# Patient Record
Sex: Female | Born: 1950 | Race: White | Hispanic: No | Smoking: Former smoker
Health system: Southern US, Community
[De-identification: ages and names within clinical notes are randomized; demographics above are authoritative.]

## PROBLEM LIST (undated history)

## (undated) DIAGNOSIS — Z8719 Personal history of other diseases of the digestive system: Secondary | ICD-10-CM

## (undated) DIAGNOSIS — I739 Peripheral vascular disease, unspecified: Secondary | ICD-10-CM

## (undated) DIAGNOSIS — M1990 Unspecified osteoarthritis, unspecified site: Secondary | ICD-10-CM

## (undated) DIAGNOSIS — I10 Essential (primary) hypertension: Secondary | ICD-10-CM

## (undated) DIAGNOSIS — B019 Varicella without complication: Secondary | ICD-10-CM

## (undated) DIAGNOSIS — Z8711 Personal history of peptic ulcer disease: Secondary | ICD-10-CM

## (undated) DIAGNOSIS — D649 Anemia, unspecified: Secondary | ICD-10-CM

## (undated) DIAGNOSIS — E1140 Type 2 diabetes mellitus with diabetic neuropathy, unspecified: Secondary | ICD-10-CM

## (undated) DIAGNOSIS — I4891 Unspecified atrial fibrillation: Secondary | ICD-10-CM

## (undated) DIAGNOSIS — E119 Type 2 diabetes mellitus without complications: Secondary | ICD-10-CM

## (undated) HISTORY — DX: Unspecified atrial fibrillation: I48.91

## (undated) HISTORY — PX: ADENOIDECTOMY: SUR15

## (undated) HISTORY — DX: Type 2 diabetes mellitus without complications: E11.9

## (undated) HISTORY — PX: OOPHORECTOMY: SHX86

## (undated) HISTORY — DX: Personal history of peptic ulcer disease: Z87.11

## (undated) HISTORY — PX: ABDOMINAL HYSTERECTOMY: SHX81

## (undated) HISTORY — DX: Peripheral vascular disease, unspecified: I73.9

## (undated) HISTORY — DX: Type 2 diabetes mellitus with diabetic neuropathy, unspecified: E11.40

## (undated) HISTORY — PX: CATARACT EXTRACTION: SUR2

## (undated) HISTORY — PX: CARDIOVERSION: SHX1299

## (undated) HISTORY — PX: FOOT SURGERY: SHX648

## (undated) HISTORY — DX: Personal history of other diseases of the digestive system: Z87.19

## (undated) HISTORY — DX: Varicella without complication: B01.9

## (undated) HISTORY — PX: ENDOVENOUS ABLATION SAPHENOUS VEIN W/ LASER: SUR449

---

## 2004-10-22 ENCOUNTER — Ambulatory Visit: Payer: Self-pay | Admitting: Podiatry

## 2005-06-24 ENCOUNTER — Ambulatory Visit: Payer: Self-pay | Admitting: Internal Medicine

## 2005-06-29 ENCOUNTER — Ambulatory Visit: Payer: Self-pay | Admitting: Internal Medicine

## 2006-08-22 ENCOUNTER — Ambulatory Visit: Payer: Self-pay | Admitting: Family

## 2007-04-23 ENCOUNTER — Ambulatory Visit: Payer: Self-pay | Admitting: Specialist

## 2007-05-04 ENCOUNTER — Ambulatory Visit: Payer: Self-pay | Admitting: Specialist

## 2007-08-02 ENCOUNTER — Ambulatory Visit: Payer: Self-pay | Admitting: Internal Medicine

## 2007-11-21 ENCOUNTER — Ambulatory Visit: Payer: Self-pay | Admitting: Internal Medicine

## 2008-10-19 ENCOUNTER — Emergency Department: Payer: Self-pay | Admitting: Emergency Medicine

## 2009-01-21 ENCOUNTER — Ambulatory Visit: Payer: Self-pay | Admitting: Internal Medicine

## 2009-05-08 ENCOUNTER — Ambulatory Visit: Payer: Self-pay | Admitting: Unknown Physician Specialty

## 2009-07-27 ENCOUNTER — Ambulatory Visit: Payer: Self-pay | Admitting: Unknown Physician Specialty

## 2009-10-28 ENCOUNTER — Ambulatory Visit: Payer: Self-pay | Admitting: Internal Medicine

## 2009-10-30 ENCOUNTER — Inpatient Hospital Stay: Payer: Self-pay | Admitting: Internal Medicine

## 2011-08-15 ENCOUNTER — Other Ambulatory Visit: Payer: Self-pay | Admitting: *Deleted

## 2011-08-15 MED ORDER — METOPROLOL TARTRATE 100 MG PO TABS: 100.0000 mg | ORAL_TABLET | Freq: Two times a day (BID) | ORAL | Status: DC

## 2011-08-15 NOTE — Telephone Encounter (Signed)
Received faxed refill request from pharmacy. Is it okay to refill metoprolol?

## 2011-09-01 ENCOUNTER — Other Ambulatory Visit: Payer: Self-pay | Admitting: Internal Medicine

## 2011-09-05 MED ORDER — INSULIN ASPART PROT & ASPART (70-30 MIX) 100 UNIT/ML ~~LOC~~ SUSP: 45.0000 [IU] | Freq: Two times a day (BID) | SUBCUTANEOUS | Status: AC

## 2012-01-06 ENCOUNTER — Other Ambulatory Visit: Payer: Self-pay | Admitting: Internal Medicine

## 2012-05-14 ENCOUNTER — Other Ambulatory Visit: Payer: Self-pay | Admitting: *Deleted

## 2012-05-14 MED ORDER — METOPROLOL TARTRATE 100 MG PO TABS: 100.0000 mg | ORAL_TABLET | Freq: Two times a day (BID) | ORAL | Status: DC

## 2012-11-02 ENCOUNTER — Ambulatory Visit: Payer: Self-pay | Admitting: Internal Medicine

## 2012-11-02 ENCOUNTER — Telehealth: Payer: Self-pay | Admitting: Internal Medicine

## 2012-11-02 NOTE — Telephone Encounter (Signed)
This pt was directed to urgent care, as she has not been seen in this office.

## 2012-11-02 NOTE — Telephone Encounter (Signed)
Patient Information:  Caller Name: [name]  Phone: [phone]  Patient: [name], [name]  Gender: Female  DOB: 11-03-1951  Age: 61 Years  PCP: Duncan Dull (Adults only)   Symptoms  Reason For Call & Symptoms: has had sinus infection/sx for 1 week, asking for a Zpac, has soreness around right eye  Reviewed Health History In EMR: Yes  Reviewed Medications In EMR: Yes  Reviewed Allergies In EMR: Yes  Reviewed Surgeries / Procedures: Yes  Date of Onset of Symptoms: 10/26/2012  Treatments Tried: OTC  Treatments Tried Worked: No  Guideline(s) Used:  Sinus Pain and Congestion  Disposition Per Guideline:   See Today in Office  Reason For Disposition Reached:   [place]ar[place]  Advice Given:  N/A  Office Follow Up:  Does the office need to follow up with this patient?: No  Instructions For The Office: N/A  Appointment Scheduled:  11/02/2012 15:30:00

## 2012-11-02 NOTE — Telephone Encounter (Signed)
FYI, Pt has appt at 3:30

## 2012-11-09 ENCOUNTER — Other Ambulatory Visit: Payer: Self-pay

## 2012-11-15 ENCOUNTER — Other Ambulatory Visit: Payer: Self-pay

## 2012-11-16 ENCOUNTER — Other Ambulatory Visit: Payer: Self-pay | Admitting: Internal Medicine

## 2014-08-29 ENCOUNTER — Telehealth: Payer: Self-pay

## 2014-08-29 NOTE — Telephone Encounter (Signed)
The patient called and stated she has an appointment with her eye doctor, but needs a [employer] ref to go see this doctor.  I explained to the patient that she was not a current patient here (after reviewing her chart) but i told her I would try to submit a form to [employer] to allow her to see the eye doctor.   [employer] form faxed in.

## 2015-06-15 LAB — HM DIABETES EYE EXAM

## 2015-07-13 ENCOUNTER — Encounter: Payer: Self-pay | Admitting: *Deleted

## 2015-07-13 DIAGNOSIS — Z91040 Latex allergy status: Secondary | ICD-10-CM | POA: Diagnosis not present

## 2015-07-13 DIAGNOSIS — E780 Pure hypercholesterolemia: Secondary | ICD-10-CM | POA: Diagnosis not present

## 2015-07-13 DIAGNOSIS — Z791 Long term (current) use of non-steroidal anti-inflammatories (NSAID): Secondary | ICD-10-CM | POA: Diagnosis not present

## 2015-07-13 DIAGNOSIS — M1990 Unspecified osteoarthritis, unspecified site: Secondary | ICD-10-CM | POA: Diagnosis not present

## 2015-07-13 DIAGNOSIS — I4891 Unspecified atrial fibrillation: Secondary | ICD-10-CM | POA: Diagnosis not present

## 2015-07-13 DIAGNOSIS — H9190 Unspecified hearing loss, unspecified ear: Secondary | ICD-10-CM | POA: Diagnosis not present

## 2015-07-13 DIAGNOSIS — Z87891 Personal history of nicotine dependence: Secondary | ICD-10-CM | POA: Diagnosis not present

## 2015-07-13 DIAGNOSIS — H2512 Age-related nuclear cataract, left eye: Secondary | ICD-10-CM | POA: Diagnosis present

## 2015-07-13 DIAGNOSIS — Z79899 Other long term (current) drug therapy: Secondary | ICD-10-CM | POA: Diagnosis not present

## 2015-07-13 DIAGNOSIS — I10 Essential (primary) hypertension: Secondary | ICD-10-CM | POA: Diagnosis not present

## 2015-07-13 DIAGNOSIS — Z9889 Other specified postprocedural states: Secondary | ICD-10-CM | POA: Diagnosis not present

## 2015-07-13 DIAGNOSIS — E119 Type 2 diabetes mellitus without complications: Secondary | ICD-10-CM | POA: Diagnosis not present

## 2015-07-13 DIAGNOSIS — G629 Polyneuropathy, unspecified: Secondary | ICD-10-CM | POA: Diagnosis not present

## 2015-07-13 DIAGNOSIS — Z90710 Acquired absence of both cervix and uterus: Secondary | ICD-10-CM | POA: Diagnosis not present

## 2015-07-13 DIAGNOSIS — Z974 Presence of external hearing-aid: Secondary | ICD-10-CM | POA: Diagnosis not present

## 2015-07-13 DIAGNOSIS — D649 Anemia, unspecified: Secondary | ICD-10-CM | POA: Diagnosis not present

## 2015-07-13 DIAGNOSIS — Z885 Allergy status to narcotic agent status: Secondary | ICD-10-CM | POA: Diagnosis not present

## 2015-07-16 ENCOUNTER — Ambulatory Visit: Payer: Medicare Other | Admitting: Anesthesiology

## 2015-07-16 ENCOUNTER — Encounter
Admission: RE | Disposition: A | Discharge status: Home / Self Care | Payer: Self-pay | Source: Ambulatory Visit | Attending: Ophthalmology

## 2015-07-16 ENCOUNTER — Telehealth: Payer: Self-pay

## 2015-07-16 ENCOUNTER — Ambulatory Visit
Admission: RE | Admit: 2015-07-16 | Discharge: 2015-07-16 | Disposition: A | Discharge status: Home / Self Care | Payer: Medicare Other | Source: Ambulatory Visit | Attending: Ophthalmology | Admitting: Ophthalmology

## 2015-07-16 ENCOUNTER — Encounter: Payer: Self-pay | Admitting: Anesthesiology

## 2015-07-16 DIAGNOSIS — I10 Essential (primary) hypertension: Secondary | ICD-10-CM | POA: Insufficient documentation

## 2015-07-16 DIAGNOSIS — I4891 Unspecified atrial fibrillation: Secondary | ICD-10-CM | POA: Insufficient documentation

## 2015-07-16 DIAGNOSIS — Z885 Allergy status to narcotic agent status: Secondary | ICD-10-CM | POA: Insufficient documentation

## 2015-07-16 DIAGNOSIS — D649 Anemia, unspecified: Secondary | ICD-10-CM | POA: Insufficient documentation

## 2015-07-16 DIAGNOSIS — E119 Type 2 diabetes mellitus without complications: Secondary | ICD-10-CM | POA: Insufficient documentation

## 2015-07-16 DIAGNOSIS — G629 Polyneuropathy, unspecified: Secondary | ICD-10-CM | POA: Insufficient documentation

## 2015-07-16 DIAGNOSIS — Z87891 Personal history of nicotine dependence: Secondary | ICD-10-CM | POA: Insufficient documentation

## 2015-07-16 DIAGNOSIS — Z91040 Latex allergy status: Secondary | ICD-10-CM | POA: Insufficient documentation

## 2015-07-16 DIAGNOSIS — Z90710 Acquired absence of both cervix and uterus: Secondary | ICD-10-CM | POA: Insufficient documentation

## 2015-07-16 DIAGNOSIS — Z791 Long term (current) use of non-steroidal anti-inflammatories (NSAID): Secondary | ICD-10-CM | POA: Insufficient documentation

## 2015-07-16 DIAGNOSIS — Z79899 Other long term (current) drug therapy: Secondary | ICD-10-CM | POA: Insufficient documentation

## 2015-07-16 DIAGNOSIS — H2512 Age-related nuclear cataract, left eye: Secondary | ICD-10-CM | POA: Diagnosis not present

## 2015-07-16 DIAGNOSIS — H9190 Unspecified hearing loss, unspecified ear: Secondary | ICD-10-CM | POA: Insufficient documentation

## 2015-07-16 DIAGNOSIS — Z9889 Other specified postprocedural states: Secondary | ICD-10-CM | POA: Insufficient documentation

## 2015-07-16 DIAGNOSIS — M1990 Unspecified osteoarthritis, unspecified site: Secondary | ICD-10-CM | POA: Insufficient documentation

## 2015-07-16 DIAGNOSIS — E780 Pure hypercholesterolemia: Secondary | ICD-10-CM | POA: Insufficient documentation

## 2015-07-16 DIAGNOSIS — Z974 Presence of external hearing-aid: Secondary | ICD-10-CM | POA: Insufficient documentation

## 2015-07-16 HISTORY — DX: Essential (primary) hypertension: I10

## 2015-07-16 HISTORY — PX: CATARACT EXTRACTION W/PHACO: SHX586

## 2015-07-16 HISTORY — DX: Unspecified osteoarthritis, unspecified site: M19.90

## 2015-07-16 HISTORY — DX: Anemia, unspecified: D64.9

## 2015-07-16 LAB — GLUCOSE, CAPILLARY
GLUCOSE-CAPILLARY: 330 mg/dL — AB (ref 65–99)
Glucose-Capillary: 346 mg/dL — ABNORMAL HIGH (ref 65–99)
Glucose-Capillary: 372 mg/dL — ABNORMAL HIGH (ref 65–99)

## 2015-07-16 SURGERY — PHACOEMULSIFICATION, CATARACT, WITH IOL INSERTION
Anesthesia: Monitor Anesthesia Care | Site: Eye | Laterality: Left | Wound class: Clean

## 2015-07-16 MED ORDER — CEFUROXIME OPHTHALMIC INJECTION 1 MG/0.1 ML
INJECTION | OPHTHALMIC | Status: AC
  Filled 2015-07-16: qty 0.1

## 2015-07-16 MED ORDER — MIDAZOLAM HCL 2 MG/2ML IJ SOLN
INTRAMUSCULAR | Status: DC | PRN
  Administered 2015-07-16: 1 mg via INTRAVENOUS

## 2015-07-16 MED ORDER — SODIUM CHLORIDE 0.9 % IV SOLN
INTRAVENOUS | Status: DC
  Administered 2015-07-16: 06:00:00 via INTRAVENOUS

## 2015-07-16 MED ORDER — INSULIN ASPART 100 UNIT/ML ~~LOC~~ SOLN: 4.0000 [IU] | Freq: Once | SUBCUTANEOUS | Status: DC

## 2015-07-16 MED ORDER — FENTANYL CITRATE (PF) 100 MCG/2ML IJ SOLN
INTRAMUSCULAR | Status: DC | PRN
  Administered 2015-07-16: 50 ug via INTRAVENOUS

## 2015-07-16 MED ORDER — TETRACAINE HCL 0.5 % OP SOLN
1.0000 [drp] | OPHTHALMIC | Status: DC | PRN
  Administered 2015-07-16: 1 [drp] via OPHTHALMIC

## 2015-07-16 MED ORDER — TETRACAINE HCL 0.5 % OP SOLN
OPHTHALMIC | Status: AC
  Administered 2015-07-16: 1 [drp] via OPHTHALMIC
  Filled 2015-07-16: qty 2

## 2015-07-16 MED ORDER — INSULIN REGULAR HUMAN 100 UNIT/ML IJ SOLN: 4.0000 [IU] | Freq: Once | INTRAMUSCULAR | Status: DC

## 2015-07-16 MED ORDER — TRYPAN BLUE 0.06 % OP SOLN
OPHTHALMIC | Status: AC
  Filled 2015-07-16: qty 0.5

## 2015-07-16 MED ORDER — LIDOCAINE HCL (PF) 4 % IJ SOLN
INTRAMUSCULAR | Status: AC
  Filled 2015-07-16: qty 5

## 2015-07-16 MED ORDER — MOXIFLOXACIN HCL 0.5 % OP SOLN
OPHTHALMIC | Status: AC
  Administered 2015-07-16: 1 [drp] via OPHTHALMIC
  Filled 2015-07-16: qty 3

## 2015-07-16 MED ORDER — MOXIFLOXACIN HCL 0.5 % OP SOLN
1.0000 [drp] | OPHTHALMIC | Status: AC | PRN
  Administered 2015-07-16 (×3): 1 [drp] via OPHTHALMIC

## 2015-07-16 MED ORDER — CEFUROXIME OPHTHALMIC INJECTION 1 MG/0.1 ML
INJECTION | OPHTHALMIC | Status: DC | PRN
  Administered 2015-07-16: 0.1 mL via INTRACAMERAL

## 2015-07-16 MED ORDER — CYCLOPENTOLATE HCL 2 % OP SOLN
1.0000 [drp] | OPHTHALMIC | Status: AC | PRN
  Administered 2015-07-16 (×4): 1 [drp] via OPHTHALMIC

## 2015-07-16 MED ORDER — BALANCED SALT IO SOLN
INTRAOCULAR | Status: DC | PRN
  Administered 2015-07-16: 200 mL via INTRAOCULAR

## 2015-07-16 MED ORDER — EPINEPHRINE HCL 1 MG/ML IJ SOLN
INTRAMUSCULAR | Status: AC
  Filled 2015-07-16: qty 1

## 2015-07-16 MED ORDER — CYCLOPENTOLATE HCL 2 % OP SOLN
OPHTHALMIC | Status: AC
  Administered 2015-07-16: 1 [drp] via OPHTHALMIC
  Filled 2015-07-16: qty 2

## 2015-07-16 MED ORDER — EPINEPHRINE HCL 1 MG/ML IJ SOLN
INTRAMUSCULAR | Status: DC | PRN
  Administered 2015-07-16: 4 mL via OPHTHALMIC

## 2015-07-16 MED ORDER — PHENYLEPHRINE HCL 10 % OP SOLN
1.0000 [drp] | OPHTHALMIC | Status: AC | PRN
  Administered 2015-07-16 (×4): 1 [drp] via OPHTHALMIC

## 2015-07-16 MED ORDER — PHENYLEPHRINE HCL 10 % OP SOLN
OPHTHALMIC | Status: AC
  Administered 2015-07-16: 1 [drp] via OPHTHALMIC
  Filled 2015-07-16: qty 5

## 2015-07-16 MED ORDER — INSULIN ASPART 100 UNIT/ML ~~LOC~~ SOLN
SUBCUTANEOUS | Status: AC
  Administered 2015-07-16: 4 [IU]
  Filled 2015-07-16: qty 1

## 2015-07-16 MED ORDER — MOXIFLOXACIN HCL 0.5 % OP SOLN
OPHTHALMIC | Status: DC | PRN
  Administered 2015-07-16: 1 [drp] via OPHTHALMIC

## 2015-07-16 MED ORDER — NA HYALUR & NA CHOND-NA HYALUR 0.4-0.35 ML IO KIT
PACK | INTRAOCULAR | Status: DC | PRN
  Administered 2015-07-16: .75 mL via INTRAOCULAR

## 2015-07-16 MED ORDER — NA HYALUR & NA CHOND-NA HYALUR 0.55-0.5 ML IO KIT
PACK | INTRAOCULAR | Status: AC
  Filled 2015-07-16: qty 1.05

## 2015-07-16 MED ORDER — INSULIN ASPART 100 UNIT/ML ~~LOC~~ SOLN
SUBCUTANEOUS | Status: AC
  Administered 2015-07-16: 8 [IU] via SUBCUTANEOUS
  Filled 2015-07-16: qty 8

## 2015-07-16 MED ORDER — INSULIN ASPART 100 UNIT/ML ~~LOC~~ SOLN
8.0000 [IU] | Freq: Once | SUBCUTANEOUS | Status: AC
Start: 2015-07-16 — End: 2015-07-16
  Administered 2015-07-16: 8 [IU] via SUBCUTANEOUS

## 2015-07-16 SURGICAL SUPPLY — 22 items
CANNULA ANT/CHMB 27GA (MISCELLANEOUS) ×3 IMPLANT
CUP MEDICINE 2OZ PLAST GRAD ST (MISCELLANEOUS) ×6 IMPLANT
GLOVE BIO SURGEON STRL SZ7 (GLOVE) ×3 IMPLANT
GLOVE SURG LX 6.5 MICRO (GLOVE) ×4
GLOVE SURG LX STRL 6.5 MICRO (GLOVE) ×2 IMPLANT
GOWN STRL REUS W/ TWL LRG LVL3 (GOWN DISPOSABLE) ×2 IMPLANT
GOWN STRL REUS W/TWL LRG LVL3 (GOWN DISPOSABLE) ×4
LENS IOL ACRSF IQ PC 18.0 (Intraocular Lens) ×1 IMPLANT
LENS IOL ACRYSOF IQ POST 18.0 (Intraocular Lens) ×3 IMPLANT
NEEDLE FILTER BLUNT 18X 1/2SAF (NEEDLE) ×2
NEEDLE FILTER BLUNT 18X1 1/2 (NEEDLE) ×1 IMPLANT
PACK CATARACT (MISCELLANEOUS) ×3 IMPLANT
PACK CATARACT BRASINGTON LX (MISCELLANEOUS) ×3 IMPLANT
PACK EYE AFTER SURG (MISCELLANEOUS) ×3 IMPLANT
SOL BSS BAG (MISCELLANEOUS) ×3
SOL PREP PVP 2OZ (MISCELLANEOUS) ×3
SOLUTION BSS BAG (MISCELLANEOUS) ×1 IMPLANT
SOLUTION PREP PVP 2OZ (MISCELLANEOUS) ×1 IMPLANT
SYR 3ML LL SCALE MARK (SYRINGE) ×6 IMPLANT
SYR TB 1ML 27GX1/2 LL (SYRINGE) ×3 IMPLANT
WATER STERILE IRR 1000ML POUR (IV SOLUTION) ×3 IMPLANT
WIPE NON LINTING 3.25X3.25 (MISCELLANEOUS) ×3 IMPLANT

## 2015-07-16 NOTE — Anesthesia Postprocedure Evaluation (Cosign Needed Addendum)
  Anesthesia Post-op Note  Patient: [name]  Procedure(s) Performed: Procedure(s) with comments: CATARACT EXTRACTION PHACO AND INTRAOCULAR LENS PLACEMENT (IOC) (Left) - [place]: 01:16.4 AP%: 12.7 CDE: 9.68 Fluid lot# [id number]  Anesthesia type:MAC  Patient location: PACU  Post pain: Pain level controlled  Post assessment: Post-op Vital signs reviewed, Patient's Cardiovascular Status Stable, Respiratory Function Stable, Patent Airway and No signs of Nausea or vomiting  Post vital signs: Reviewed and stable  Last Vitals:  Filed Vitals:   07/16/15 0809  BP: 136/67  Pulse: 53  Temp: 36.9 C  Resp: 16    Level of consciousness: awake, alert  and patient cooperative  Complications: No apparent anesthesia complications

## 2015-07-16 NOTE — Telephone Encounter (Addendum)
Pt was at [facility] for Cataract surgery. The nurse informed me that patient had  stopped taking her metformin because she thought she wasn't diabetic anymore. 1st reading was 372 nurse gave her 8 units of Humalog and it brought it down to 348. The nurse took a third reading and it was 330 so patient was giving 4 units of humalog. Nurse wanted to know if we could call in her metformin. She is not popping up with you has PCP, she does have two charts.  Please advise how to proceed with this medication refill.?

## 2015-07-16 NOTE — Transfer of Care (Signed)
Immediate Anesthesia Transfer of Care Note  Patient: [name]  Procedure(s) Performed: Procedure(s) with comments: CATARACT EXTRACTION PHACO AND INTRAOCULAR LENS PLACEMENT (IOC) (Left) - [place]: 01:16.4 AP%: 12.7 CDE: 9.68 Fluid lot# [id number]  Patient Location: PACU  Anesthesia Type:MAC  Level of Consciousness: awake, alert  and oriented  Airway & Oxygen Therapy: Patient Spontanous Breathing  Post-op Assessment: Report given to RN and Post -op Vital signs reviewed and stable  Post vital signs: Reviewed and stable  Last Vitals:  Filed Vitals:   07/16/15 0809  BP: 136/67  Pulse: 53  Temp: 36.9 C  Resp: 16    Complications: No apparent anesthesia complications

## 2015-07-16 NOTE — OR Nursing (Signed)
Dr. Oren Section notified of FSBS of 330.  humalog 4 units ordered.  Given in left upper arm.  Dr. Lupita Dawn office notified of BS reading

## 2015-07-16 NOTE — Discharge Instructions (Signed)
POST OPERATIVE INSTRUCTIONS  DAY OF CATARACT SURGERY Your surgery went well.  Today, take it easy and protect the eye.  Dont bend over at the waist. Dont lift objects heavier than a jug of milk (10lbs). Dont let anything get in the eye other than the drops we give you. Keep the eye shield on at all times except to put in the drops. You may have a mild headache, soreness or scratchy sensation after surgery. EYE DROPS AFTER SURGERY          Durezol Steroid (SHAKE WELL) Ilevro Anti-inflammatory Vigamox Antibiotic       2 times a day Once daily 4 times a day      Wait 5 minutes between drops  If you have questions, call [facility] We will see you tomorrow in the eye clinic.  Eye Surgery Discharge Instructions  Expect mild scratchy sensation or mild soreness. DO NOT RUB YOUR EYE!  The day of surgery:  Minimal physical activity, but bed rest is not required  No reading, computer work, or close hand work  No bending, lifting, or straining.  May watch TV  For 24 hours:  No driving, legal decisions, or alcoholic beverages  Safety precautions  Eat anything you prefer: It is better to start with liquids, then soup then solid foods.  _____ Eye patch should be worn until postoperative exam tomorrow.  ____ Solar shield eyeglasses should be worn for comfort in the sunlight/patch while sleeping  Resume all regular medications including aspirin or Coumadin if these were discontinued prior to surgery. You may shower, bathe, shave, or wash your hair. Tylenol may be taken for mild discomfort.  Call your doctor if you experience significant pain, nausea, or vomiting, fever > 101 or other signs of infection. [phone] or [phone] Specific instructions:  Follow-up Information    Follow up with Lyla Glassing, MD.   Specialty:  Ophthalmology   Why:  10:15   Contact information:   [address] [place] [address] [phone]

## 2015-07-16 NOTE — Telephone Encounter (Signed)
Pt was called to let her know Dr.Tullo will not be able to refill her medication due to her not being her patient. Was going to see if she wanted to be seen by our new providers.

## 2015-07-16 NOTE — Telephone Encounter (Signed)
A bsolutely not,  She may have been my patinet 5 years ago, bur she has not established care with me.  You can set her up with one of the new docs ASAP

## 2015-07-16 NOTE — Anesthesia Preprocedure Evaluation (Signed)
Anesthesia Evaluation  Patient identified by MRN, date of birth, ID band Patient awake    Reviewed: Allergy & Precautions, NPO status , Patient's Chart, lab work & pertinent test results  Airway Mallampati: III  TM Distance: <3 FB Neck ROM: Full    Dental  (+) Chipped   Pulmonary sleep apnea , former smoker,  breath sounds clear to auscultation  Pulmonary exam normal       Cardiovascular hypertension, Normal cardiovascular exam+ dysrhythmias Atrial Fibrillation     Neuro/Psych neuropathy negative psych ROS   GI/Hepatic Neg liver ROS, hiatal hernia,   Endo/Other  diabetes, Well Controlled, Type 2  Renal/GU negative Renal ROS     Musculoskeletal  (+) Arthritis -, Osteoarthritis,    Abdominal Normal abdominal exam  (+)   Peds negative pediatric ROS (+)  Hematology  (+) anemia ,   Anesthesia Other Findings   Reproductive/Obstetrics                             Anesthesia Physical Anesthesia Plan  ASA: III  Anesthesia Plan: MAC   Post-op Pain Management:    Induction: Intravenous  Airway Management Planned: Nasal Cannula  Additional Equipment:   Intra-op Plan:   Post-operative Plan:   Informed Consent: I have reviewed the patients History and Physical, chart, labs and discussed the procedure including the risks, benefits and alternatives for the proposed anesthesia with the patient or authorized representative who has indicated his/her understanding and acceptance.   Dental advisory given  Plan Discussed with: CRNA and Surgeon  Anesthesia Plan Comments:         Anesthesia Quick Evaluation

## 2015-07-16 NOTE — OR Nursing (Signed)
Dr Kayleen Memos notified of pt elevated CBG of 372at admission, rechecked and was 348.  8u Novolog given SQ per order dr Kayleen Memos at [phone],  Surgeon and  CRNA aware.

## 2015-07-16 NOTE — Anesthesia Procedure Notes (Signed)
Procedure Name: MAC Performed by: Caleah Tortorelli Pre-anesthesia Checklist: Patient identified, Emergency Drugs available, Suction available, Patient being monitored and Timeout performed Oxygen Delivery Method: Nasal cannula       

## 2015-07-16 NOTE — Op Note (Signed)
07/16/2015  PRE-OP DIAGNOSIS: Cataract (ICD-10 H25.12) Nuclear sclerotic cataract, LEFT EYE  Post operative diagnosis: Cataract (ICD-10 H25.12) Nuclear sclerotic cataract, LEFT EYE  Procedure: Phacoemulsification with introcular lens [id number](73710)   SURGEON: Surgeon(s) and Role:    * Lyla Glassing, MD - Primary  ANESTHESIA: Topical   ESTIMATED BLOOD LOSS: MINIMAL  COMPLICATIONS: None  OPERATIVE DESCRIPTION:  Therapeutic options were discussed with the patient preoperatively, including a discussion of risks and benefits of surgery.  Informed consent was obtained. A dilated fundus exam was performed within 6 months.   The patient was premedicated and brought to the operating room and placed on the operating table in the supine position.  Topical tetracaine was instilled.  After adequate anesthesia, the patient was prepped and draped in the usual fashion.  A wire lid speculum was inserted and the microscope was positioned.  A sideport was used to create a paracentesis site and a mixture of preservative-free lidocaine, BSS, and epinephrine was was instilled into the anterior chamber, followed by viscoelastic.  A clear corneal incision was created using a keratome blade.  Capsulorrhexis was then performed.  In situ phacoemulsification was performed.  Cortical material was removed with the irrigation-aspiration unit.  Viscoelastic was instilled to open the capsular bag.  A posterior chamber intraocular lens, model 18.0 diopters, was inserted and positioned.  Irrigation-aspiration was used to remove all viscoelastic. Intracameral cefuroxime was injected into the eye. Wounds were checked for leakage and confirmed to be secure.  Lid speculum was removed and a shield was placed over the eye.  Patient was returned to the recovery room in stable condition.  IMPLANTS:   Implant Name Type Inv. Item Serial No. Manufacturer Lot No. LRB No. Used  IMPLANT LENS - [id number] Intraocular Lens IMPLANT LENS  [id number] ALCON   Left 1     Postoperative care and discharge medication counseling was discussed with the patient or the parents prior to discharge

## 2015-07-16 NOTE — Addendum Note (Signed)
Addendum  created 07/16/15 4037 by Demetrius Charity, CRNA   Modules edited: Anesthesia Flowsheet

## 2015-07-16 NOTE — OR Nursing (Signed)
Son instructed to call Dr. Lupita Dawn office back by 3:00 if they do not hear from the office.  It is imperative that her diabetes will be addressed.  Dr[facility] Charlann Boxer [facility].

## 2015-07-16 NOTE — H&P (Signed)
The history and physical was faxed to the hospital. The history and physical was reviewed by me and no changes have occurred.   

## 2015-07-17 ENCOUNTER — Telehealth: Payer: Self-pay | Admitting: Internal Medicine

## 2015-07-17 LAB — HM DIABETES EYE EXAM

## 2015-07-17 NOTE — Telephone Encounter (Signed)
Pt informed. Appt made to reestablish, as long as you agree. If not will call pt back and inform of same. Pls advise.

## 2015-07-17 NOTE — Telephone Encounter (Signed)
It would be my pleasure to call the pt back and schedule with one of the other new providers. Thank you so much for your time and attention to this message.

## 2015-07-17 NOTE — Telephone Encounter (Signed)
I am closed to new patients.  Have been for over a year, and I do not recall being asked to accept her as a new patient.  I have been at this practice for four years, sowhether or not she was my patient 4 years ago is not relevant.   We have two new doctors in [place] practice that need patients.  I would prefer that she establish with them

## 2015-07-17 NOTE — Telephone Encounter (Signed)
Pt has been rescheduled (tentatively) with Dr. Lacinda Axon for 08/07/15 at 11am. Left message for pt to call back for additional scheduling details.

## 2015-07-17 NOTE — Telephone Encounter (Signed)
Pt is having some trouble with her Diabetes count and wanted to know if a prescription for Metformin called be called in. Pharmacy is [facility] on [address] of [address] [phone]. Thank You

## 2015-07-17 NOTE — Telephone Encounter (Signed)
Thank you Stefannie!

## 2015-07-17 NOTE — Telephone Encounter (Signed)
I have never seen this patient and cannot prescribe medications until I see her.

## 2015-07-22 ENCOUNTER — Encounter (INDEPENDENT_AMBULATORY_CARE_PROVIDER_SITE_OTHER): Payer: Self-pay

## 2015-07-22 ENCOUNTER — Ambulatory Visit (INDEPENDENT_AMBULATORY_CARE_PROVIDER_SITE_OTHER): Payer: Medicare Other | Admitting: Family Medicine

## 2015-07-22 ENCOUNTER — Encounter: Payer: Self-pay | Admitting: Family Medicine

## 2015-07-22 ENCOUNTER — Telehealth: Payer: Self-pay | Admitting: *Deleted

## 2015-07-22 VITALS — BP 110/70 | HR 71 | Temp 99.7°F | Ht 62.75 in | Wt 145.4 lb

## 2015-07-22 DIAGNOSIS — E1140 Type 2 diabetes mellitus with diabetic neuropathy, unspecified: Secondary | ICD-10-CM

## 2015-07-22 DIAGNOSIS — I4891 Unspecified atrial fibrillation: Secondary | ICD-10-CM | POA: Diagnosis not present

## 2015-07-22 DIAGNOSIS — Z1239 Encounter for other screening for malignant neoplasm of breast: Secondary | ICD-10-CM | POA: Diagnosis not present

## 2015-07-22 DIAGNOSIS — E1165 Type 2 diabetes mellitus with hyperglycemia: Secondary | ICD-10-CM

## 2015-07-22 DIAGNOSIS — Z79899 Other long term (current) drug therapy: Secondary | ICD-10-CM | POA: Diagnosis not present

## 2015-07-22 DIAGNOSIS — IMO0002 Reserved for concepts with insufficient information to code with codable children: Secondary | ICD-10-CM

## 2015-07-22 DIAGNOSIS — E1149 Type 2 diabetes mellitus with other diabetic neurological complication: Secondary | ICD-10-CM | POA: Insufficient documentation

## 2015-07-22 DIAGNOSIS — Z0000 Encounter for general adult medical examination without abnormal findings: Secondary | ICD-10-CM

## 2015-07-22 DIAGNOSIS — Z87891 Personal history of nicotine dependence: Secondary | ICD-10-CM

## 2015-07-22 LAB — COMPREHENSIVE METABOLIC PANEL
ALT: 18 U/L (ref 0–35)
AST: 21 U/L (ref 0–37)
Albumin: 4.1 g/dL (ref 3.5–5.2)
Alkaline Phosphatase: 73 U/L (ref 39–117)
BUN: 18 mg/dL (ref 6–23)
CALCIUM: 9.9 mg/dL (ref 8.4–10.5)
CHLORIDE: 94 meq/L — AB (ref 96–112)
CO2: 26 meq/L (ref 19–32)
Creatinine, Ser: 0.71 mg/dL (ref 0.40–1.20)
GFR: 87.55 mL/min (ref >60.00)
Glucose, Bld: 378 mg/dL — ABNORMAL HIGH (ref 70–99)
Potassium: 5.2 mEq/L — ABNORMAL HIGH (ref 3.5–5.1)
Sodium: 129 mEq/L — ABNORMAL LOW (ref 135–145)
Total Bilirubin: 0.5 mg/dL (ref 0.2–1.2)
Total Protein: 7.1 g/dL (ref 6.0–8.3)

## 2015-07-22 LAB — LIPID PANEL
CHOL/HDL RATIO: 8
Cholesterol: 273 mg/dL — ABNORMAL HIGH (ref 0–200)
HDL: 34.5 mg/dL — AB (ref >39.00)
NONHDL: 238.47
TRIGLYCERIDES: 390 mg/dL — AB (ref 0.0–149.0)
VLDL: 78 mg/dL — ABNORMAL HIGH (ref 0.0–40.0)

## 2015-07-22 LAB — CBC
HCT: 41.2 % (ref 36.0–46.0)
Hemoglobin: 13.7 g/dL (ref 12.0–15.0)
MCHC: 33.3 g/dL (ref 30.0–36.0)
MCV: 81.2 fl (ref 78.0–100.0)
PLATELETS: 295 10*3/uL (ref 150.0–400.0)
RBC: 5.07 Mil/uL (ref 3.87–5.11)
RDW: 12.7 % (ref 11.5–15.5)
WBC: 23.1 10*3/uL (ref 4.0–10.5)

## 2015-07-22 LAB — HEMOGLOBIN A1C: Hgb A1c MFr Bld: 12.1 % — ABNORMAL HIGH (ref 4.6–6.5)

## 2015-07-22 LAB — LDL CHOLESTEROL, DIRECT: Direct LDL: 167 mg/dL

## 2015-07-22 MED ORDER — TETANUS-DIPHTH-ACELL PERTUSSIS 5-2.5-18.5 LF-MCG/0.5 IM SUSP: 0.5000 mL | Freq: Once | INTRAMUSCULAR | Status: DC

## 2015-07-22 MED ORDER — ZOSTER VACCINE LIVE 19400 UNT/0.65ML ~~LOC~~ SOLR: 0.6500 mL | Freq: Once | SUBCUTANEOUS | Status: DC

## 2015-07-22 MED ORDER — GABAPENTIN 100 MG PO CAPS: 100.0000 mg | ORAL_CAPSULE | Freq: Three times a day (TID) | ORAL | Status: DC

## 2015-07-22 NOTE — Progress Notes (Signed)
Pre visit review using our clinic review tool, if applicable. No additional management support is needed unless otherwise documented below in the visit note. 

## 2015-07-22 NOTE — Assessment & Plan Note (Addendum)
Uncontrolled.  Obtaining A1C today. Will start medication once labs return (to determine severity and to check renal function. Gabapentin refilled (neuropathy)

## 2015-07-22 NOTE — Telephone Encounter (Signed)
CRITICAL   White cell count: 23.1

## 2015-07-22 NOTE — Progress Notes (Signed)
Subjective:  Patient ID: [name], female    DOB: 1951/04/11  Age: 64 y.o. MRN: [id number]  CC: Establish care/Elevated Blood sugars.  HPI [name] is a 64 y.o. female presents to the clinic today to establish care.  Patient reports recent elevated blood sugars (see below).  1) DM-2  Uncontrolled/Worsening.  Patient reports a 10 year history of diabetes.  She states that she was previously on metformin and insulin.  She is not currently on any medications and has not been on any for years.  She is concerned about her recent elevated blood sugars. She went for cataract surgery and was given insulin due to her elevated sugars.  CBG's - patient reports that her fasting blood sugars have been ranging from 214 to the low 300s (fasting).  Medications - None currently.   Preventative Healthcare up to date?  In need of - Diabetic eye exam and foot exam.  I anticipate the patient has had a eye exam as of recent given cataract surgery. Will obtain records.  2) Atrial fibrillation/? Hx of SVT  Patient reports that she had a "fast heart rate" years ago. She was seen in the ED and it sounds like she was given adenosine with resolution of her issue.  Patient has a cardiologist [facility]) and follows annually with him.  She reports that she is doing well at this time and is stable on sotalol.   Will need to obtain records from Dr. Clayborn Bigness  PMH, Surgical Hx, Family Hx, Social History reviewed and updated as below. Past Medical History  Diagnosis Date  . DM type 2 (diabetes mellitus, type 2)   . Diabetic neuropathy   . Arthritis   . Atrial fibrillation     Past Surgical History  Procedure Laterality Date  . Vaginal hysterectomy    . Foot surgery Right   . Cataract extraction      Family History  Problem Relation Age of Onset  . Arthritis Maternal Grandmother   . Arthritis Mother   . Arthritis Father   . Hyperlipidemia Mother   . Hyperlipidemia Father   .  Hypertension Mother   . Hypertension Father   . Diabetes Mother   . Diabetes Father     Social History  Substance Use Topics  . Smoking status: Former Smoker    Quit date: 07/21/2009  . Smokeless tobacco: Never Used  . Alcohol Use: 0.0 - 0.6 oz/week    0-1 Standard drinks or equivalent per week    Review of Systems  Constitutional: Negative for fever and chills.  HENT: Negative for congestion, rhinorrhea and sore throat.   Eyes: Negative for pain and visual disturbance.  Respiratory: Negative for cough and shortness of breath.   Cardiovascular: Negative for chest pain.  Gastrointestinal: Negative for nausea, vomiting, abdominal pain, diarrhea and constipation.  Endocrine: Negative for polydipsia and polyuria.  Genitourinary: Negative for dysuria, urgency and frequency.  Musculoskeletal: Negative for arthralgias.  Skin: Negative for rash.  Neurological: Negative for dizziness and light-headedness.    Objective:   Today's Vitals: BP 110/70 mmHg  Pulse 71  Temp(Src) 99.7 F (37.6 C) (Oral)  Ht 5' 2.75" (1.594 m)  Wt 145 lb 6 oz (65.942 kg)  BMI 25.95 kg/m2  SpO2 96%  Physical Exam  Constitutional: She is oriented to person, place, and time. She appears well-developed and well-nourished. No distress.  HENT:  Head: Normocephalic and atraumatic.  Mouth/Throat: No oropharyngeal exudate.  Eyes: Pupils are equal, round, and  reactive to light. No scleral icterus.  Neck: Neck supple.  Cardiovascular: Normal rate and regular rhythm.   No murmur heard. Pulmonary/Chest: Effort normal and breath sounds normal. No respiratory distress. She has no wheezes. She has no rales.  Abdominal: Soft. She exhibits no distension. There is no tenderness. There is no rebound and no guarding.  Lymphadenopathy:    She has no cervical adenopathy.  Neurological: She is alert and oriented to person, place, and time. No cranial nerve deficit.  Skin: Skin is warm and dry.  Psychiatric: She has a  normal mood and affect.  Vitals reviewed.   Assessment & Plan:   Problem List Items Addressed This Visit    Atrial fibrillation    Stable. Continue Sotalol. Will obtain records from Cardiology.      Relevant Medications   sotalol (BETAPACE) 80 MG tablet   aspirin 81 MG tablet   Former smoker   Preventative health care    Obtaining labs today: CBC, CMP, A1c, lipid. Patient declined HIV screening, hepatitis C screening, DEXA scan. Prescriptions given for Zostavax and Tdap. Patient will need pneumococcal vaccine on at follow-up. Will schedule mammogram and will place referral for colonoscopy. Diabetic foot exam performed today. Patient will need urine microalbumin at follow-up. Will obtain records regarding eye exam.        Type 2 diabetes, uncontrolled, with neuropathy - Primary    Uncontrolled.  Obtaining A1C today. Will start medication once labs return (to determine severity and to check renal function.      Relevant Medications   aspirin 81 MG tablet   Other Relevant Orders   Hemoglobin A1c   Comprehensive metabolic panel   Lipid Profile    Other Visit Diagnoses    Encounter for long-term current use of medication        Relevant Orders    CBC    Screening for breast cancer        Relevant Orders    MM DIGITAL SCREENING BILATERAL       Outpatient Encounter Prescriptions as of 07/22/2015  Medication Sig  . acetaminophen (TYLENOL) 500 MG tablet Take 500 mg by mouth every 6 (six) hours as needed.  Marland Kitchen aspirin 81 MG tablet Take 81 mg by mouth 2 (two) times daily.  . Biotin 2500 MCG CAPS Take 2,500 capsules by mouth daily.  . diphenhydrAMINE (SOMINEX) 25 MG tablet Take 25 mg by mouth 2 (two) times daily.  Marland Kitchen gabapentin (NEURONTIN) 100 MG capsule Take 1 capsule (100 mg total) by mouth 3 (three) times daily.  Marland Kitchen ibuprofen (ADVIL,MOTRIN) 200 MG tablet Take 200 mg by mouth every 6 (six) hours as needed.  . milk thistle 175 MG tablet Take 175 mg by mouth 3 (three) times  daily.  . Omega 3-6-9 Fatty Acids (TRIPLE OMEGA-3-6-9) CAPS Take 3,600 capsules by mouth 3 (three) times daily.  . pravastatin (PRAVACHOL) 40 MG tablet TAKE 1 TABLET DAILY  . pyridOXINE (VITAMIN B-6) 100 MG tablet Take 100 mg by mouth daily.  . sotalol (BETAPACE) 80 MG tablet Take 80 mg by mouth 2 (two) times daily.  . vitamin B-12 (CYANOCOBALAMIN) 1000 MCG tablet Take 1,000 mcg by mouth 2 (two) times daily.  . vitamin E 400 UNIT capsule Take 400 Units by mouth 2 (two) times daily.  . [DISCONTINUED] gabapentin (NEURONTIN) 100 MG capsule Take 100 mg by mouth 4 (four) times daily.  . [DISCONTINUED] metoprolol (LOPRESSOR) 100 MG tablet take 1 tablet by mouth twice a day  . Tdap (  BOOSTRIX) 5-2.5-18.5 LF-MCG/0.5 injection Inject 0.5 mLs into the muscle once.  . zoster vaccine live, PF, (ZOSTAVAX) 76808 UNT/0.65ML injection Inject 19,400 Units into the skin once.   No facility-administered encounter medications on file as of 07/22/2015.    Follow-up: Will call patient regarding follow up once labs return.    Coral Spikes DO

## 2015-07-22 NOTE — Patient Instructions (Signed)
It was nice to see you today.  We will call you with your lab results.  We are setting up your mammogram and referral for colonoscopy.  We will direct you regarding follow up after your labs are obtained.  Take care  Dr. Lacinda Axon

## 2015-07-22 NOTE — Assessment & Plan Note (Signed)
Stable. Continue Sotalol. Will obtain records from Cardiology.

## 2015-07-22 NOTE — Assessment & Plan Note (Addendum)
Obtaining labs today: CBC, CMP, A1c, lipid. Patient declined HIV screening, hepatitis C screening, DEXA scan. Prescriptions given for Zostavax and Tdap. Patient will need pneumococcal vaccine on at follow-up. Will schedule mammogram and will place referral for colonoscopy. Diabetic foot exam performed today. Patient will need urine microalbumin at follow-up. Will obtain records regarding eye exam.

## 2015-07-23 ENCOUNTER — Encounter: Payer: Self-pay | Admitting: Family Medicine

## 2015-07-23 ENCOUNTER — Telehealth: Payer: Self-pay

## 2015-07-23 NOTE — Telephone Encounter (Signed)
Pt was called to schedule a ASAP appt but there was no answer and a voicemail was left for her to cal back.

## 2015-07-24 LAB — HM DIABETES EYE EXAM

## 2015-07-27 ENCOUNTER — Encounter: Payer: Self-pay | Admitting: Family Medicine

## 2015-07-27 ENCOUNTER — Ambulatory Visit (INDEPENDENT_AMBULATORY_CARE_PROVIDER_SITE_OTHER): Payer: Medicare Other | Admitting: Family Medicine

## 2015-07-27 VITALS — BP 102/62 | Temp 98.7°F | Ht 62.75 in | Wt 142.4 lb

## 2015-07-27 DIAGNOSIS — E1140 Type 2 diabetes mellitus with diabetic neuropathy, unspecified: Secondary | ICD-10-CM

## 2015-07-27 DIAGNOSIS — E785 Hyperlipidemia, unspecified: Secondary | ICD-10-CM | POA: Diagnosis not present

## 2015-07-27 DIAGNOSIS — IMO0002 Reserved for concepts with insufficient information to code with codable children: Secondary | ICD-10-CM

## 2015-07-27 DIAGNOSIS — E1165 Type 2 diabetes mellitus with hyperglycemia: Secondary | ICD-10-CM

## 2015-07-27 LAB — MICROALBUMIN / CREATININE URINE RATIO
Creatinine,U: 125.4 mg/dL
Microalb Creat Ratio: 12 mg/g (ref 0.0–30.0)
Microalb, Ur: 15 mg/dL — ABNORMAL HIGH (ref 0.0–1.9)

## 2015-07-27 MED ORDER — ATORVASTATIN CALCIUM 40 MG PO TABS: 40.0000 mg | ORAL_TABLET | Freq: Every day | ORAL | Status: DC

## 2015-07-27 MED ORDER — METFORMIN HCL 1000 MG PO TABS: 1000.0000 mg | ORAL_TABLET | Freq: Two times a day (BID) | ORAL | Status: DC

## 2015-07-27 MED ORDER — GLIMEPIRIDE 2 MG PO TABS: 2.0000 mg | ORAL_TABLET | Freq: Every day | ORAL | Status: DC

## 2015-07-27 NOTE — Assessment & Plan Note (Signed)
Uncontrolled. ASCVD Risk 13.2%. Starting on Lipitor 40 mg today (high potency statin)

## 2015-07-27 NOTE — Patient Instructions (Signed)
It was nice to see you today.  Take the metformin and Amaryl for your blood sugar.  Record your fasting blood sugars in a log for me and bring it to your appointments.  Bring all of your medications as well.  Take the lipitor for cholesterol.  Follow up in 3 months.  Take care   Dr. Lacinda Axon

## 2015-07-27 NOTE — Progress Notes (Signed)
Pre visit review using our clinic review tool, if applicable. No additional management support is needed unless otherwise documented below in the visit note. 

## 2015-07-27 NOTE — Assessment & Plan Note (Signed)
Uncontrolled. We had a long discussion about therapeutic options today. Starting Metformin and Amaryl today. Follow up in 3 months. Urine microalbumin to be obtained today.

## 2015-07-27 NOTE — Progress Notes (Signed)
Subjective:  Patient ID: [name], female    DOB: [birth year]  Age: 64 y.o. MRN: [id number]  CC: Follow up Diabetes and HLD  HPI 64 year old female presents to the clinic today for follow up.  1) DM-2  Uncontrolled.  Recent A1C was 12.1.  She is on no medications at this time.  She would like to discuss her lab results (from her most recent visit) and treatment options today.  2) HLD  Uncontrolled. See labs below.   Patient would like to review her lab work today and discuss treatment options/recommendations.  Social Hx  - Former smoker.  Review of Systems  Constitutional: Negative for fever and chills.  Respiratory: Negative.   Cardiovascular: Negative.    Objective:  BP 102/62 mmHg  Temp(Src) 98.7 F (37.1 C) (Oral)  Ht 5' 2.75" (1.594 m)  Wt 142 lb 6 oz (64.581 kg)  BMI 25.42 kg/m2  BP/Weight 07/27/2015 7/65/4650  Systolic BP 354 656  Diastolic BP 62 70  Wt. (Lbs) 142.38 145.38  BMI 25.42 25.95   Physical Exam  Constitutional: She is oriented to person, place, and time. She appears well-developed and well-nourished. No distress.  Cardiovascular: Normal rate and regular rhythm.   No murmur heard. Pulmonary/Chest: Effort normal and breath sounds normal. No respiratory distress. She has no wheezes. She has no rales.  Abdominal: Soft. She exhibits no distension. There is no tenderness.  Neurological: She is alert and oriented to person, place, and time.  Psychiatric: She has a normal mood and affect.   Lab Results  Component Value Date   WBC 23.1* 07/22/2015   HGB 13.7 07/22/2015   HCT 41.2 07/22/2015   PLT 295.0 07/22/2015   GLUCOSE 378* 07/22/2015   CHOL 273* 07/22/2015   TRIG 390.0* 07/22/2015   HDL 34.50* 07/22/2015   LDLDIRECT 167.0 07/22/2015   ALT 18 07/22/2015   AST 21 07/22/2015   NA 129* 07/22/2015   K 5.2* 07/22/2015   CL 94* 07/22/2015   CREATININE 0.71 07/22/2015   BUN 18 07/22/2015   CO2 26 07/22/2015   HGBA1C 12.1*  07/22/2015    Assessment & Plan:   Problem List Items Addressed This Visit    Hyperlipidemia    Uncontrolled. ASCVD Risk 13.2%. Starting on Lipitor 40 mg today (high potency statin)      Relevant Medications   atorvastatin (LIPITOR) 40 MG tablet   Type 2 diabetes, uncontrolled, with neuropathy - Primary    Uncontrolled. We had a long discussion about therapeutic options today. Starting Metformin and Amaryl today. Follow up in 3 months. Urine microalbumin to be obtained today.       Relevant Medications   atorvastatin (LIPITOR) 40 MG tablet   metFORMIN (GLUCOPHAGE) 1000 MG tablet   glimepiride (AMARYL) 2 MG tablet   Other Relevant Orders   Microalbumin / creatinine urine ratio      Meds ordered this encounter  Medications  . atorvastatin (LIPITOR) 40 MG tablet    Sig: Take 1 tablet (40 mg total) by mouth daily.    Dispense:  90 tablet    Refill:  3  . metFORMIN (GLUCOPHAGE) 1000 MG tablet    Sig: Take 1 tablet (1,000 mg total) by mouth 2 (two) times daily with a meal.    Dispense:  180 tablet    Refill:  3  . glimepiride (AMARYL) 2 MG tablet    Sig: Take 1 tablet (2 mg total) by mouth daily before breakfast.  Dispense:  90 tablet    Refill:  1    Follow-up: 3 months.   [name], DO

## 2015-07-29 ENCOUNTER — Ambulatory Visit
Admission: RE | Admit: 2015-07-29 | Discharge: 2015-07-29 | Disposition: A | Discharge status: Home / Self Care | Payer: Medicare Other | Source: Ambulatory Visit | Attending: Family Medicine | Admitting: Family Medicine

## 2015-07-29 ENCOUNTER — Other Ambulatory Visit: Payer: Self-pay | Admitting: Family Medicine

## 2015-07-29 ENCOUNTER — Telehealth: Payer: Self-pay | Admitting: *Deleted

## 2015-07-29 DIAGNOSIS — Z1231 Encounter for screening mammogram for malignant neoplasm of breast: Secondary | ICD-10-CM | POA: Diagnosis not present

## 2015-07-29 DIAGNOSIS — Z1239 Encounter for other screening for malignant neoplasm of breast: Secondary | ICD-10-CM

## 2015-07-29 NOTE — Telephone Encounter (Signed)
Pt was called and told to decrease medications. When she is able to tolerate to increase slowly till she can tolerate it .

## 2015-07-29 NOTE — Telephone Encounter (Signed)
Diarrhea is likely from Metformin. Recommendation: Decrease to 500 mg (1/2 tablet) daily. Slowly increase to 500 mg twice daily and then if tolerated to 1000 mg twice daily.

## 2015-07-29 NOTE — Telephone Encounter (Signed)
Pt called states since starting Metformin on 8.22.16, she has began experiencing calf and ankle pain and also diarrhea and gas in the evenings.  Please advise

## 2015-07-31 ENCOUNTER — Other Ambulatory Visit: Payer: Self-pay | Admitting: Family Medicine

## 2015-07-31 DIAGNOSIS — N63 Unspecified lump in unspecified breast: Secondary | ICD-10-CM

## 2015-07-31 DIAGNOSIS — R928 Other abnormal and inconclusive findings on diagnostic imaging of breast: Secondary | ICD-10-CM

## 2015-08-03 ENCOUNTER — Ambulatory Visit (INDEPENDENT_AMBULATORY_CARE_PROVIDER_SITE_OTHER): Payer: Medicare Other | Admitting: Family Medicine

## 2015-08-03 ENCOUNTER — Ambulatory Visit: Payer: Commercial Managed Care - HMO | Admitting: Internal Medicine

## 2015-08-03 ENCOUNTER — Telehealth: Payer: Self-pay

## 2015-08-03 ENCOUNTER — Emergency Department: Payer: Medicare Other

## 2015-08-03 ENCOUNTER — Inpatient Hospital Stay
Admission: EM | Admit: 2015-08-03 | Discharge: 2015-08-13 | DRG: 854 | Disposition: A | Discharge status: Home / Self Care | Payer: Medicare Other | Attending: Internal Medicine | Admitting: Internal Medicine

## 2015-08-03 VITALS — Temp 100.5°F | Ht 62.75 in | Wt 138.1 lb

## 2015-08-03 DIAGNOSIS — E11621 Type 2 diabetes mellitus with foot ulcer: Secondary | ICD-10-CM | POA: Diagnosis present

## 2015-08-03 DIAGNOSIS — L97519 Non-pressure chronic ulcer of other part of right foot with unspecified severity: Secondary | ICD-10-CM | POA: Diagnosis present

## 2015-08-03 DIAGNOSIS — Z888 Allergy status to other drugs, medicaments and biological substances status: Secondary | ICD-10-CM

## 2015-08-03 DIAGNOSIS — Z961 Presence of intraocular lens: Secondary | ICD-10-CM | POA: Diagnosis present

## 2015-08-03 DIAGNOSIS — Z7982 Long term (current) use of aspirin: Secondary | ICD-10-CM | POA: Diagnosis not present

## 2015-08-03 DIAGNOSIS — Z886 Allergy status to analgesic agent status: Secondary | ICD-10-CM

## 2015-08-03 DIAGNOSIS — H9190 Unspecified hearing loss, unspecified ear: Secondary | ICD-10-CM | POA: Diagnosis present

## 2015-08-03 DIAGNOSIS — E1142 Type 2 diabetes mellitus with diabetic polyneuropathy: Secondary | ICD-10-CM | POA: Diagnosis present

## 2015-08-03 DIAGNOSIS — Z9861 Coronary angioplasty status: Secondary | ICD-10-CM

## 2015-08-03 DIAGNOSIS — Z90710 Acquired absence of both cervix and uterus: Secondary | ICD-10-CM

## 2015-08-03 DIAGNOSIS — Z8249 Family history of ischemic heart disease and other diseases of the circulatory system: Secondary | ICD-10-CM

## 2015-08-03 DIAGNOSIS — M1990 Unspecified osteoarthritis, unspecified site: Secondary | ICD-10-CM | POA: Diagnosis present

## 2015-08-03 DIAGNOSIS — Z6825 Body mass index (BMI) 25.0-25.9, adult: Secondary | ICD-10-CM

## 2015-08-03 DIAGNOSIS — Z825 Family history of asthma and other chronic lower respiratory diseases: Secondary | ICD-10-CM

## 2015-08-03 DIAGNOSIS — E440 Moderate protein-calorie malnutrition: Secondary | ICD-10-CM | POA: Diagnosis present

## 2015-08-03 DIAGNOSIS — Z79899 Other long term (current) drug therapy: Secondary | ICD-10-CM

## 2015-08-03 DIAGNOSIS — Z9842 Cataract extraction status, left eye: Secondary | ICD-10-CM | POA: Diagnosis not present

## 2015-08-03 DIAGNOSIS — E871 Hypo-osmolality and hyponatremia: Secondary | ICD-10-CM | POA: Diagnosis present

## 2015-08-03 DIAGNOSIS — Z95828 Presence of other vascular implants and grafts: Secondary | ICD-10-CM

## 2015-08-03 DIAGNOSIS — A409 Streptococcal sepsis, unspecified: Secondary | ICD-10-CM | POA: Diagnosis present

## 2015-08-03 DIAGNOSIS — E785 Hyperlipidemia, unspecified: Secondary | ICD-10-CM | POA: Diagnosis present

## 2015-08-03 DIAGNOSIS — I499 Cardiac arrhythmia, unspecified: Secondary | ICD-10-CM | POA: Diagnosis present

## 2015-08-03 DIAGNOSIS — E1152 Type 2 diabetes mellitus with diabetic peripheral angiopathy with gangrene: Secondary | ICD-10-CM | POA: Diagnosis present

## 2015-08-03 DIAGNOSIS — E860 Dehydration: Secondary | ICD-10-CM | POA: Diagnosis present

## 2015-08-03 DIAGNOSIS — E1165 Type 2 diabetes mellitus with hyperglycemia: Secondary | ICD-10-CM | POA: Diagnosis present

## 2015-08-03 DIAGNOSIS — I4891 Unspecified atrial fibrillation: Secondary | ICD-10-CM | POA: Diagnosis not present

## 2015-08-03 DIAGNOSIS — L03116 Cellulitis of left lower limb: Secondary | ICD-10-CM

## 2015-08-03 DIAGNOSIS — D638 Anemia in other chronic diseases classified elsewhere: Secondary | ICD-10-CM | POA: Diagnosis present

## 2015-08-03 DIAGNOSIS — R52 Pain, unspecified: Secondary | ICD-10-CM

## 2015-08-03 DIAGNOSIS — I10 Essential (primary) hypertension: Secondary | ICD-10-CM | POA: Diagnosis present

## 2015-08-03 DIAGNOSIS — Z9889 Other specified postprocedural states: Secondary | ICD-10-CM

## 2015-08-03 DIAGNOSIS — R509 Fever, unspecified: Secondary | ICD-10-CM

## 2015-08-03 DIAGNOSIS — I482 Chronic atrial fibrillation: Secondary | ICD-10-CM | POA: Diagnosis present

## 2015-08-03 DIAGNOSIS — G4730 Sleep apnea, unspecified: Secondary | ICD-10-CM | POA: Diagnosis present

## 2015-08-03 DIAGNOSIS — Z833 Family history of diabetes mellitus: Secondary | ICD-10-CM

## 2015-08-03 DIAGNOSIS — I739 Peripheral vascular disease, unspecified: Secondary | ICD-10-CM | POA: Diagnosis present

## 2015-08-03 DIAGNOSIS — Z87891 Personal history of nicotine dependence: Secondary | ICD-10-CM

## 2015-08-03 DIAGNOSIS — E876 Hypokalemia: Secondary | ICD-10-CM | POA: Diagnosis present

## 2015-08-03 LAB — LACTIC ACID, PLASMA
LACTIC ACID, VENOUS: 1.3 mmol/L (ref 0.5–2.0)
LACTIC ACID, VENOUS: 1 mmol/L (ref 0.5–2.0)

## 2015-08-03 LAB — URINALYSIS COMPLETE WITH MICROSCOPIC (ARMC ONLY)
Bilirubin Urine: NEGATIVE
Glucose, UA: >500 mg/dL — AB
HGB URINE DIPSTICK: NEGATIVE
Nitrite: NEGATIVE
PH: 5 (ref 5.0–8.0)
PROTEIN: 100 mg/dL — AB
SPECIFIC GRAVITY, URINE: 1.016 (ref 1.005–1.030)

## 2015-08-03 LAB — CBC WITH DIFFERENTIAL/PLATELET
Basophils Absolute: 0 10*3/uL (ref 0–0.1)
Basophils Relative: 0 %
EOS PCT: 0 %
Eosinophils Absolute: 0 10*3/uL (ref 0–0.7)
HCT: 34.3 % — ABNORMAL LOW (ref 35.0–47.0)
Hemoglobin: 11 g/dL — ABNORMAL LOW (ref 12.0–16.0)
LYMPHS ABS: 5.2 10*3/uL — AB (ref 1.0–3.6)
Lymphocytes Relative: 16 %
MCH: 26.1 pg (ref 26.0–34.0)
MCHC: 32.2 g/dL (ref 32.0–36.0)
MCV: 81 fL (ref 80.0–100.0)
MONO ABS: 1.7 10*3/uL — AB (ref 0.2–0.9)
Monocytes Relative: 5 %
Neutro Abs: 26 10*3/uL — ABNORMAL HIGH (ref 1.4–6.5)
Neutrophils Relative %: 79 %
PLATELETS: 409 10*3/uL (ref 150–440)
RBC: 4.23 MIL/uL (ref 3.80–5.20)
RDW: 13.5 % (ref 11.5–14.5)
WBC: 32.8 10*3/uL — ABNORMAL HIGH (ref 3.6–11.0)

## 2015-08-03 LAB — COMPREHENSIVE METABOLIC PANEL
ALT: 25 U/L (ref 14–54)
ANION GAP: 18 — AB (ref 5–15)
AST: 29 U/L (ref 15–41)
Albumin: 2.6 g/dL — ABNORMAL LOW (ref 3.5–5.0)
Alkaline Phosphatase: 138 U/L — ABNORMAL HIGH (ref 38–126)
BUN: 12 mg/dL (ref 6–20)
CHLORIDE: 87 mmol/L — AB (ref 101–111)
CO2: 20 mmol/L — ABNORMAL LOW (ref 22–32)
Calcium: 8.3 mg/dL — ABNORMAL LOW (ref 8.9–10.3)
Creatinine, Ser: 0.89 mg/dL (ref 0.44–1.00)
GFR calc Af Amer: >60 mL/min (ref >60)
GFR calc non Af Amer: >60 mL/min (ref >60)
Glucose, Bld: 327 mg/dL — ABNORMAL HIGH (ref 65–99)
Potassium: 3.1 mmol/L — ABNORMAL LOW (ref 3.5–5.1)
SODIUM: 125 mmol/L — AB (ref 135–145)
Total Bilirubin: 1.5 mg/dL — ABNORMAL HIGH (ref 0.3–1.2)
Total Protein: 7.8 g/dL (ref 6.5–8.1)

## 2015-08-03 LAB — GLUCOSE, CAPILLARY: GLUCOSE-CAPILLARY: 270 mg/dL — AB (ref 65–99)

## 2015-08-03 MED ORDER — VITAMIN E 180 MG (400 UNIT) PO CAPS
400.0000 [IU] | ORAL_CAPSULE | Freq: Every day | ORAL | Status: DC
  Administered 2015-08-03 – 2015-08-12 (×10): 400 [IU] via ORAL
  Filled 2015-08-03 (×10): qty 1

## 2015-08-03 MED ORDER — GLIMEPIRIDE 2 MG PO TABS
2.0000 mg | ORAL_TABLET | Freq: Every day | ORAL | Status: DC
  Administered 2015-08-04 – 2015-08-13 (×9): 2 mg via ORAL
  Filled 2015-08-03 (×11): qty 1

## 2015-08-03 MED ORDER — INSULIN GLARGINE 100 UNIT/ML ~~LOC~~ SOLN
8.0000 [IU] | Freq: Once | SUBCUTANEOUS | Status: AC
  Administered 2015-08-03: 8 [IU] via SUBCUTANEOUS
  Filled 2015-08-03: qty 0.08

## 2015-08-03 MED ORDER — ATORVASTATIN CALCIUM 20 MG PO TABS
40.0000 mg | ORAL_TABLET | Freq: Every day | ORAL | Status: DC
Start: 2015-08-03 — End: 2015-08-13
  Administered 2015-08-03 – 2015-08-12 (×10): 40 mg via ORAL
  Filled 2015-08-03 (×10): qty 2

## 2015-08-03 MED ORDER — OMEGA-3-ACID ETHYL ESTERS 1 G PO CAPS
1.0000 g | ORAL_CAPSULE | Freq: Every day | ORAL | Status: DC
  Administered 2015-08-04 – 2015-08-13 (×10): 1 g via ORAL
  Filled 2015-08-03 (×10): qty 1

## 2015-08-03 MED ORDER — INSULIN ASPART 100 UNIT/ML ~~LOC~~ SOLN
0.0000 [IU] | Freq: Every day | SUBCUTANEOUS | Status: DC
  Administered 2015-08-03: 3 [IU] via SUBCUTANEOUS
  Administered 2015-08-12: 2 [IU] via SUBCUTANEOUS
  Filled 2015-08-03: qty 3
  Filled 2015-08-03: qty 5
  Filled 2015-08-03: qty 2
  Filled 2015-08-03: qty 5

## 2015-08-03 MED ORDER — SODIUM CHLORIDE 0.9 % IV SOLN
INTRAVENOUS | Status: DC
  Administered 2015-08-03 – 2015-08-04 (×2): via INTRAVENOUS

## 2015-08-03 MED ORDER — GABAPENTIN 100 MG PO CAPS
100.0000 mg | ORAL_CAPSULE | Freq: Three times a day (TID) | ORAL | Status: DC
Start: 2015-08-03 — End: 2015-08-13
  Administered 2015-08-03 – 2015-08-13 (×28): 100 mg via ORAL
  Filled 2015-08-03 (×28): qty 1

## 2015-08-03 MED ORDER — SOTALOL HCL 80 MG PO TABS
80.0000 mg | ORAL_TABLET | Freq: Two times a day (BID) | ORAL | Status: DC
  Administered 2015-08-03 – 2015-08-13 (×20): 80 mg via ORAL
  Filled 2015-08-03 (×21): qty 1

## 2015-08-03 MED ORDER — ACETAMINOPHEN 650 MG RE SUPP: 650.0000 mg | Freq: Four times a day (QID) | RECTAL | Status: DC | PRN

## 2015-08-03 MED ORDER — HEPARIN SODIUM (PORCINE) 5000 UNIT/ML IJ SOLN
5000.0000 [IU] | Freq: Three times a day (TID) | INTRAMUSCULAR | Status: DC
  Administered 2015-08-03 – 2015-08-05 (×5): 5000 [IU] via SUBCUTANEOUS
  Filled 2015-08-03 (×5): qty 1

## 2015-08-03 MED ORDER — ONDANSETRON HCL 4 MG PO TABS: 4.0000 mg | ORAL_TABLET | Freq: Four times a day (QID) | ORAL | Status: DC | PRN

## 2015-08-03 MED ORDER — ACETAMINOPHEN 500 MG PO TABS
1000.0000 mg | ORAL_TABLET | Freq: Four times a day (QID) | ORAL | Status: DC | PRN
  Administered 2015-08-06 (×2): 1000 mg via ORAL
  Filled 2015-08-03 (×2): qty 2

## 2015-08-03 MED ORDER — VANCOMYCIN HCL IN DEXTROSE 1-5 GM/200ML-% IV SOLN
1000.0000 mg | INTRAVENOUS | Status: DC
  Administered 2015-08-04 – 2015-08-07 (×5): 1000 mg via INTRAVENOUS
  Filled 2015-08-03 (×6): qty 200

## 2015-08-03 MED ORDER — IBUPROFEN 400 MG PO TABS
400.0000 mg | ORAL_TABLET | Freq: Four times a day (QID) | ORAL | Status: DC | PRN
  Administered 2015-08-03 – 2015-08-08 (×5): 400 mg via ORAL
  Filled 2015-08-03 (×6): qty 1

## 2015-08-03 MED ORDER — ZOLPIDEM TARTRATE 5 MG PO TABS: 5.0000 mg | ORAL_TABLET | Freq: Every evening | ORAL | Status: DC | PRN

## 2015-08-03 MED ORDER — ACETAMINOPHEN 325 MG PO TABS
650.0000 mg | ORAL_TABLET | Freq: Four times a day (QID) | ORAL | Status: DC | PRN
  Administered 2015-08-04 – 2015-08-12 (×6): 650 mg via ORAL
  Filled 2015-08-03 (×6): qty 2

## 2015-08-03 MED ORDER — DIPHENHYDRAMINE HCL 25 MG PO TABS: 25.0000 mg | ORAL_TABLET | Freq: Every evening | ORAL | Status: DC | PRN

## 2015-08-03 MED ORDER — INSULIN ASPART 100 UNIT/ML ~~LOC~~ SOLN
0.0000 [IU] | Freq: Three times a day (TID) | SUBCUTANEOUS | Status: DC
  Administered 2015-08-04 (×2): 9 [IU] via SUBCUTANEOUS
  Filled 2015-08-03: qty 9

## 2015-08-03 MED ORDER — ONDANSETRON HCL 4 MG/2ML IJ SOLN: 4.0000 mg | Freq: Four times a day (QID) | INTRAMUSCULAR | Status: DC | PRN

## 2015-08-03 MED ORDER — VANCOMYCIN HCL IN DEXTROSE 1-5 GM/200ML-% IV SOLN
1000.0000 mg | Freq: Once | INTRAVENOUS | Status: AC
  Administered 2015-08-04: 1000 mg via INTRAVENOUS
  Filled 2015-08-03: qty 200

## 2015-08-03 MED ORDER — PIPERACILLIN-TAZOBACTAM 3.375 G IVPB
3.3750 g | Freq: Once | INTRAVENOUS | Status: AC
  Administered 2015-08-03: 3.375 g via INTRAVENOUS
  Filled 2015-08-03: qty 50

## 2015-08-03 MED ORDER — SODIUM CHLORIDE 0.9 % IV BOLUS (SEPSIS)
1000.0000 mL | Freq: Once | INTRAVENOUS | Status: AC
  Administered 2015-08-03: 1000 mL via INTRAVENOUS
  Filled 2015-08-03: qty 1000

## 2015-08-03 MED ORDER — VITAMIN B-6 50 MG PO TABS
100.0000 mg | ORAL_TABLET | Freq: Every day | ORAL | Status: DC
  Administered 2015-08-04 – 2015-08-12 (×9): 100 mg via ORAL
  Filled 2015-08-03 (×11): qty 1

## 2015-08-03 MED ORDER — ASPIRIN EC 81 MG PO TBEC
81.0000 mg | DELAYED_RELEASE_TABLET | Freq: Two times a day (BID) | ORAL | Status: DC
  Administered 2015-08-03 – 2015-08-13 (×20): 81 mg via ORAL
  Filled 2015-08-03 (×20): qty 1

## 2015-08-03 NOTE — H&P (Signed)
[facility] [facility] NAME: [name]    MR#:  [id number]  DATE OF BIRTH:  [birth year]  DATE OF ADMISSION:  08/03/2015  PRIMARY CARE PHYSICIAN: [name], DO   REQUESTING/REFERRING PHYSICIAN: Dr. Meade Maw  CHIEF COMPLAINT:   Chief Complaint  Patient presents with  . Blood Infection   left lower extremity swelling redness and blister formation  HISTORY OF PRESENT ILLNESS:  [name]  is a 64 y.o. female with a known history of diabetes, diabetic neuropathy, hypertension, history of atrial fibrillation, osteoarthritis who presented to the hospital due to left lower extremity strength redness and swelling and blister formation. Patient noticed a fluid blister getting increasing in size this past Saturday. She has since then noticed a redness streaking up her left leg with some increasing pain and warmth to the left lower extremity and therefore came to the ER for further evaluation. Patient was noted to have an acute left lower extremity cellulitis with blister formation and therefore hospitalist services were contacted further treatment and evaluation. Patient admits to chills but no documented fever, no nausea no vomiting or abdominal pain or chest pain or shortness of breath or any other associated symptoms presently.  PAST MEDICAL HISTORY:   Past Medical History  Diagnosis Date  . Sleep apnea     cpap  . Dysrhythmia     a fib  . Hypertension   . Neuropathy   . History of hiatal hernia   . Anemia   . Arthritis   . Diabetes mellitus without complication   . HOH (hard of hearing)     aids    PAST SURGICAL HISTORY:   Past Surgical History  Procedure Laterality Date  . Abdominal hysterectomy    . Oophorectomy    . Cardioversion    . Endovenous ablation saphenous vein w/ laser    . Cataract extraction w/phaco Left 07/16/2015    Procedure: CATARACT EXTRACTION PHACO AND INTRAOCULAR LENS PLACEMENT (IOC);  Surgeon:  Lyla Glassing, MD;  Location: [facility] ORS;  Service: Ophthalmology;  Laterality: Left;  [place]: 01:16.4 AP%: 12.7 CDE: 9.68 Fluid lot# [id number]    SOCIAL HISTORY:   Social History  Substance Use Topics  . Smoking status: Former Smoker -- 1.00 packs/day for 40 years    Types: Cigarettes  . Smokeless tobacco: Not on file  . Alcohol Use: 0.6 oz/week    1 Glasses of wine per week     Comment: occas    FAMILY HISTORY:   Family History  Problem Relation Age of Onset  . Diabetes Mother   . COPD Mother   . Heart disease Mother   . Diabetes Father   . Heart disease Father     DRUG ALLERGIES:   Allergies  Allergen Reactions  . Codeine Nausea And Vomiting  . Tape Rash    REVIEW OF SYSTEMS:   Review of Systems  Constitutional: Positive for chills. Negative for fever and weight loss.  HENT: Negative for congestion, nosebleeds and tinnitus.   Eyes: Negative for blurred vision, double vision and redness.  Respiratory: Negative for cough, hemoptysis and shortness of breath.   Cardiovascular: Negative for chest pain, orthopnea, leg swelling and PND.  Gastrointestinal: Negative for nausea, vomiting, abdominal pain, diarrhea and melena.  Genitourinary: Negative for dysuria, urgency and hematuria.  Musculoskeletal: Negative for joint pain and falls.  Skin: Positive for rash (left left lower extremity cellulitis).  Neurological: Negative for dizziness, tingling, sensory change, focal  weakness, seizures, weakness and headaches.  Endo/Heme/Allergies: Negative for polydipsia. Does not bruise/bleed easily.  Psychiatric/Behavioral: Negative for depression and memory loss. The patient is not nervous/anxious.     MEDICATIONS AT HOME:   Prior to Admission medications   Medication Sig Start Date End Date Taking? Authorizing Provider  acetaminophen (TYLENOL) 500 MG tablet Take 1,000 mg by mouth every 6 (six) hours as needed for mild pain or headache.   Yes Historical Provider, MD  aspirin EC  81 MG tablet Take 81 mg by mouth 2 (two) times daily.   Yes Historical Provider, MD  atorvastatin (LIPITOR) 40 MG tablet Take 40 mg by mouth at bedtime.   Yes Historical Provider, MD  diphenhydrAMINE (BENADRYL) 25 MG tablet Take 25 mg by mouth at bedtime as needed for sleep.   Yes Historical Provider, MD  gabapentin (NEURONTIN) 100 MG capsule Take 100 mg by mouth 3 (three) times daily.    Yes Historical Provider, MD  glimepiride (AMARYL) 2 MG tablet Take 2 mg by mouth daily with breakfast.   Yes Historical Provider, MD  ibuprofen (ADVIL,MOTRIN) 200 MG tablet Take 400 mg by mouth every 6 (six) hours as needed for headache or mild pain.   Yes Historical Provider, MD  metFORMIN (GLUCOPHAGE) 500 MG tablet Take 500 mg by mouth 2 (two) times daily with a meal.   Yes Historical Provider, MD  Omega-3 Fatty Acids (FISH OIL) 1200 MG CAPS Take 1,200 mg by mouth 2 (two) times daily.   Yes Historical Provider, MD  pyridOXINE (VITAMIN B-6) 100 MG tablet Take 100 mg by mouth at bedtime.    Yes Historical Provider, MD  sotalol (BETAPACE) 80 MG tablet Take 80 mg by mouth 2 (two) times daily.    Yes Historical Provider, MD  vitamin E 400 UNIT capsule Take 400 Units by mouth at bedtime.    Yes Historical Provider, MD      VITAL SIGNS:  Blood pressure 145/64, pulse 92, temperature 99.9 F (37.7 C), temperature source Oral, resp. rate 18, height 5\' 2"  (1.575 m), weight 63.05 kg (139 lb), SpO2 99 %.  PHYSICAL EXAMINATION:  Physical Exam  GENERAL:  64 y.o.-year-old patient lying in the bed with no acute distress.  EYES: Pupils equal, round, reactive to light and accommodation. No scleral icterus. Extraocular muscles intact.  HEENT: Head atraumatic, normocephalic. Oropharynx and nasopharynx clear. No oropharyngeal erythema, moist oral mucosa  NECK:  Supple, no jugular venous distention. No thyroid enlargement, no tenderness.  LUNGS: Normal breath sounds bilaterally, no wheezing, rales, rhonchi. No use of accessory  muscles of respiration.  CARDIOVASCULAR: S1, S2 RRR. No murmurs, rubs, gallops, clicks.  ABDOMEN: Soft, nontender, nondistended. Bowel sounds present. No organomegaly or mass.  EXTREMITIES: No pedal edema, cyanosis, or clubbing. + 2 radial pulses b/l.   NEUROLOGIC: Cranial nerves II through XII are intact. No focal Motor or sensory deficits appreciated b/l. Globally weak PSYCHIATRIC: The patient is alert and oriented x 3. Good affect.  SKIN: Left lower extremity redness and swelling consistent with cellulitis. A left foot fluid-filled blister. No ulcers, Lesions.   LABORATORY PANEL:   CBC  Recent Labs Lab 08/03/15 1716  WBC 32.8*  HGB 11.0*  HCT 34.3*  PLT 409   ------------------------------------------------------------------------------------------------------------------  Chemistries   Recent Labs Lab 08/03/15 1716  NA 125*  K 3.1*  CL 87*  CO2 20*  GLUCOSE 327*  BUN 12  CREATININE 0.89  CALCIUM 8.3*  AST 29  ALT 25  ALKPHOS 138*  BILITOT  1.5*   ------------------------------------------------------------------------------------------------------------------  Cardiac Enzymes No results for input(s): TROPONINI in the last 168 hours. ------------------------------------------------------------------------------------------------------------------  RADIOLOGY:  Dg Chest 2 View  08/03/2015   CLINICAL DATA:  Sepsis, lower extremity infection, diabetes  EXAM: CHEST  2 VIEW  COMPARISON:  10/31/2009  FINDINGS: Mild cardiomegaly again noted without evidence for edema. No focal pulmonary opacity. No pleural effusion. Mild pectus excavatum deformity. Bones are subjectively osteopenic. No acute osseous abnormality.  IMPRESSION: Mild cardiomegaly without focal acute finding.   Electronically Signed   By: Conchita Paris M.D.   On: 08/03/2015 18:10     IMPRESSION AND PLAN:   64 year old female with past medical history of diabetes, diabetic neuropathy, lipidemia, history of  chronic afibrillation who presented to the hospital due to left lower extremity redness and swelling and blister formation. Patient was noted to have an acute left lower extremity cellulitis.  #1 acute left lower extremity cellulitis-likely the cause of patient's redness swelling and blister formation. -I will start the patient on IV vancomycin, follow blood cultures -I will also get a wound team consult. I will also get the podiatry consult given also on her right third toe which is likely not infected. -Follow clinically.  #2 hyponatremia-likely secondary to hyperglycemia complicated with some dehydration. -We'll hydrate the patient with IV fluids, correct hypergycemia and follow sodium.  #3 type 2 diabetes-patient is already on oral meds and I will hold metformin for now. Continue glimepiride, add sliding scale insulin. Check hemoglobin A1c. Consider diabetic lifestyle assessment if A1c is increased. Next  #4 hyperlipidemia-continue atorvastatin.  #5 diabetic neuropathy-continue Neurontin.  #6 history of chronic atrial fibrillation-continue sotalol. Patient chads score is 3. Continue aspirin, for discussion of long-term metabolic relation to be done by cardiologist as an outpatient. -Patient is followed by Dr. Clayborn Bigness    All the records are reviewed and case discussed with ED provider. Management plans discussed with the patient, family and they are in agreement.  CODE STATUS: Full  TOTAL TIME TAKING CARE OF THIS PATIENT: 50 minutes.    Henreitta Leber M.D on 08/03/2015 at 8:46 PM  Between 7am to 6pm - Pager - [phone]  After 6pm go to [email or web address] - password [email or web address]  Office  [phone]  CC: Primary care physician; [name], DO

## 2015-08-03 NOTE — Progress Notes (Signed)
ANTIBIOTIC CONSULT NOTE - INITIAL  Pharmacy Consult for vancomycin Indication: cellulitis  Allergies  Allergen Reactions  . Codeine Nausea And Vomiting  . Tape Rash    Patient Measurements: Height: 5\' 2"  (157.5 cm) Weight: 138 lb 11.2 oz (62.914 kg) IBW/kg (Calculated) : 50.1 Adjusted Body Weight: 55 kg  Vital Signs: Temp: 99.9 F (37.7 C) (08/29 1701) Temp Source: Oral (08/29 1701) BP: 148/67 mmHg (08/29 2256) Pulse Rate: 106 (08/29 2256) Intake/Output from previous day:   Intake/Output from this shift:    Labs:  Recent Labs  08/03/15 1716  WBC 32.8*  HGB 11.0*  PLT 409  CREATININE 0.89   Estimated Creatinine Clearance: 54.9 mL/min (by C-G formula based on Cr of 0.89). No results for input(s): VANCOTROUGH, VANCOPEAK, VANCORANDOM, GENTTROUGH, GENTPEAK, GENTRANDOM, TOBRATROUGH, TOBRAPEAK, TOBRARND, AMIKACINPEAK, AMIKACINTROU, AMIKACIN in the last 72 hours.   Microbiology: No results found for this or any previous visit (from the past 720 hour(s)).  Medical History: Past Medical History  Diagnosis Date  . Sleep apnea     cpap  . Dysrhythmia     a fib  . Hypertension   . Neuropathy   . History of hiatal hernia   . Anemia   . Arthritis   . Diabetes mellitus without complication   . HOH (hard of hearing)     aids  . A-fib     Medications:  Infusions:  . sodium chloride     Assessment: 63 yof cc left lower extremity swelling/redness/blister formation, PMH diabetes, neuropathy with LLE pain/warmth/blister since Saturday increasing in size. Clinical cellulitis, blood cultures pending.   Vd 44 L, Ke 0.05 hr-1, T1/2 13.8 hours, predicted trough 16 mcg/mL  Goal of Therapy:  Vancomycin trough level 10-15 mcg/ml  Plan:  Expected duration 7 days with resolution of temperature and/or normalization of WBC. Vancomycin 1 gm IV Q18H ordered with stacked dosing (second dose approximately 9 hours after first), will check trough before fourth dose and adjust as  needed to maintain trough 10 to 20 mcg/mL.  Laural Benes, Pharm.D. Clinical Pharmacist 08/03/2015,11:26 PM

## 2015-08-03 NOTE — Assessment & Plan Note (Addendum)
Patient meets criteria for sepsis. Patient is acutely ill at this time and clearly warrants hospitalization. Will need empiric IV antibiotics and wound care consult.

## 2015-08-03 NOTE — Assessment & Plan Note (Signed)
Patient significantly tachycardic today. EKG was obtained and reviewed personally by myself. It revealed atrial fibrillation at a rate of 121 bpm. Patient will be going to the ED given other associated problems this visit. Recommendation: IV fluids, telemetry monitoring, and beta blocker.

## 2015-08-03 NOTE — Assessment & Plan Note (Signed)
Patient also has evidence of diabetic foot wounds of her right foot. Will need IV antibiotics &  potentially further evaluation with imaging

## 2015-08-03 NOTE — ED Notes (Signed)
Patient began with redness to left foot on Friday. Went to [facility] today and was sent here via EMS due to redness and blistering of left foot and lower left leg.

## 2015-08-03 NOTE — Progress Notes (Signed)
Pre visit review using our clinic review tool, if applicable. No additional management support is needed unless otherwise documented below in the visit note. 

## 2015-08-03 NOTE — Progress Notes (Signed)
   Subjective:  Patient ID: [name], female    DOB: [birth year]  Age: 64 y.o. MRN: [id number]  CC: L foot blister and leg redness  HPI  64 year old female with history of fibrillation, type 2 diabetes with neuropathy, and hyperlipidemia presents to clinic today for evaluation of a large blister on her left foot and associated foot and leg redness.  Patient reports that over the weekend she developed redness of her left foot and then subsequently developed a large blister. She reports that the redness has been worsening. She reports associated subjective fever and chills. She states that she does not feel well and has had decreased appetite. Additionally, patient reports that she opened up the callus on her right foot and now has open areas with foul-smelling drainage.  No relieving factors. No interventions tried. Patient does report that she called the podiatrist but was unable to see him.  Social Hx  Social History  Substance Use Topics  . Smoking status: Former Smoker    Quit date: 07/21/2009  . Smokeless tobacco: Never Used  . Alcohol Use: 0.0 - 0.6 oz/week    0-1 Standard drinks or equivalent per week   Review of Systems  Constitutional:       Subjective fever/chills at home.  Respiratory: Negative for shortness of breath.   Cardiovascular: Negative for chest pain.  Skin: Positive for wound.    Objective:  Temp(Src) 100.5 F (38.1 C) (Oral)  Ht 5' 2.75" (1.594 m)  Wt 138 lb 2 oz (62.653 kg)  BMI 24.66 kg/m2  SpO2 96%  BP/Weight 08/03/2015 07/27/2015 2/68/3419  Systolic BP - 622 297  Diastolic BP - 62 70  Wt. (Lbs) 138.13 142.38 145.38  BMI 24.66 25.42 25.95   Physical Exam  Constitutional: She is oriented to person, place, and time. She appears well-developed.  Appears fatigued.   Cardiovascular: An irregularly irregular rhythm present. Tachycardia present.   Pulmonary/Chest: Effort normal and breath sounds normal.  Neurological: She is alert and oriented to  person, place, and time.  Skin:  Large blood filled bullae noted on the dorsum of the left foot.  Surrounding erythema extends proximally; approximately two thirds of the way up to the knee.   Right foot - Third toe with an open lesion. There is also an interdigital wound. Purulence & foul smell noted.   Assessment & Plan:   Problem List Items Addressed This Visit    Atrial fibrillation - Primary    Patient significantly tachycardic today. EKG was obtained and reviewed personally by myself. It revealed atrial fibrillation at a rate of 121 bpm. Patient will be going to the ED given other associated problems this visit. Recommendation: IV fluids, telemetry monitoring, and beta blocker.      Relevant Orders   EKG 12-Lead (Completed)   Cellulitis of leg, left    Patient meets criteria for sepsis. Patient is acutely ill at this time and clearly warrants hospitalization. Will need empiric IV antibiotics and wound care consult.       Diabetic foot ulcer    Patient also has evidence of diabetic foot wounds of her right foot. Will need IV antibiotics &  potentially further evaluation with imaging        Follow-up: Following Hospitalizaiton   [name], DO

## 2015-08-03 NOTE — Progress Notes (Signed)
   08/03/15 1800  Clinical Encounter Type  Visited With Patient and family together  Visit Type Spiritual support  Spiritual Encounters  Spiritual Needs Emotional  Stress Factors  Patient Stress Factors Health changes   Status: 50yrs female, complaining about pain in foot Family: daughter in law and son Brad bedside Faith: [employer] Visit Assessment: The chaplain visited the family to offer prayer for healing and comfort. The patient shared that she had not eaten as of yet and her daughter n law will order a dinner tray. Prayers for healing and comfort will be lifted up on her behalf.  Pastoral Care: [phone] pager or by online request.

## 2015-08-03 NOTE — Telephone Encounter (Signed)
Called Amanda at [facility] to ask about the pt's Mammogram she told me to let pt know to call her and she could give her an appt. i called pt letting her know amanda's information.

## 2015-08-03 NOTE — ED Provider Notes (Signed)
Time Seen: Approximately 1705  I have reviewed the triage notes  Chief Complaint: Blood Infection   History of Present Illness: [name] is a 64 y.o. female who presents with some redness and leg swelling that occurred over the last couple of days. Patient states she began with some redness on Friday and has noticed since that time that the redness has ascended up the left leg and she's noticed some increased dark a bruised area on the anterior surface of her left foot. Family states they've had some difficulties getting her to take her metformin at home and is a known diabetic. Denies any dysuria, hematuria or urinary frequency. She states the pain and swelling has progressed since that time and the patient was transported here by EMS after she went to her primary physician's clinic. She denies any chest pain or shortness of breath. She is not aware of any fever at home. She's had difficulty with ambulation on the left foot over the last couple of days.   Past Medical History  Diagnosis Date  . Sleep apnea     cpap  . Dysrhythmia     a fib  . Hypertension   . Neuropathy   . History of hiatal hernia   . Anemia   . Arthritis   . Diabetes mellitus without complication   . HOH (hard of hearing)     aids    Patient Active Problem List   Diagnosis Date Noted  . Cellulitis 08/03/2015    Past Surgical History  Procedure Laterality Date  . Abdominal hysterectomy    . Oophorectomy    . Cardioversion    . Endovenous ablation saphenous vein w/ laser    . Cataract extraction w/phaco Left 07/16/2015    Procedure: CATARACT EXTRACTION PHACO AND INTRAOCULAR LENS PLACEMENT (IOC);  Surgeon: Lyla Glassing, MD;  Location: [facility] ORS;  Service: Ophthalmology;  Laterality: Left;  [place]: 01:16.4 AP%: 12.7 CDE: 9.68 Fluid lot# [id number]    Past Surgical History  Procedure Laterality Date  . Abdominal hysterectomy    . Oophorectomy    . Cardioversion    . Endovenous ablation saphenous  vein w/ laser    . Cataract extraction w/phaco Left 07/16/2015    Procedure: CATARACT EXTRACTION PHACO AND INTRAOCULAR LENS PLACEMENT (IOC);  Surgeon: Lyla Glassing, MD;  Location: [facility] ORS;  Service: Ophthalmology;  Laterality: Left;  [place]: 01:16.4 AP%: 12.7 CDE: 9.68 Fluid lot# [id number]    Current Outpatient Rx  Name  Route  Sig  Dispense  Refill  . acetaminophen (TYLENOL) 500 MG tablet   Oral   Take 1,000 mg by mouth every 6 (six) hours as needed for mild pain or headache.         Marland Kitchen aspirin EC 81 MG tablet   Oral   Take 81 mg by mouth 2 (two) times daily.         Marland Kitchen atorvastatin (LIPITOR) 40 MG tablet   Oral   Take 40 mg by mouth at bedtime.         . diphenhydrAMINE (BENADRYL) 25 MG tablet   Oral   Take 25 mg by mouth at bedtime as needed for sleep.         Marland Kitchen gabapentin (NEURONTIN) 100 MG capsule   Oral   Take 100 mg by mouth 3 (three) times daily.          Marland Kitchen glimepiride (AMARYL) 2 MG tablet   Oral   Take 2 mg by mouth daily  with breakfast.         . ibuprofen (ADVIL,MOTRIN) 200 MG tablet   Oral   Take 400 mg by mouth every 6 (six) hours as needed for headache or mild pain.         . metFORMIN (GLUCOPHAGE) 500 MG tablet   Oral   Take 500 mg by mouth 2 (two) times daily with a meal.         . Omega-3 Fatty Acids (FISH OIL) 1200 MG CAPS   Oral   Take 1,200 mg by mouth 2 (two) times daily.         Marland Kitchen pyridOXINE (VITAMIN B-6) 100 MG tablet   Oral   Take 100 mg by mouth at bedtime.          . sotalol (BETAPACE) 80 MG tablet   Oral   Take 80 mg by mouth 2 (two) times daily.          . vitamin E 400 UNIT capsule   Oral   Take 400 Units by mouth at bedtime.            Allergies:  Codeine and Tape  Family History: Family History  Problem Relation Age of Onset  . Diabetes Mother   . COPD Mother   . Heart disease Mother   . Diabetes Father   . Heart disease Father     Social History: Social History  Substance Use Topics  .  Smoking status: Former Smoker -- 1.00 packs/day for 40 years    Types: Cigarettes  . Smokeless tobacco: None  . Alcohol Use: 0.6 oz/week    1 Glasses of wine per week     Comment: occas     Review of Systems:   10 point review of systems was performed and was otherwise negative:  Constitutional: Fever was noted at the clinic Eyes: No visual disturbances ENT: No sore throat, ear pain Cardiac: No chest pain Respiratory: No shortness of breath, wheezing, or stridor Abdomen: No abdominal pain, no vomiting, No diarrhea Endocrine: No weight loss, No night sweats Extremities: Extremities swelling noted in H&P Skin: No rashes, easy bruising Neurologic: No focal weakness, trouble with speech or swollowing Urologic: No dysuria, Hematuria, or urinary frequency   Physical Exam:  ED Triage Vitals  Enc Vitals Group     BP 08/03/15 1701 119/76 mmHg     Pulse Rate 08/03/15 1701 81     Resp 08/03/15 1701 18     Temp 08/03/15 1701 99.9 F (37.7 C)     Temp Source 08/03/15 1701 Oral     SpO2 08/03/15 1701 100 %     Weight 08/03/15 1701 139 lb (63.05 kg)     Height 08/03/15 1701 5\' 2"  (1.575 m)     Head Cir --      Peak Flow --      Pain Score 08/03/15 1703 8     Pain Loc --      Pain Edu? --      Excl. in [place]? --     General: Awake , Alert , and Oriented times 3; GCS 15 Head: Normal cephalic , atraumatic Eyes: Pupils equal , round, reactive to light Nose/Throat: No nasal drainage, patent upper airway without erythema or exudate. No ketotic breath Neck: Supple, Full range of motion, No anterior adenopathy or palpable thyroid masses Lungs: Clear to ascultation without wheezes , rhonchi, or rales Heart: Regular rate, regular rhythm without murmurs , gallops , or rubs Abdomen: Soft,  non tender without rebound, guarding , or rigidity; bowel sounds positive and symmetric in all 4 quadrants. No organomegaly .        Extremities: Exam of left lower extremity shows a bullous cellulitis  located especially over the anterior surface of her left foot ascending with redness on the lateral surface of her left foot Neurologic: normal ambulation, Motor symmetric without deficits, sensory intact Skin: No other rashes or lesions are noted other than her left foot   Labs:   All laboratory work was reviewed including any pertinent negatives or positives listed below:  Labs Reviewed  COMPREHENSIVE METABOLIC PANEL - Abnormal; Notable for the following:    Sodium 125 (*)    Potassium 3.1 (*)    Chloride 87 (*)    CO2 20 (*)    Glucose, Bld 327 (*)    Calcium 8.3 (*)    Albumin 2.6 (*)    Alkaline Phosphatase 138 (*)    Total Bilirubin 1.5 (*)    Anion gap 18 (*)    All other components within normal limits  CBC WITH DIFFERENTIAL/PLATELET - Abnormal; Notable for the following:    WBC 32.8 (*)    Hemoglobin 11.0 (*)    HCT 34.3 (*)    Neutro Abs 26.0 (*)    Lymphs Abs 5.2 (*)    Monocytes Absolute 1.7 (*)    All other components within normal limits  URINALYSIS COMPLETEWITH MICROSCOPIC ([facility] ONLY) - Abnormal; Notable for the following:    Color, Urine AMBER (*)    APPearance CLOUDY (*)    Glucose, UA >500 (*)    Ketones, ur 1+ (*)    Protein, ur 100 (*)    Leukocytes, UA TRACE (*)    Bacteria, UA RARE (*)    Squamous Epithelial / LPF 6-30 (*)    All other components within normal limits  CULTURE, BLOOD (ROUTINE X 2)  CULTURE, BLOOD (ROUTINE X 2)  URINE CULTURE  LACTIC ACID, PLASMA  LACTIC ACID, PLASMA  KETONES, URINE  HEMOGLOBIN A1C    EKG:  ED ECG REPORT I, Daymon Larsen, the attending physician, personally viewed and interpreted this ECG.  Date: 08/03/2015 EKG Time: 1755 Rate: 98 Rhythm: Atrial fibrillation with what appears to be old septal changes QRS Axis: normal Intervals: normal ST/T Wave abnormalities: normal Conduction Disutrbances: none Narrative Interpretation: unremarkable No acute ischemic changes noted  Radiology:    EXAM: CHEST  2 VIEW  COMPARISON: 10/31/2009  FINDINGS: Mild cardiomegaly again noted without evidence for edema. No focal pulmonary opacity. No pleural effusion. Mild pectus excavatum deformity. Bones are subjectively osteopenic. No acute osseous abnormality.  IMPRESSION: Mild cardiomegaly without focal acute finding.   I personally reviewed the radiologic studies   Procedures: None  Critical Care: CRITICAL CARE Performed by: Daymon Larsen   Total critical care time: 37 minutes  Critical care time was exclusive of separately billable procedures and treating other patients.  Critical care was necessary to treat or prevent imminent or life-threatening deterioration.  Critical care was time spent personally by me on the following activities: development of treatment plan with patient and/or surrogate as well as nursing, discussions with consultants, evaluation of patient's response to treatment, examination of patient, obtaining history from patient or surrogate, ordering and performing treatments and interventions, ordering and review of laboratory studies, ordering and review of radiographic studies, pulse oximetry and re-evaluation of patient's condition.     ED Course:  Patient was started with IV and blood cultures  2. Lactic acid level was pending on disposition but appears to be within normal limits. White blood cell count is significantly elevated and indicative of sepsis. Patient has blood cultures pending and also had a urine urine culture. Patient's chest x-ray does not show any findings consistent with pneumonia. She has hyponatremia which is consistent with her elevated glucose level. She does not appear to be in diabetic ketoacidosis at this time with only a slightly elevated anion gap. The patient was given initial dosage of subcutaneous insulin. Her atrial fibrillation per the hospitalist seems to be an old finding. She was initiated on IV fluid  resuscitation.    Assessment:  Left lower extremity cellulitis Hyperglycemia Sepsis      Plan: I reviewed the case with the hospitalist team, further disposition and management depends upon their evaluation.          Go to top  Daymon Larsen, MD 08/03/15 2139

## 2015-08-04 ENCOUNTER — Inpatient Hospital Stay: Payer: Medicare Other

## 2015-08-04 LAB — BASIC METABOLIC PANEL
ANION GAP: 10 (ref 5–15)
BUN: 11 mg/dL (ref 6–20)
CALCIUM: 7.7 mg/dL — AB (ref 8.9–10.3)
CHLORIDE: 98 mmol/L — AB (ref 101–111)
CO2: 23 mmol/L (ref 22–32)
CREATININE: 0.63 mg/dL (ref 0.44–1.00)
GFR calc non Af Amer: >60 mL/min (ref >60)
GLUCOSE: 356 mg/dL — AB (ref 65–99)
Potassium: 2.9 mmol/L — CL (ref 3.5–5.1)
Sodium: 131 mmol/L — ABNORMAL LOW (ref 135–145)

## 2015-08-04 LAB — CBC
HCT: 29 % — ABNORMAL LOW (ref 35.0–47.0)
HEMOGLOBIN: 9.6 g/dL — AB (ref 12.0–16.0)
MCH: 26.4 pg (ref 26.0–34.0)
MCHC: 33 g/dL (ref 32.0–36.0)
MCV: 79.8 fL — AB (ref 80.0–100.0)
Platelets: 342 10*3/uL (ref 150–440)
RBC: 3.63 MIL/uL — AB (ref 3.80–5.20)
RDW: 13.5 % (ref 11.5–14.5)
WBC: 23.7 10*3/uL — ABNORMAL HIGH (ref 3.6–11.0)

## 2015-08-04 LAB — GLUCOSE, CAPILLARY
GLUCOSE-CAPILLARY: 368 mg/dL — AB (ref 65–99)
GLUCOSE-CAPILLARY: 374 mg/dL — AB (ref 65–99)
GLUCOSE-CAPILLARY: 178 mg/dL — AB (ref 65–99)
Glucose-Capillary: 159 mg/dL — ABNORMAL HIGH (ref 65–99)

## 2015-08-04 LAB — HEMOGLOBIN A1C: Hgb A1c MFr Bld: 11.9 % — ABNORMAL HIGH (ref 4.0–6.0)

## 2015-08-04 MED ORDER — POTASSIUM CHLORIDE 10 MEQ/100ML IV SOLN
10.0000 meq | INTRAVENOUS | Status: AC
  Administered 2015-08-04 (×2): 10 meq via INTRAVENOUS
  Filled 2015-08-04 (×2): qty 100

## 2015-08-04 MED ORDER — INSULIN ASPART 100 UNIT/ML ~~LOC~~ SOLN
0.0000 [IU] | Freq: Three times a day (TID) | SUBCUTANEOUS | Status: DC
  Administered 2015-08-04 – 2015-08-05 (×2): 3 [IU] via SUBCUTANEOUS
  Administered 2015-08-05 – 2015-08-06 (×2): 2 [IU] via SUBCUTANEOUS
  Administered 2015-08-06 – 2015-08-08 (×5): 3 [IU] via SUBCUTANEOUS
  Administered 2015-08-09 – 2015-08-11 (×4): 2 [IU] via SUBCUTANEOUS
  Administered 2015-08-11: 3 [IU] via SUBCUTANEOUS
  Administered 2015-08-12: 2 [IU] via SUBCUTANEOUS
  Administered 2015-08-12: 5 [IU] via SUBCUTANEOUS
  Administered 2015-08-13: 2 [IU] via SUBCUTANEOUS
  Administered 2015-08-13: 5 [IU] via SUBCUTANEOUS
  Filled 2015-08-04: qty 2
  Filled 2015-08-04: qty 3
  Filled 2015-08-04: qty 5
  Filled 2015-08-04: qty 3
  Filled 2015-08-04 (×2): qty 2
  Filled 2015-08-04: qty 9
  Filled 2015-08-04 (×2): qty 2
  Filled 2015-08-04 (×2): qty 3
  Filled 2015-08-04: qty 2
  Filled 2015-08-04: qty 3
  Filled 2015-08-04: qty 2
  Filled 2015-08-04 (×3): qty 3
  Filled 2015-08-04: qty 2

## 2015-08-04 MED ORDER — POTASSIUM CHLORIDE IN NACL 20-0.45 MEQ/L-% IV SOLN
INTRAVENOUS | Status: DC
  Administered 2015-08-04 – 2015-08-05 (×2): via INTRAVENOUS
  Filled 2015-08-04 (×5): qty 1000

## 2015-08-04 MED ORDER — INSULIN GLARGINE 100 UNIT/ML ~~LOC~~ SOLN
13.0000 [IU] | Freq: Every day | SUBCUTANEOUS | Status: DC
  Administered 2015-08-04 – 2015-08-12 (×8): 13 [IU] via SUBCUTANEOUS
  Filled 2015-08-04 (×10): qty 0.13

## 2015-08-04 MED ORDER — POTASSIUM CHLORIDE CRYS ER 20 MEQ PO TBCR
40.0000 meq | EXTENDED_RELEASE_TABLET | Freq: Two times a day (BID) | ORAL | Status: DC
  Administered 2015-08-04 (×2): 40 meq via ORAL
  Filled 2015-08-04 (×3): qty 2

## 2015-08-04 NOTE — Progress Notes (Addendum)
Inpatient Diabetes Program Recommendations  AACE/ADA: New Consensus Statement on Inpatient Glycemic Control (2013)  Target Ranges:  Prepandial:   less than 140 mg/dL      Peak postprandial:   less than 180 mg/dL (1-2 hours)      Critically ill patients:  140 - 180 mg/dL   Results for [name], [name] (MRN [id number]) as of 08/04/2015 09:07  Ref. Range 08/03/2015 23:02 08/04/2015 08:45  Glucose-Capillary Latest Ref Range: 65-99 mg/dL 270 (H) 368 (H)   Reason for Visit:Elevated CBG  Diabetes history: Type 2 Outpatient Diabetes medications: Amaryl 2mg /day, Glucophage 500mg  bid Current orders for Inpatient glycemic control: Amaryl 2mg /day, Novolog correction insulin 0-9 units tid, Novolog 0-5 units qhs   A1C pending  Fasting blood sugar 368mg /dl this am- consider ordering  Lantus insulin 13 units qhs starting tonight (one time dose of Lantus 8 units was given at 2018 last night). Consider increasing Novolog correction to moderate scale 0-15 units tid with meals.  Will follow.  Gentry Fitz, RN, BA, MHA, CDE Diabetes Coordinator Inpatient Diabetes Program  [phone] (Team Pager) [phone] ([place]) 08/04/2015 9:16 AM

## 2015-08-04 NOTE — Progress Notes (Signed)
Notified Dr Vianne Bulls of positive blood cultures results called by lab; Dr acknowledged, no new orders

## 2015-08-04 NOTE — Consult Note (Signed)
WOC wound consult note Reason for Consult:Left lower leg and dorsal foot cellulitis.  Diabetes and neuropathy.  Blood filled blister to left dorsal foot.  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  These are chronic and patient has been caring for these wounds at home.  Wound type:Cellulitis Pressure Ulcer POA: N/A Measurement:14 cm x 12 cm raised blood filled blister (+3 cm) Right plantar third toe 0.5 cm diameter lesion Wound bed:Intact blood filled blister to left foot Eschar to right third toe Drainage (amount, consistency, odor) None Periwound:Erythema to left lower extremity Dressing procedure/placement/frequency:Cleanse left foot with NS and pat gently dry.  Apply Xeroform gauze to blister wound bed and secure with kerlix and tape.  Change daily.  Paint right nonintact toes with Betadine daily.  [place] team will continue to follow and remain available to patient, medical and nursing teams.  Domenic Moras RN BSN [place] Pager [phone]

## 2015-08-04 NOTE — Progress Notes (Addendum)
Initial Nutrition Assessment   Documentation Codes:  Non-severe (Moderate) malnutrition in context of acute illness/injury   INTERVENTION:  Meals and snacks: Cater to pt preferences Nutrition Supplement Therapy: Will add no sugar added mightyshake BID for added nutrition   NUTRITION DIAGNOSIS:   Inadequate oral intake related to acute illness as evidenced by per patient/family report.    GOAL:   Patient will meet greater than or equal to 90% of their needs    MONITOR:    (Energy intake)  REASON FOR ASSESSMENT:   Malnutrition Screening Tool    ASSESSMENT:      Pt admitted with lower extermity cellulitis  Past Medical History  Diagnosis Date  . Sleep apnea     cpap  . Dysrhythmia     a fib  . Hypertension   . Neuropathy   . History of hiatal hernia   . Anemia   . Arthritis   . Diabetes mellitus without complication   . HOH (hard of hearing)     aids  . A-fib     Current Nutrition: Pt slept through lunch. Reports ate 50% of eggs, home fries and toast and 100% juice and milk  Food/Nutrition-Related History: Pt reports for the past week prior to admission decreased intake secondary to fever and not feeling well   Medications: amaryl, lantus, NS at 125ml/hr, aspart, omega 3, vit e  Electrolyte/Renal Profile and Glucose Profile:   Recent Labs Lab 08/03/15 1716 08/04/15 0432  NA 125* 131*  K 3.1* 2.9*  CL 87* 98*  CO2 20* 23  BUN 12 11  CREATININE 0.89 0.63  CALCIUM 8.3* 7.7*  GLUCOSE 327* 356*   Protein Profile:   Recent Labs Lab 08/03/15 1716  ALBUMIN 2.6*    Gastrointestinal Profile: Last BM:8/27   Nutrition-Focused Physical Exam Findings: Nutrition-Focused physical exam completed. Findings are WDL for fat depletion, muscle depletion, and edema.     Weight Change: 4% weight loss in the last 3 weeks    Diet Order:  Diet heart healthy/carb modified Room service appropriate?: Yes; Fluid consistency:: Thin  Skin:   reviewed   Height:   Ht Readings from Last 1 Encounters:  08/03/15 5\' 2"  (1.575 m)    Weight:   Wt Readings from Last 1 Encounters:  08/03/15 138 lb 11.2 oz (62.914 kg)     BMI:  Body mass index is 25.36 kg/(m^2).  Estimated Nutritional Needs:   Kcal:  BEE 1128 kcals (IF 1.1-1.3, AF 1.3) 3810-1751 kcals/d.   Protein:  (1.0-1.2 g/kg) 63-76 g/d  Fluid:  (30-36ml/kg) 1890-2263ml/d  EDUCATION NEEDS:   No education needs identified at this time  Scott. Zenia Resides, [place], [place] (pager)

## 2015-08-04 NOTE — Progress Notes (Signed)
[place] at [facility]   PATIENT NAME: [name]    MR#:  [id number]  DATE OF BIRTH:  August 12, 1951  SUBJECTIVE:63 yr old female with h/o DMII,htn,afib ,admitted for left leg cellulitis/blister on dorsum of left foot.having lot of pain,left foot front part is turninig black.had chills at home.spiking fever now,  CHIEF COMPLAINT:   Chief Complaint  Patient presents with  . Blood Infection    REVIEW OF SYSTEMS:   ROS CONSTITUTIONAL: No fever, fatigue or weakness.  EYES: No blurred or double vision.  EARS, NOSE, AND THROAT: No tinnitus or ear pain.  RESPIRATORY: No cough, shortness of breath, wheezing or hemoptysis.  CARDIOVASCULAR: No chest pain, orthopnea, edema.  GASTROINTESTINAL: No nausea, vomiting, diarrhea or abdominal pain.  GENITOURINARY: No dysuria, hematuria.  ENDOCRINE: No polyuria, nocturia,  HEMATOLOGY: No anemia, easy bruising or bleeding SKIN: No rash or lesion. MUSCULOSKELETAL: left  Leg cellulitis/left foot dorsum has black discolaration,appears like dry gangrene   NEUROLOGIC: h/o neuropathy  PSYCHIATRY: No anxiety or depression.   DRUG ALLERGIES:   Allergies  Allergen Reactions  . Codeine Nausea And Vomiting  . Tape Rash    VITALS:  Blood pressure 133/51, pulse 93, temperature 102.7 F (39.3 C), temperature source Oral, resp. rate 18, height 5\' 2"  (1.575 m), weight 62.914 kg (138 lb 11.2 oz), SpO2 97 %.  PHYSICAL EXAMINATION:  GENERAL:  64 y.o.-year-old patient lying in the bed with no acute distress.  EYES: Pupils equal, round, reactive to light and accommodation. No scleral icterus. Extraocular muscles intact.  HEENT: Head atraumatic, normocephalic. Oropharynx and nasopharynx clear.  NECK:  Supple, no jugular venous distention. No thyroid enlargement, no tenderness.  LUNGS: Normal breath sounds bilaterally, no wheezing, rales,rhonchi or crepitation. No use of accessory muscles of respiration.  CARDIOVASCULAR: S1,  S2 normal. No murmurs, rubs, or gallops.  ABDOMEN: Soft, nontender, nondistended. Bowel sounds present. No organomegaly or mass.  EXTREMITIES: left foot gangrene/left leg celulitis NEUROLOGIC: Cranial nerves II through XII are intact. Muscle strength 5/5 in all extremities. Sensation intact. Gait not checked.  PSYCHIATRIC: The patient is alert and oriented x 3.  SKIN: No obvious rash, lesion, or ulcer.    LABORATORY PANEL:   CBC  Recent Labs Lab 08/04/15 0432  WBC 23.7*  HGB 9.6*  HCT 29.0*  PLT 342   ------------------------------------------------------------------------------------------------------------------  Chemistries   Recent Labs Lab 08/03/15 1716 08/04/15 0432  NA 125* 131*  K 3.1* 2.9*  CL 87* 98*  CO2 20* 23  GLUCOSE 327* 356*  BUN 12 11  CREATININE 0.89 0.63  CALCIUM 8.3* 7.7*  AST 29  --   ALT 25  --   ALKPHOS 138*  --   BILITOT 1.5*  --    ------------------------------------------------------------------------------------------------------------------  Cardiac Enzymes No results for input(s): TROPONINI in the last 168 hours. ------------------------------------------------------------------------------------------------------------------  RADIOLOGY:  Dg Chest 2 View  08/03/2015   CLINICAL DATA:  Sepsis, lower extremity infection, diabetes  EXAM: CHEST  2 VIEW  COMPARISON:  10/31/2009  FINDINGS: Mild cardiomegaly again noted without evidence for edema. No focal pulmonary opacity. No pleural effusion. Mild pectus excavatum deformity. Bones are subjectively osteopenic. No acute osseous abnormality.  IMPRESSION: Mild cardiomegaly without focal acute finding.   Electronically Signed   By: Conchita Paris M.D.   On: 08/03/2015 18:10    EKG:   Orders placed or performed during the hospital encounter of 08/03/15  . EKG 12-Lead  . EKG 12-Lead  . EKG  ASSESSMENT AND PLAN:  Left foot cellulitis/possible developing early gangrene of left  foot;spiking fever Continue vanco/zosyn/add clinda/appreciate Wound care following,consult podiatry/s ID.,check xray of left foot,wbc down from 32 to 23  2.sepsis due to 1.;blood cultures showing gram positive cocci,continue Vanco,continue it,follow full culture result 3.Hyponatremia/hypokalemia/;continue IV fluids, with K replacement 4.DMII;uncontrolled;;added  lantus,,novolg to moderate scale,seen by diabetic RN 5.chronci afib;rate  Controlled ,on sotalol;asa  6 h/o diabetic neuropathy;on Neurontin 7.Moderate Malnutrition due to acute illness;continue mighty shakes BID.  All the records are reviewed and case discussed with Care Management/Social Workerr. Management plans discussed with the patient, family and they are in agreement.  CODE STATUS: full  TOTAL TIME TAKING CARE OF THIS PATIENT:35 minutes.   POSSIBLE D/C IN 6-7 DAYS, DEPENDING ON CLINICAL CONDITION.   Epifanio Lesches M.D on 08/04/2015 at 4:19 PM  Between 7am to 6pm - Pager - [phone]  After 6pm go to [email or web address] - password [email or web address] [place] [facility]  Office  [phone]  CC: Primary care physician; [name], DO

## 2015-08-04 NOTE — Progress Notes (Signed)
Dr. Marcille Blanco notified of critical K+ of 2.9 this am; acknowledged, new order placed. Barbaraann Faster, RN; 08/04/2015; 6:00 AM

## 2015-08-04 NOTE — Consult Note (Signed)
Patient Demographics  [name], is a 65 y.o. female   MRN: [id number]   DOB - [birth year]  Admit Date - 08/03/2015    Outpatient Primary MD for the patient is [name], DO  Consult requested in the Hospital by Epifanio Lesches, MD, On 08/04/2015    Reason for consult: wound dorsal left foot with a large blister. Also a wound between the third and fourth toes on the right foot. Cellulitis present on foot and lower leg left.   With History of -  Past Medical History  Diagnosis Date  . Sleep apnea     cpap  . Dysrhythmia     a fib  . Hypertension   . Neuropathy   . History of hiatal hernia   . Anemia   . Arthritis   . Diabetes mellitus without complication   . HOH (hard of hearing)     aids  . A-fib       Past Surgical History  Procedure Laterality Date  . Abdominal hysterectomy    . Oophorectomy    . Cardioversion    . Endovenous ablation saphenous vein w/ laser    . Cataract extraction w/phaco Left 07/16/2015    Procedure: CATARACT EXTRACTION PHACO AND INTRAOCULAR LENS PLACEMENT (IOC);  Surgeon: Lyla Glassing, MD;  Location: [facility] ORS;  Service: Ophthalmology;  Laterality: Left;  [place]: 01:16.4 AP%: 12.7 CDE: 9.68 Fluid lot# [id number]    in for   Chief Complaint  Patient presents with  . Blood Infection     HPI  [name]  is a 64 y.o. female, she presented to the emergency room yesterday with a large blister on the top of the left foot. She also had redness and was running a temperature at that time. Her primary care doctor called and also stated she had a ulceration on her right foot in between her third and fourth toes. She's not real sure when his got started. She states may have been a week or so. She is diabetic with a history of peripheral neuropathy.    Review of  Systems    In addition to the HPI above,  No Fever-chills, No Headache, No changes with Vision or hearing, No problems swallowing food or Liquids, No Chest pain, Cough or Shortness of Breath, No Abdominal pain, No Nausea or Vommitting, Bowel movements are regular, No Blood in stool or Urine, No dysuria, No new skin rashes or bruises, No new joints pains-aches,  No new weakness, tingling, numbness in any extremity, No recent weight gain or loss, No polyuria, polydypsia or polyphagia, No significant Mental Stressors.  A full 10 point Review of Systems was done, except as stated above, all other Review of Systems were negative.   Social History Social History  Substance Use Topics  . Smoking status: Former Smoker -- 1.00 packs/day for 40 years    Types: Cigarettes  . Smokeless tobacco: Not on file  . Alcohol Use: 0.6 oz/week    1 Glasses of wine per  week     Comment: occas     Family History Family History  Problem Relation Age of Onset  . Diabetes Mother   . COPD Mother   . Heart disease Mother   . Diabetes Father   . Heart disease Father      Prior to Admission medications   Medication Sig Start Date End Date Taking? Authorizing Provider  acetaminophen (TYLENOL) 500 MG tablet Take 1,000 mg by mouth every 6 (six) hours as needed for mild pain or headache.   Yes Historical Provider, MD  aspirin EC 81 MG tablet Take 81 mg by mouth 2 (two) times daily.   Yes Historical Provider, MD  atorvastatin (LIPITOR) 40 MG tablet Take 40 mg by mouth at bedtime.   Yes Historical Provider, MD  diphenhydrAMINE (BENADRYL) 25 MG tablet Take 25 mg by mouth at bedtime as needed for sleep.   Yes Historical Provider, MD  gabapentin (NEURONTIN) 100 MG capsule Take 100 mg by mouth 3 (three) times daily.    Yes Historical Provider, MD  glimepiride (AMARYL) 2 MG tablet Take 2 mg by mouth daily with breakfast.   Yes Historical Provider, MD  ibuprofen (ADVIL,MOTRIN) 200 MG tablet Take 400 mg by  mouth every 6 (six) hours as needed for headache or mild pain.   Yes Historical Provider, MD  metFORMIN (GLUCOPHAGE) 500 MG tablet Take 500 mg by mouth 2 (two) times daily with a meal.   Yes Historical Provider, MD  Omega-3 Fatty Acids (FISH OIL) 1200 MG CAPS Take 1,200 mg by mouth 2 (two) times daily.   Yes Historical Provider, MD  pyridOXINE (VITAMIN B-6) 100 MG tablet Take 100 mg by mouth at bedtime.    Yes Historical Provider, MD  sotalol (BETAPACE) 80 MG tablet Take 80 mg by mouth 2 (two) times daily.    Yes Historical Provider, MD  vitamin E 400 UNIT capsule Take 400 Units by mouth at bedtime.    Yes Historical Provider, MD    Anti-infectives    Start     Dose/Rate Route Frequency Ordered Stop   08/04/15 0900  vancomycin (VANCOCIN) IVPB 1000 mg/200 mL premix     1,000 mg 200 mL/hr over 60 Minutes Intravenous Every 18 hours 08/03/15 2325     08/03/15 2330  vancomycin (VANCOCIN) IVPB 1000 mg/200 mL premix     1,000 mg 200 mL/hr over 60 Minutes Intravenous  Once 08/03/15 2325 08/04/15 0150   08/03/15 1915  piperacillin-tazobactam (ZOSYN) IVPB 3.375 g     3.375 g 12.5 mL/hr over 240 Minutes Intravenous  Once 08/03/15 1902 08/03/15 2315      Scheduled Meds: . aspirin EC  81 mg Oral BID  . atorvastatin  40 mg Oral QHS  . gabapentin  100 mg Oral TID  . glimepiride  2 mg Oral Q breakfast  . heparin  5,000 Units Subcutaneous 3 times per day  . insulin aspart  0-15 Units Subcutaneous TID WC  . insulin aspart  0-5 Units Subcutaneous QHS  . insulin glargine  13 Units Subcutaneous QHS  . omega-3 acid ethyl esters  1 g Oral Daily  . potassium chloride  40 mEq Oral BID  . pyridOXINE  100 mg Oral QHS  . sotalol  80 mg Oral BID  . vancomycin  1,000 mg Intravenous Q18H  . vitamin E  400 Units Oral QHS   Continuous Infusions: . 0.45 % NaCl with KCl 20 mEq / L 75 mL/hr at 08/04/15 1737   PRN  Meds:.acetaminophen **OR** acetaminophen, acetaminophen, ibuprofen, ondansetron **OR** ondansetron  (ZOFRAN) IV, zolpidem  Allergies  Allergen Reactions  . Codeine Nausea And Vomiting  . Tape Rash    Physical Exam  Vitals  Blood pressure 133/51, pulse 93, temperature 102.7 F (39.3 C), temperature source Oral, resp. rate 18, height 5\' 2"  (1.575 m), weight 62.914 kg (138 lb 11.2 oz), SpO2 97 %.  Lower Extremity exam:  Vascular: Pulses are nonpalpable bilaterally. Examination of the left foot shows severe cyanosis to the digits with no capillary refill time. Right foot has better coloration but still nonpalpable pulses and slow capillary refill times.  Dermatological: Dorsal left foot has a large blister approximately 6 x 5 cm in diameter it is filled with a very dark fluid. There is a small ischemic necrotic area to the distal fourth toe. The right foot has an ulceration full thickness between the third and fourth toes that penetrates into the fatty tissue and fatty layers hopefully not to the capsular tissue. This does not have much cellulitis around it. The left foot has significant cellulitis from the midcalf down to the midfoot.  Neurological: Light touch and vibratory sensations are absent bilaterally  Ortho: Contracture of most of her toes at this juncture.  Data Review  CBC  Recent Labs Lab 08/03/15 1716 08/04/15 0432  WBC 32.8* 23.7*  HGB 11.0* 9.6*  HCT 34.3* 29.0*  PLT 409 342  MCV 81.0 79.8*  MCH 26.1 26.4  MCHC 32.2 33.0  RDW 13.5 13.5  LYMPHSABS 5.2*  --   MONOABS 1.7*  --   EOSABS 0.0  --   BASOSABS 0.0  --    ------------------------------------------------------------------------------------------------------------------  Chemistries   Recent Labs Lab 08/03/15 1716 08/04/15 0432  NA 125* 131*  K 3.1* 2.9*  CL 87* 98*  CO2 20* 23  GLUCOSE 327* 356*  BUN 12 11  CREATININE 0.89 0.63  CALCIUM 8.3* 7.7*  AST 29  --   ALT 25  --   ALKPHOS 138*  --   BILITOT 1.5*  --     ------------------------------------------------------------------------------------------------------------------ estimated creatinine clearance is 61.1 mL/min (by C-G formula based on Cr of 0.63). ------------------------------------------------------------------------------------------------------------------ No results for input(s): TSH, T4TOTAL, T3FREE, THYROIDAB in the last 72 hours.  Invalid input(s): FREET3   Coagulation profile No results for input(s): INR, PROTIME in the last 168 hours. ------------------------------------------------------------------------------------------------------------------- No results for input(s): DDIMER in the last 72 hours. -------------------------------------------------------------------------------------------------------------------  Cardiac Enzymes No results for input(s): CKMB, TROPONINI, MYOGLOBIN in the last 168 hours.  Invalid input(s): CK ------------------------------------------------------------------------------------------------------------------ Invalid input(s): POCBNP   ---------------------------------------------------------------------------------------------------------------  Urinalysis    Component Value Date/Time   COLORURINE AMBER* 08/03/2015 1810   APPEARANCEUR CLOUDY* 08/03/2015 1810   LABSPEC 1.016 08/03/2015 1810   PHURINE 5.0 08/03/2015 1810   GLUCOSEU >500* 08/03/2015 1810   HGBUR NEGATIVE 08/03/2015 1810   BILIRUBINUR NEGATIVE 08/03/2015 1810   KETONESUR 1+* 08/03/2015 1810   PROTEINUR 100* 08/03/2015 1810   NITRITE NEGATIVE 08/03/2015 1810   LEUKOCYTESUR TRACE* 08/03/2015 1810     Imaging results:   Dg Chest 2 View  08/03/2015   CLINICAL DATA:  Sepsis, lower extremity infection, diabetes  EXAM: CHEST  2 VIEW  COMPARISON:  10/31/2009  FINDINGS: Mild cardiomegaly again noted without evidence for edema. No focal pulmonary opacity. No pleural effusion. Mild pectus excavatum deformity. Bones are  subjectively osteopenic. No acute osseous abnormality.  IMPRESSION: Mild cardiomegaly without focal acute finding.   Electronically Signed   By: Roena Malady.D.  On: 08/03/2015 18:10   Dg Foot 2 Views Left  08/04/2015   CLINICAL DATA:  Cellulitis, nonhealing wound on the foot  EXAM: LEFT FOOT - 2 VIEW  COMPARISON:  None.  FINDINGS: Dorsal soft tissue swelling and irregularity noted. There is subjacent osteopenia of the base of the first metatarsal with coarsening of the trabecular markings involving the navicular bone most prominently. No radiopaque foreign body. No acute fracture or dislocation. Plantar calcaneal spurring.  IMPRESSION: Dorsal soft tissue swelling with subjacent irregular bony trabeculation as described above. This could indicate osteomyelitis.   Electronically Signed   By: Conchita Paris M.D.   On: 08/04/2015 17:25        Assessment & Plan: The patient has an ischemic left lower extremity. These appear to be gangrenous changes to the dorsum of the left foot and a wide swath underneath the blister. The blister was drained and significant discoloration was noted for that issue approximately 6 x 5 cm across the dorsal medial aspect of her left foot. Toes on that left foot are cold and ischemic as well. Right foot has a full-thickness diabetic ulcer that shows minimal healing and she states has been there for several weeks. Likely exacerbated by her compression stockings squeezing the toes together.  Plan: I called Dr. Lucky Cowboy from [facility] vascular and see if he could work her as soon as possible for possible exploration catheterization and hopefully angioplasty to improve blood flow to this lower extremity. I'm concerned that the degree of tissue damage on the left foot will result in significant tissue loss even with a good reconstruction. The right foot we will put dressings in between the toes to keep them separated that foot appears to be stable at this juncture compared to the  left. Of paramount concern is revascularization to the left lower extremity. Dr. Lucky Cowboy expressed the possibility of getting her into the operating room tomorrow which would be a big help. We will have to assess her status after that as far as limb salvage is concerned.   Active Problems:   Cellulitis   Malnutrition of moderate degree     Family Communication: Plan discussed with patient and her daughter. I explained the gravity of her situation and they seem to have an understanding of this. They understand this may result with limb loss to that left lower extremity and the right foot also has significant issues that could result in partial loss of the foot or limb loss.   Thank you for the consult, we will follow the patient with you in the Hospital.   Perry Mount M.D on 08/04/2015 at 6:06 PM  Thank you for the consult, we will follow the patient with you in the Hospital.

## 2015-08-05 ENCOUNTER — Encounter
Admission: EM | Disposition: A | Discharge status: Home / Self Care | Payer: Self-pay | Source: Home / Self Care | Attending: Internal Medicine

## 2015-08-05 HISTORY — PX: PERIPHERAL VASCULAR CATHETERIZATION: SHX172C

## 2015-08-05 LAB — BASIC METABOLIC PANEL
Anion gap: 6 (ref 5–15)
BUN: 13 mg/dL (ref 6–20)
CO2: 23 mmol/L (ref 22–32)
Calcium: 8.1 mg/dL — ABNORMAL LOW (ref 8.9–10.3)
Chloride: 101 mmol/L (ref 101–111)
Creatinine, Ser: 1.12 mg/dL — ABNORMAL HIGH (ref 0.44–1.00)
GFR calc Af Amer: 58 mL/min — ABNORMAL LOW (ref >60)
GFR, EST NON AFRICAN AMERICAN: 50 mL/min — AB (ref >60)
Glucose, Bld: 173 mg/dL — ABNORMAL HIGH (ref 65–99)
POTASSIUM: 5.3 mmol/L — AB (ref 3.5–5.1)
SODIUM: 130 mmol/L — AB (ref 135–145)

## 2015-08-05 LAB — GLUCOSE, CAPILLARY
GLUCOSE-CAPILLARY: 165 mg/dL — AB (ref 65–99)
Glucose-Capillary: 140 mg/dL — ABNORMAL HIGH (ref 65–99)
Glucose-Capillary: 134 mg/dL — ABNORMAL HIGH (ref 65–99)
Glucose-Capillary: 156 mg/dL — ABNORMAL HIGH (ref 65–99)

## 2015-08-05 LAB — CBC
HEMATOCRIT: 26.8 % — AB (ref 35.0–47.0)
HEMOGLOBIN: 8.5 g/dL — AB (ref 12.0–16.0)
MCH: 25.4 pg — ABNORMAL LOW (ref 26.0–34.0)
MCHC: 31.7 g/dL — ABNORMAL LOW (ref 32.0–36.0)
MCV: 79.9 fL — ABNORMAL LOW (ref 80.0–100.0)
Platelets: 317 10*3/uL (ref 150–440)
RBC: 3.35 MIL/uL — AB (ref 3.80–5.20)
RDW: 13.6 % (ref 11.5–14.5)
WBC: 21.3 10*3/uL — AB (ref 3.6–11.0)

## 2015-08-05 LAB — URINE CULTURE

## 2015-08-05 LAB — VANCOMYCIN, TROUGH: VANCOMYCIN TR: 16 ug/mL (ref 10–20)

## 2015-08-05 SURGERY — LOWER EXTREMITY ANGIOGRAPHY
Anesthesia: Moderate Sedation | Laterality: Left

## 2015-08-05 MED ORDER — ALTEPLASE 2 MG IJ SOLR
INTRAMUSCULAR | Status: AC
  Filled 2015-08-05: qty 8

## 2015-08-05 MED ORDER — DIPHENHYDRAMINE HCL 50 MG/ML IJ SOLN
INTRAMUSCULAR | Status: DC | PRN
  Administered 2015-08-05: 50 mg via INTRAVENOUS

## 2015-08-05 MED ORDER — HEPARIN SODIUM (PORCINE) 1000 UNIT/ML IJ SOLN
INTRAMUSCULAR | Status: DC | PRN
  Administered 2015-08-05: 4000 [IU] via INTRAVENOUS

## 2015-08-05 MED ORDER — FENTANYL CITRATE (PF) 100 MCG/2ML IJ SOLN
INTRAMUSCULAR | Status: AC
  Filled 2015-08-05: qty 2

## 2015-08-05 MED ORDER — MIDAZOLAM HCL 2 MG/2ML IJ SOLN
INTRAMUSCULAR | Status: DC | PRN
  Administered 2015-08-05: 1 mg via INTRAVENOUS
  Administered 2015-08-05: 2 mg via INTRAVENOUS

## 2015-08-05 MED ORDER — CEFAZOLIN SODIUM 1-5 GM-% IV SOLN
INTRAVENOUS | Status: AC
  Filled 2015-08-05: qty 50

## 2015-08-05 MED ORDER — MIDAZOLAM HCL 5 MG/5ML IJ SOLN
INTRAMUSCULAR | Status: AC
  Filled 2015-08-05: qty 5

## 2015-08-05 MED ORDER — DIPHENHYDRAMINE HCL 50 MG/ML IJ SOLN
INTRAMUSCULAR | Status: AC
  Filled 2015-08-05: qty 1

## 2015-08-05 MED ORDER — CHLORHEXIDINE GLUCONATE CLOTH 2 % EX PADS
6.0000 | MEDICATED_PAD | Freq: Once | CUTANEOUS | Status: AC
Start: 2015-08-05 — End: 2015-08-05
  Administered 2015-08-05: 6 via TOPICAL

## 2015-08-05 MED ORDER — IOHEXOL 300 MG/ML  SOLN
INTRAMUSCULAR | Status: DC | PRN
Start: 2015-08-05 — End: 2015-08-05
  Administered 2015-08-05: 65 mL via INTRA_ARTERIAL

## 2015-08-05 MED ORDER — FENTANYL CITRATE (PF) 100 MCG/2ML IJ SOLN
INTRAMUSCULAR | Status: DC | PRN
  Administered 2015-08-05 (×2): 50 ug via INTRAVENOUS

## 2015-08-05 MED ORDER — SODIUM CHLORIDE 0.9 % IV SOLN
INTRAVENOUS | Status: DC
  Administered 2015-08-05: 14:00:00 via INTRAVENOUS

## 2015-08-05 MED ORDER — ENOXAPARIN SODIUM 40 MG/0.4ML ~~LOC~~ SOLN
40.0000 mg | SUBCUTANEOUS | Status: DC
  Administered 2015-08-05 – 2015-08-12 (×7): 40 mg via SUBCUTANEOUS
  Filled 2015-08-05 (×7): qty 0.4

## 2015-08-05 MED ORDER — LIDOCAINE-EPINEPHRINE (PF) 1 %-1:200000 IJ SOLN
INTRAMUSCULAR | Status: AC
  Filled 2015-08-05: qty 30

## 2015-08-05 MED ORDER — SODIUM CHLORIDE 0.9 % IV SOLN
INTRAVENOUS | Status: DC
Start: 2015-08-05 — End: 2015-08-05

## 2015-08-05 MED ORDER — HEPARIN SODIUM (PORCINE) 1000 UNIT/ML IJ SOLN
INTRAMUSCULAR | Status: AC
  Filled 2015-08-05: qty 1

## 2015-08-05 MED ORDER — HEPARIN (PORCINE) IN NACL 2-0.9 UNIT/ML-% IJ SOLN
INTRAMUSCULAR | Status: AC
  Filled 2015-08-05: qty 1000

## 2015-08-05 SURGICAL SUPPLY — 19 items
BALLN LUTONIX 5X150X130 (BALLOONS) ×3
BALLN ULTRVRSE 2.5X220X150 (BALLOONS) ×3
BALLN ULTRVRSE 3X80X150 (BALLOONS) ×1
BALLN ULTRVRSE 3X80X150 OTW (BALLOONS) ×2
BALLOON LUTONIX 5X150X130 (BALLOONS) ×2 IMPLANT
BALLOON ULTRVRSE 2.5X220X150 (BALLOONS) ×2 IMPLANT
BALLOON ULTRVRSE 3X80X150 OTW (BALLOONS) ×2 IMPLANT
CATH PIG 70CM (CATHETERS) ×3 IMPLANT
CATH VERT 100CM (CATHETERS) ×3 IMPLANT
DEVICE PRESTO INFLATION (MISCELLANEOUS) ×3 IMPLANT
DEVICE STARCLOSE SE CLOSURE (Vascular Products) ×3 IMPLANT
GLIDEWIRE ADV .035X260CM (WIRE) ×3 IMPLANT
PACK ANGIOGRAPHY (CUSTOM PROCEDURE TRAY) ×3 IMPLANT
SHEATH ANL2 6FRX45 HC (SHEATH) ×3 IMPLANT
SHEATH BRITE TIP 5FRX11 (SHEATH) ×3 IMPLANT
SYR MEDRAD MARK V 150ML (SYRINGE) ×3 IMPLANT
TUBING CONTRAST HIGH PRESS 72 (TUBING) ×3 IMPLANT
WIRE G V18X300CM (WIRE) ×3 IMPLANT
WIRE J 3MM .035X145CM (WIRE) ×3 IMPLANT

## 2015-08-05 NOTE — Consult Note (Signed)
[facility]     Reason for Consult:Gangrene     Referring Physician: Hillary Bow Date of Admission:  08/03/2015   Active Problems:   Cellulitis   Malnutrition of moderate degree   HPI: [name] is a 64 y.o. female with a known history of diabetes, diabetic neuropathy, hypertension, history of atrial fibrillation, osteoarthritis who presented to the hospital due to left lower extremity strength redness and swelling and blister formation. Patient noticed a fluid blister getting increasing in size this past Saturday. She has since then noticed a redness streaking up her left leg with some increasing pain and warmth to the left lower extremity and therefore came to the ER for further evaluation. Patient was noted to have an acute left lower extremity cellulitis with blister formation and therefore hospitalist services were contacted further treatment and evaluation.   WBC was 32 and T 102.7. She has been seen by Vascular and podiatry. Has undergone revascularization   Angiogram Findings:  Aortogram: No aortoiliac stenosis, renal arteries appeared widely patent Left Lower Extremity: Diffuse multilevel disease improved with intervention.  Past Medical History  Diagnosis Date  . Sleep apnea     cpap  . Dysrhythmia     a fib  . Hypertension   . Neuropathy   . History of hiatal hernia   . Anemia   . Arthritis   . Diabetes mellitus without complication   . HOH (hard of hearing)     aids  . A-fib    Past Surgical History  Procedure Laterality Date  . Abdominal hysterectomy    . Oophorectomy    . Cardioversion    . Endovenous ablation saphenous vein w/ laser    . Cataract extraction w/phaco Left 07/16/2015    Procedure: CATARACT EXTRACTION PHACO AND INTRAOCULAR LENS PLACEMENT (IOC);  Surgeon: Lyla Glassing, MD;  Location: [facility] ORS;  Service: Ophthalmology;  Laterality: Left;  [place]: 01:16.4 AP%: 12.7 CDE: 9.68 Fluid lot#  [id number]   Social History  Substance Use Topics  . Smoking status: Former Smoker -- 1.00 packs/day for 40 years    Types: Cigarettes  . Smokeless tobacco: None  . Alcohol Use: 0.6 oz/week    1 Glasses of wine per week     Comment: occas   Family History  Problem Relation Age of Onset  . Diabetes Mother   . COPD Mother   . Heart disease Mother   . Diabetes Father   . Heart disease Father     Allergies:  Allergies  Allergen Reactions  . Codeine Nausea And Vomiting  . Tape Rash    Current antibiotics: Antibiotics Given (last 72 hours)    Date/Time Action Medication Dose Rate   08/04/15 0050 Given   vancomycin (VANCOCIN) IVPB 1000 mg/200 mL premix 1,000 mg 200 mL/hr   08/04/15 0943 Given   vancomycin (VANCOCIN) IVPB 1000 mg/200 mL premix 1,000 mg 200 mL/hr   08/05/15 0223 Given   vancomycin (VANCOCIN) IVPB 1000 mg/200 mL premix 1,000 mg 200 mL/hr      MEDICATIONS: . aspirin EC  81 mg Oral BID  . atorvastatin  40 mg Oral QHS  . enoxaparin (LOVENOX) injection  40 mg Subcutaneous Q24H  . gabapentin  100 mg Oral TID  . glimepiride  2 mg Oral Q breakfast  . insulin aspart  0-15 Units Subcutaneous TID WC  . insulin aspart  0-5 Units Subcutaneous QHS  . insulin glargine  13 Units Subcutaneous QHS  . omega-3 acid ethyl  esters  1 g Oral Daily  . pyridOXINE  100 mg Oral QHS  . sotalol  80 mg Oral BID  . vancomycin  1,000 mg Intravenous Q18H  . vitamin E  400 Units Oral QHS    Review of Systems - 11 systems reviewed and negative per HPI   OBJECTIVE: Temp:  [98.7 F (37.1 C)-100.9 F (38.3 C)] 100.9 F (38.3 C) (08/31 1453) Pulse Rate:  [57-76] 76 (08/31 1453) Resp:  [17] 17 (08/31 0033) BP: (101-117)/(44-65) 117/65 mmHg (08/31 1453) SpO2:  [96 %-100 %] 96 % (08/31 1453) Physical Exam  Constitutional:  oriented to person, place, and time. appears well-developed chronically ill appearing HENT: [place]/AT, PERRLA, no scleral icterus Mouth/Throat: Oropharynx is clear and  moist. No oropharyngeal exudate.  Cardiovascular: Normal rate, regular rhythm and normal heart sounds. Pulmonary/Chest: Effort normal and breath sounds normal. No respiratory distress.  has no wheezes.  Neck = supple, no nuchal rigidity Abdominal: Soft. Bowel sounds are normal.  exhibits no distension. There is no tenderness.  Lymphadenopathy: no cervical adenopathy. No axillary adenopathy Neurological: alert and oriented to person, place, and time.  Skin: L foot is cool distally but ankle and shin are warm and red Has large necrotic eschar dorsally. TTP  R foot with open drainage area dosally between 3-4 toes Psychiatric: a normal mood and affect.  behavior is normal.   LABS: Results for orders placed or performed during the hospital encounter of 08/03/15 (from the past 48 hour(s))  Culture, blood (routine x 2)     Status: None (Preliminary result)   Collection Time: 08/03/15  5:12 PM  Result Value Ref Range   Specimen Description BLOOD RIGHT ARM    Special Requests BOTTLES DRAWN AEROBIC AND ANAEROBIC [place]    Culture NO GROWTH 2 DAYS    Report Status PENDING   Culture, blood (routine x 2)     Status: None (Preliminary result)   Collection Time: 08/03/15  5:15 PM  Result Value Ref Range   Specimen Description BLOOD LEFT ARM    Special Requests BOTTLES DRAWN AEROBIC AND ANAEROBIC 4CC    Culture  Setup Time      GRAM POSITIVE COCCI IN PAIRS IN CHAINS AEROBIC BOTTLE ONLY CRITICAL RESULT CALLED TO, READ BACK BY AND VERIFIED WITH: MICHAEL JACOBS 08/04/15 1320 JGF    Culture      VIRIDANS STREPTOCOCCUS AEROBIC BOTTLE ONLY Results consistent with contamination.    Report Status PENDING   Hemoglobin A1c     Status: Abnormal   Collection Time: 08/03/15  5:15 PM  Result Value Ref Range   Hgb A1c MFr Bld 11.9 (H) 4.0 - 6.0 %  Comprehensive metabolic panel     Status: Abnormal   Collection Time: 08/03/15  5:16 PM  Result Value Ref Range   Sodium 125 (L) 135 - 145 mmol/L   Potassium 3.1  (L) 3.5 - 5.1 mmol/L   Chloride 87 (L) 101 - 111 mmol/L   CO2 20 (L) 22 - 32 mmol/L   Glucose, Bld 327 (H) 65 - 99 mg/dL   BUN 12 6 - 20 mg/dL   Creatinine, Ser 0.89 0.44 - 1.00 mg/dL   Calcium 8.3 (L) 8.9 - 10.3 mg/dL   Total Protein 7.8 6.5 - 8.1 g/dL   Albumin 2.6 (L) 3.5 - 5.0 g/dL   AST 29 15 - 41 U/L   ALT 25 14 - 54 U/L   Alkaline Phosphatase 138 (H) 38 - 126 U/L   Total Bilirubin  1.5 (H) 0.3 - 1.2 mg/dL   GFR calc non Af Amer >60 >60 mL/min   GFR calc Af Amer >60 >60 mL/min    Comment: (NOTE) The eGFR has been calculated using the CKD EPI equation. This calculation has not been validated in all clinical situations. eGFR's persistently <60 mL/min signify possible Chronic Kidney Disease.    Anion gap 18 (H) 5 - 15  CBC with Differential     Status: Abnormal   Collection Time: 08/03/15  5:16 PM  Result Value Ref Range   WBC 32.8 (H) 3.6 - 11.0 K/uL   RBC 4.23 3.80 - 5.20 MIL/uL   Hemoglobin 11.0 (L) 12.0 - 16.0 g/dL   HCT 34.3 (L) 35.0 - 47.0 %   MCV 81.0 80.0 - 100.0 fL   MCH 26.1 26.0 - 34.0 pg   MCHC 32.2 32.0 - 36.0 g/dL   RDW 13.5 11.5 - 14.5 %   Platelets 409 150 - 440 K/uL   Neutrophils Relative % 79 %   Neutro Abs 26.0 (H) 1.4 - 6.5 K/uL   Lymphocytes Relative 16 %   Lymphs Abs 5.2 (H) 1.0 - 3.6 K/uL   Monocytes Relative 5 %   Monocytes Absolute 1.7 (H) 0.2 - 0.9 K/uL   Eosinophils Relative 0 %   Eosinophils Absolute 0.0 0 - 0.7 K/uL   Basophils Relative 0 %   Basophils Absolute 0.0 0 - 0.1 K/uL  Lactic acid, plasma     Status: None   Collection Time: 08/03/15  5:58 PM  Result Value Ref Range   Lactic Acid, Venous 1.3 0.5 - 2.0 mmol/L  Urine culture     Status: None   Collection Time: 08/03/15  6:10 PM  Result Value Ref Range   Specimen Description URINE, RANDOM    Special Requests NONE    Culture MULTIPLE SPECIES PRESENT, SUGGEST RECOLLECTION    Report Status 08/05/2015 FINAL   Urinalysis complete, with microscopic ([facility] only)     Status: Abnormal    Collection Time: 08/03/15  6:10 PM  Result Value Ref Range   Color, Urine AMBER (A) YELLOW   APPearance CLOUDY (A) CLEAR   Glucose, UA >500 (A) NEGATIVE mg/dL   Bilirubin Urine NEGATIVE NEGATIVE   Ketones, ur 1+ (A) NEGATIVE mg/dL   Specific Gravity, Urine 1.016 1.005 - 1.030   Hgb urine dipstick NEGATIVE NEGATIVE   pH 5.0 5.0 - 8.0   Protein, ur 100 (A) NEGATIVE mg/dL   Nitrite NEGATIVE NEGATIVE   Leukocytes, UA TRACE (A) NEGATIVE   RBC / HPF 0-5 0 - 5 RBC/hpf   WBC, UA 6-30 0 - 5 WBC/hpf   Bacteria, UA RARE (A) NONE SEEN   Squamous Epithelial / LPF 6-30 (A) NONE SEEN   Mucous PRESENT    Hyaline Casts, UA PRESENT   Lactic acid, plasma     Status: None   Collection Time: 08/03/15 10:18 PM  Result Value Ref Range   Lactic Acid, Venous 1.0 0.5 - 2.0 mmol/L  Glucose, capillary     Status: Abnormal   Collection Time: 08/03/15 11:02 PM  Result Value Ref Range   Glucose-Capillary 270 (H) 65 - 99 mg/dL  Basic metabolic panel     Status: Abnormal   Collection Time: 08/04/15  4:32 AM  Result Value Ref Range   Sodium 131 (L) 135 - 145 mmol/L   Potassium 2.9 (LL) 3.5 - 5.1 mmol/L    Comment: CRITICAL RESULT CALLED TO, READ BACK BY AND VERIFIED  WITH MARCELLA TURNER 08/04/2015 0534 LKH   Chloride 98 (L) 101 - 111 mmol/L   CO2 23 22 - 32 mmol/L   Glucose, Bld 356 (H) 65 - 99 mg/dL   BUN 11 6 - 20 mg/dL   Creatinine, Ser 0.63 0.44 - 1.00 mg/dL   Calcium 7.7 (L) 8.9 - 10.3 mg/dL   GFR calc non Af Amer >60 >60 mL/min   GFR calc Af Amer >60 >60 mL/min    Comment: (NOTE) The eGFR has been calculated using the CKD EPI equation. This calculation has not been validated in all clinical situations. eGFR's persistently <60 mL/min signify possible Chronic Kidney Disease.    Anion gap 10 5 - 15  CBC     Status: Abnormal   Collection Time: 08/04/15  4:32 AM  Result Value Ref Range   WBC 23.7 (H) 3.6 - 11.0 K/uL   RBC 3.63 (L) 3.80 - 5.20 MIL/uL   Hemoglobin 9.6 (L) 12.0 - 16.0 g/dL   HCT  29.0 (L) 35.0 - 47.0 %   MCV 79.8 (L) 80.0 - 100.0 fL   MCH 26.4 26.0 - 34.0 pg   MCHC 33.0 32.0 - 36.0 g/dL   RDW 13.5 11.5 - 14.5 %   Platelets 342 150 - 440 K/uL  Glucose, capillary     Status: Abnormal   Collection Time: 08/04/15  8:45 AM  Result Value Ref Range   Glucose-Capillary 368 (H) 65 - 99 mg/dL  Glucose, capillary     Status: Abnormal   Collection Time: 08/04/15 11:17 AM  Result Value Ref Range   Glucose-Capillary 374 (H) 65 - 99 mg/dL  Glucose, capillary     Status: Abnormal   Collection Time: 08/04/15  4:55 PM  Result Value Ref Range   Glucose-Capillary 178 (H) 65 - 99 mg/dL  Wound culture     Status: None (Preliminary result)   Collection Time: 08/04/15  6:00 PM  Result Value Ref Range   Specimen Description FOOT    Special Requests Immunocompromised    Gram Stain PENDING    Culture NO GROWTH < 12 HOURS    Report Status PENDING   Glucose, capillary     Status: Abnormal   Collection Time: 08/04/15  9:12 PM  Result Value Ref Range   Glucose-Capillary 159 (H) 65 - 99 mg/dL  CBC     Status: Abnormal   Collection Time: 08/05/15  4:51 AM  Result Value Ref Range   WBC 21.3 (H) 3.6 - 11.0 K/uL   RBC 3.35 (L) 3.80 - 5.20 MIL/uL   Hemoglobin 8.5 (L) 12.0 - 16.0 g/dL   HCT 26.8 (L) 35.0 - 47.0 %   MCV 79.9 (L) 80.0 - 100.0 fL   MCH 25.4 (L) 26.0 - 34.0 pg   MCHC 31.7 (L) 32.0 - 36.0 g/dL   RDW 13.6 11.5 - 14.5 %   Platelets 317 150 - 440 K/uL  Glucose, capillary     Status: Abnormal   Collection Time: 08/05/15  7:13 AM  Result Value Ref Range   Glucose-Capillary 165 (H) 65 - 99 mg/dL  Basic metabolic panel     Status: Abnormal   Collection Time: 08/05/15  9:43 AM  Result Value Ref Range   Sodium 130 (L) 135 - 145 mmol/L   Potassium 5.3 (H) 3.5 - 5.1 mmol/L   Chloride 101 101 - 111 mmol/L   CO2 23 22 - 32 mmol/L   Glucose, Bld 173 (H) 65 - 99 mg/dL  BUN 13 6 - 20 mg/dL   Creatinine, Ser 1.12 (H) 0.44 - 1.00 mg/dL   Calcium 8.1 (L) 8.9 - 10.3 mg/dL   GFR  calc non Af Amer 50 (L) >60 mL/min   GFR calc Af Amer 58 (L) >60 mL/min    Comment: (NOTE) The eGFR has been calculated using the CKD EPI equation. This calculation has not been validated in all clinical situations. eGFR's persistently <60 mL/min signify possible Chronic Kidney Disease.    Anion gap 6 5 - 15  Glucose, capillary     Status: Abnormal   Collection Time: 08/05/15 11:24 AM  Result Value Ref Range   Glucose-Capillary 140 (H) 65 - 99 mg/dL   No components found for: ESR, C REACTIVE PROTEIN MICRO: Recent Results (from the past 720 hour(s))  Culture, blood (routine x 2)     Status: None (Preliminary result)   Collection Time: 08/03/15  5:12 PM  Result Value Ref Range Status   Specimen Description BLOOD RIGHT ARM  Final   Special Requests BOTTLES DRAWN AEROBIC AND ANAEROBIC [place]  Final   Culture NO GROWTH 2 DAYS  Final   Report Status PENDING  Incomplete  Culture, blood (routine x 2)     Status: None (Preliminary result)   Collection Time: 08/03/15  5:15 PM  Result Value Ref Range Status   Specimen Description BLOOD LEFT ARM  Final   Special Requests BOTTLES DRAWN AEROBIC AND ANAEROBIC 4CC  Final   Culture  Setup Time   Final    GRAM POSITIVE COCCI IN PAIRS IN CHAINS AEROBIC BOTTLE ONLY CRITICAL RESULT CALLED TO, READ BACK BY AND VERIFIED WITH: MICHAEL JACOBS 08/04/15 1320 JGF    Culture   Final    VIRIDANS STREPTOCOCCUS AEROBIC BOTTLE ONLY Results consistent with contamination.    Report Status PENDING  Incomplete  Urine culture     Status: None   Collection Time: 08/03/15  6:10 PM  Result Value Ref Range Status   Specimen Description URINE, RANDOM  Final   Special Requests NONE  Final   Culture MULTIPLE SPECIES PRESENT, SUGGEST RECOLLECTION  Final   Report Status 08/05/2015 FINAL  Final  Wound culture     Status: None (Preliminary result)   Collection Time: 08/04/15  6:00 PM  Result Value Ref Range Status   Specimen Description FOOT  Final   Special Requests  Immunocompromised  Final   Gram Stain PENDING  Incomplete   Culture NO GROWTH < 12 HOURS  Final   Report Status PENDING  Incomplete    IMAGING: Dg Chest 2 View  08/03/2015   CLINICAL DATA:  Sepsis, lower extremity infection, diabetes  EXAM: CHEST  2 VIEW  COMPARISON:  10/31/2009  FINDINGS: Mild cardiomegaly again noted without evidence for edema. No focal pulmonary opacity. No pleural effusion. Mild pectus excavatum deformity. Bones are subjectively osteopenic. No acute osseous abnormality.  IMPRESSION: Mild cardiomegaly without focal acute finding.   Electronically Signed   By: Conchita Paris M.D.   On: 08/03/2015 18:10   Dg Foot 2 Views Left  08/04/2015   CLINICAL DATA:  Cellulitis, nonhealing wound on the foot  EXAM: LEFT FOOT - 2 VIEW  COMPARISON:  None.  FINDINGS: Dorsal soft tissue swelling and irregularity noted. There is subjacent osteopenia of the base of the first metatarsal with coarsening of the trabecular markings involving the navicular bone most prominently. No radiopaque foreign body. No acute fracture or dislocation. Plantar calcaneal spurring.  IMPRESSION: Dorsal soft tissue swelling  with subjacent irregular bony trabeculation as described above. This could indicate osteomyelitis.   Electronically Signed   By: Conchita Paris M.D.   On: 08/04/2015 17:25    Assessment:   [name] is a 64 y.o. female with very poorly controlled DM (A1C> 11), PN and PVD admitted with gangrenous changes of L foot and ulceration of R foot.  She has had revascularization. She has had fevers and elevated wbc.  Has been on IV abx with some improvement since angiogram. Bcx + for Grp B strep and viridans strep in 1/2 bcx.   Recommendations Continue vanco and zosyn.  Will likely benefit from several weeks of abx treatment while area demarcates and further operative planning can take place.   Will check esr and crp Would place picc and when stable for discharge abx selection will be based on  cultures from wound.     Thank you very much for allowing me to participate in the care of this patient. Please call with questions.   Cheral Marker. Ola Spurr, MD

## 2015-08-05 NOTE — Progress Notes (Signed)
PCP Note:  I visited [name] early this morning.  I greatly appreciate the wonderful care being provided at [facility], especially with Hospitalist service and Dr. Elvina Mattes.    If I can be on any assistance please let me know.  Cell [phone].  Once again, thank you  [name] DO [facility]

## 2015-08-05 NOTE — Progress Notes (Addendum)
[facility] [place] NAME: [name]    MR#:  [id number]  DATE OF BIRTH:  1951-07-09  SUBJECTIVE:  CHIEF COMPLAINT:   Chief Complaint  Patient presents with  . Blood Infection   Today patient still has pain left foot. Febrile. Tmax 102.7  REVIEW OF SYSTEMS:   ROS CONSTITUTIONAL: No fever, fatigue or weakness.  EYES: No blurred or double vision.  EARS, NOSE, AND THROAT: No tinnitus or ear pain.  RESPIRATORY: No cough, shortness of breath, wheezing or hemoptysis.  CARDIOVASCULAR: No chest pain, orthopnea, edema.  GASTROINTESTINAL: No nausea, vomiting, diarrhea or abdominal pain.  GENITOURINARY: No dysuria, hematuria.  ENDOCRINE: No polyuria, nocturia,  HEMATOLOGY: No anemia, easy bruising or bleeding SKIN: Left foot redness, ulcer MUSCULOSKELETAL: left  Leg cellulitis/left foot dorsum has black discolaration,appears like dry gangrene   NEUROLOGIC: h/o neuropathy  PSYCHIATRY: No anxiety or depression.   DRUG ALLERGIES:   Allergies  Allergen Reactions  . Codeine Nausea And Vomiting  . Tape Rash    VITALS:  Blood pressure 112/59, pulse 75, temperature 98.7 F (37.1 C), temperature source Oral, resp. rate 17, height 5\' 2"  (1.575 m), weight 62.914 kg (138 lb 11.2 oz), SpO2 100 %.  PHYSICAL EXAMINATION:  GENERAL:  64 y.o.-year-old patient lying in the bed with no acute distress.  EYES: Pupils equal, round, reactive to light and accommodation. No scleral icterus. Extraocular muscles intact.  HEENT: Head atraumatic, normocephalic. Oropharynx and nasopharynx clear.  NECK:  Supple, no jugular venous distention. No thyroid enlargement, no tenderness.  LUNGS: Normal breath sounds bilaterally, no wheezing, rales,rhonchi or crepitation. No use of accessory muscles of respiration.  CARDIOVASCULAR: S1, S2 normal. No murmurs, rubs, or gallops.  ABDOMEN: Soft, nontender, nondistended. Bowel sounds present. No organomegaly or mass.   EXTREMITIES: left foot gangrene/left leg celulitis NEUROLOGIC: Cranial nerves II through XII are intact. Muscle strength 5/5 in all extremities. Sensation intact. Gait not checked.  PSYCHIATRIC: The patient is alert and oriented x 3.  SKIN: left foot gangrene Decreased pulses b/l feet and cyanosis left foot   LABORATORY PANEL:   CBC  Recent Labs Lab 08/05/15 0451  WBC 21.3*  HGB 8.5*  HCT 26.8*  PLT 317   ------------------------------------------------------------------------------------------------------------------  Chemistries   Recent Labs Lab 08/03/15 1716  08/05/15 0943  NA 125*  < > 130*  K 3.1*  < > 5.3*  CL 87*  < > 101  CO2 20*  < > 23  GLUCOSE 327*  < > 173*  BUN 12  < > 13  CREATININE 0.89  < > 1.12*  CALCIUM 8.3*  < > 8.1*  AST 29  --   --   ALT 25  --   --   ALKPHOS 138*  --   --   BILITOT 1.5*  --   --   < > = values in this interval not displayed. ------------------------------------------------------------------------------------------------------------------  Cardiac Enzymes No results for input(s): TROPONINI in the last 168 hours. ------------------------------------------------------------------------------------------------------------------  RADIOLOGY:  Dg Chest 2 View  08/03/2015   CLINICAL DATA:  Sepsis, lower extremity infection, diabetes  EXAM: CHEST  2 VIEW  COMPARISON:  10/31/2009  FINDINGS: Mild cardiomegaly again noted without evidence for edema. No focal pulmonary opacity. No pleural effusion. Mild pectus excavatum deformity. Bones are subjectively osteopenic. No acute osseous abnormality.  IMPRESSION: Mild cardiomegaly without focal acute finding.   Electronically Signed   By: Conchita Paris M.D.   On: 08/03/2015 18:10  Dg Foot 2 Views Left  08/04/2015   CLINICAL DATA:  Cellulitis, nonhealing wound on the foot  EXAM: LEFT FOOT - 2 VIEW  COMPARISON:  None.  FINDINGS: Dorsal soft tissue swelling and irregularity noted. There is  subjacent osteopenia of the base of the first metatarsal with coarsening of the trabecular markings involving the navicular bone most prominently. No radiopaque foreign body. No acute fracture or dislocation. Plantar calcaneal spurring.  IMPRESSION: Dorsal soft tissue swelling with subjacent irregular bony trabeculation as described above. This could indicate osteomyelitis.   Electronically Signed   By: Conchita Paris M.D.   On: 08/04/2015 17:25    EKG:   Orders placed or performed during the hospital encounter of 08/03/15  . EKG 12-Lead  . EKG 12-Lead  . EKG    ASSESSMENT AND PLAN:  * Left foot cellulitis and gangrene with sepsis Appreciate podiatry help. On IV abx. Bcx GRAM POSITIVE COCCI IN PAIRS IN CHAINS Waiting for ID to see patient.  * Hyponatremia/hypokalemia Improved. Hold daily Kcl.  * DM 2 Sliding scale insulin with diabetic diet.  *chronic afib. Rate Controlled. On sotalol. ASA  * h/o diabetic neuropathy;on Neurontin  * Moderate Malnutrition due to acute illness;continue mighty shakes BID.  All the records are reviewed and case discussed with Care Management/Social Workerr. Management plans discussed with the patient, family and they are in agreement.  CODE STATUS: full  TOTAL TIME TAKING CARE OF THIS PATIENT:35 minutes.   POSSIBLE D/C IN 6-7 DAYS, DEPENDING ON CLINICAL CONDITION.   Hillary Bow R M.D on 08/05/2015 at 12:34 PM  Between 7am to 6pm - Pager - [phone]  After 6pm go to [email or web address] - password [email or web address] [place] [facility]  Office  [phone]  CC: Primary care physician; [name], DO

## 2015-08-05 NOTE — Progress Notes (Signed)
ANTIBIOTIC CONSULT NOTE - INITIAL  Pharmacy Consult for vancomycin Indication: cellulitis  Allergies  Allergen Reactions  . Codeine Nausea And Vomiting  . Tape Rash    Patient Measurements: Height: 5\' 2"  (157.5 cm) Weight: 138 lb 11.2 oz (62.914 kg) IBW/kg (Calculated) : 50.1 Adjusted Body Weight: 55 kg  Vital Signs: Temp: 102.4 F (39.1 C) (08/31 1810) Temp Source: Oral (08/31 1810) BP: 132/63 mmHg (08/31 1810) Pulse Rate: 96 (08/31 1810) Intake/Output from previous day: 08/30 0701 - 08/31 0700 In: 2519.2 [P.O.:530; I.V.:1689.2; IV Piggyback:300] Out: 850 [Urine:850] Intake/Output from this shift: Total I/O In: -  Out: 350 [Urine:350]  Labs:  Recent Labs  08/03/15 1716 08/04/15 0432 08/05/15 0451 08/05/15 0943  WBC 32.8* 23.7* 21.3*  --   HGB 11.0* 9.6* 8.5*  --   PLT 409 342 317  --   CREATININE 0.89 0.63  --  1.12*   Estimated Creatinine Clearance: 43.6 mL/min (by C-G formula based on Cr of 1.12).  Recent Labs  08/05/15 2038  [facility] 16     Microbiology: Recent Results (from the past 720 hour(s))  Culture, blood (routine x 2)     Status: None (Preliminary result)   Collection Time: 08/03/15  5:12 PM  Result Value Ref Range Status   Specimen Description BLOOD RIGHT ARM  Final   Special Requests BOTTLES DRAWN AEROBIC AND ANAEROBIC [place]  Final   Culture NO GROWTH 2 DAYS  Final   Report Status PENDING  Incomplete  Culture, blood (routine x 2)     Status: None (Preliminary result)   Collection Time: 08/03/15  5:15 PM  Result Value Ref Range Status   Specimen Description BLOOD LEFT ARM  Final   Special Requests BOTTLES DRAWN AEROBIC AND ANAEROBIC 4CC  Final   Culture  Setup Time   Final    GRAM POSITIVE COCCI IN PAIRS IN CHAINS AEROBIC BOTTLE ONLY CRITICAL RESULT CALLED TO, READ BACK BY AND VERIFIED WITH: MICHAEL JACOBS 08/04/15 1320 JGF    Culture   Final    VIRIDANS STREPTOCOCCUS AEROBIC BOTTLE ONLY Results consistent with contamination.     Report Status PENDING  Incomplete  Urine culture     Status: None   Collection Time: 08/03/15  6:10 PM  Result Value Ref Range Status   Specimen Description URINE, RANDOM  Final   Special Requests NONE  Final   Culture MULTIPLE SPECIES PRESENT, SUGGEST RECOLLECTION  Final   Report Status 08/05/2015 FINAL  Final  Wound culture     Status: None (Preliminary result)   Collection Time: 08/04/15  6:00 PM  Result Value Ref Range Status   Specimen Description FOOT  Final   Special Requests Immunocompromised  Final   Gram Stain PENDING  Incomplete   Culture NO GROWTH < 12 HOURS  Final   Report Status PENDING  Incomplete    Medical History: Past Medical History  Diagnosis Date  . Sleep apnea     cpap  . Dysrhythmia     a fib  . Hypertension   . Neuropathy   . History of hiatal hernia   . Anemia   . Arthritis   . Diabetes mellitus without complication   . HOH (hard of hearing)     aids  . A-fib     Medications:  Infusions:  . sodium chloride 50 mL/hr at 08/05/15 1412   Assessment: 63 yof cc left lower extremity swelling/redness/blister formation, PMH diabetes, neuropathy with LLE pain/warmth/blister since Saturday increasing in size. Clinical  cellulitis, blood cultures pending.   Vd 44 L, Ke 0.05 hr-1, T1/2 13.8 hours, predicted trough 16 mcg/mL  Goal of Therapy:  Vancomycin trough level 10-15 mcg/ml  Plan:  Expected duration 7 days with resolution of temperature and/or normalization of WBC. Vancomycin 1 gm IV Q18H ordered with stacked dosing (second dose approximately 9 hours after first), will check trough before fourth dose and adjust as needed to maintain trough 10 to 20 mcg/mL.  8/31:   Vancomycin trough @ 20:30 = 16 mcg/mL Will continue this pt on current dose of Vancomycin 1 gm IV Q18H.   Jazmynn Pho D, Pharm.D. Clinical Pharmacist 08/05/2015,9:51 PM

## 2015-08-05 NOTE — Op Note (Signed)
[facility] Percutaneous Study/Intervention Procedural Note   Date of Surgery: 08/03/2015 - 08/05/2015  Surgeon(s):Corliss Lamartina   Assistants:none  Pre-operative Diagnosis: PAD with gangrene LLE PAD with ulceration RLE Cardiac arrythmias  Post-operative diagnosis: Same  Procedure(s) Performed: 1. Ultrasound guidance for vascular access right femoral artery 2. Catheter placement into left peroneal, PT, and AT arteries from right femoral approach 3. Aortogram and selective left lower extremity angiogram 4. Percutaneous transluminal angioplasty of the left PT artery with 2.5 mm diameter angioplasty balloon 5. Percutaneous transluminal angioplasty of the left peroneal artery with 2.5 mm diameter angioplasty balloon  6.  Percutaneous transluminal angioplasty of the left AT artery with 3 mm diameter angioplasty balloon  7.  Percutaneous transluminal angioplasty of the left SFA with 5 mm diameter drug coated angioplasty balloon 8. StarClose closure device right femoral artery  EBL: 25 cc  Indications: Patient is a 64 yo WF with ulceration and cellulitis with gangrenous changes to the left foot over the past week or 2. She has had atrial fibrillation making this concerning for embolic disease but also has multiple atherosclerotic risk factors and suspected peripheral arterial disease. She also has a nonhealing ulceration between the toes on her right foot although this is not actively infected. She has blood cultures which were positive and signs of sepsis. The patient is brought in for angiography for further evaluation and potential treatment. Risks and benefits are discussed and informed consent is obtained  Procedure: The patient was identified and appropriate procedural time out was performed. The patient was then placed supine on the table and prepped and draped in the usual  sterile fashion. Ultrasound was used to evaluate the right common femoral artery. It was patent . A digital ultrasound image was acquired. A Seldinger needle was used to access the right common femoral artery under direct ultrasound guidance and a permanent image was performed. A 0.035 J wire was advanced without resistance and a 5Fr sheath was placed. Pigtail catheter was placed into the aorta and an AP aortogram was performed. This demonstrated normal renal arteries and normal aorta and iliac segments without significant stenosis. I then crossed the aortic bifurcation and advanced to the left femoral head. Selective left lower extremity angiogram was then performed. This demonstrated near occlusive stenosis in the mid to distal superficial femoral artery which made opacification of the tibial vessels reasonably poor. The patient was systemically heparinized and a 6 [place] Ansell sheath was then placed over the [employer] wire. I then used a Kumpe catheter and the advantage wire to navigate through the SFA stenosis and confirm intraluminal flow in the popliteal artery. I then perform selective imaging of the tibial vessels and all 3 vessels were found to have significant disease. With a catheter in the proximal anterior tibial artery, a 70-75% stenosis was seen in the anterior tibial artery about 6-8 cm beyond its origin. The anterior tibial artery then occluded at the ankle without a distal reconstitution. Her lesion was on the top of the foot, so this was not surprising. I then cannulated the tibioperoneal trunk and advanced into the peroneal artery which demonstrated a tapered stenosis in the mid to distal portion of the artery that was in the 70-80% range with continuous flow distally and multiple collaterals at the ankle into the other vessels into the foot. With a catheter in the proximal posterior tibial artery, this was found to have a 90% or greater stenosis over the first 10-12 cm of the  vessel with  what appeared to be pretty good flow distally. I then elected to treat all 3 tibial vessels as well as the SFA lesion. I crossed the posterior tibial artery stenosis first and confirm intraluminal flow in the distal posterior tibial artery. A V 18 wire was then placed into the foot and I treated the proximal and mid posterior tibial artery with a 2.5 mm diameter by 22 cm length angioplasty balloon inflated to 8 atm for 1 minute. Initially there was significant spasm in the vessel, but later good flow was seen in the posterior tibial artery with 20-30% residual stenosis proximally which was not flow limiting. I then turned my attention to the peroneal artery. The Kumpe catheter and V 18 wire were used to navigate into the peroneal artery and crossed the mid to distal stenosis. I treated the mid to distal peroneal artery with the 2.5 mm diameter by 22 cm length angioplasty balloon inflated to 8 atm for 1 minute. The balloon was deflated and completion imaging showed about a 30% residual stenosis with good flow beyond this area and no flow limitation. I then cannulated the anterior tibial artery and crossed the proximal stenosis without difficulty with the V 18 wire and a Kumpe catheter confirming intraluminal flow in the anterior tibial artery below the lesion. I then replaced the wire and treated this lesion with a 3 mm diameter by 8 cm length angioplasty balloon inflated to 10 atm for 1 minute. Completion angiogram showed a less than 20% residual stenosis in this location. I then turned my attention to the SFA lesion. We used a 5 mm diameter by 15 cm length Lutonix drug-coated angioplasty balloon inflated to 8 atm for 1 minute. On balloon deflation, the residual stenosis appeared to be in the 20-25% range with a nonflow limiting dissection seen in the mid to distal SFA. At this point, I felt we had improved her lower extremity perfusion to the left leg as much as possible. I elected to terminate the  procedure. The sheath was removed and StarClose closure device was deployed in the left femoral artery with excellent hemostatic result. The patient was taken to the recovery room in stable condition having tolerated the procedure well.  Findings:  Aortogram: No aortoiliac stenosis, renal arteries appeared widely patent Left Lower Extremity: Diffuse multilevel disease improved with intervention.   Disposition: Patient was taken to the recovery room in stable condition having tolerated the procedure well.  Complications: None  Trig Mcbryar 08/05/2015 4:08 PM

## 2015-08-05 NOTE — Consult Note (Signed)
[facility] Vascular Consult Note  MRN : [id number]  [name] is a 64 y.o. (Jul 27, 1951) female who presents with chief complaint of  Chief Complaint  Patient presents with  . Blood Infection  .  History of Present Illness: Patient presents with about a one-week history of pain and discoloration feet, left worse than right. She developed a large blister and redness of her left foot that she first noticed last Thursday or Friday. This was very painful. Prior to that, she had some pain in her legs with walking but this was not severe. Over several days, was noted that the pain and skin changes were getting worse. She was also noted to have some ulcers between her toes on the right these were not grossly infected. Her foot and leg became weaker and more numb and she came to the hospital for evaluation 2 days ago. She has long-standing diabetes mellitus and some neuropathy, and it is possible that her foot had been somewhat problematic prior to her noticing it last week. She was found to have cardiac arrhythmias and atrial fibrillation. She was seen by podiatry who was extremely concerned about the appearance of her feet, particularly the left. It was noticed that she did not have good pulses and she had poor capillary refill and we will consult for further evaluation and treatment. She has also has bacteremia and systemic fever with a MAXIMUM TEMPERATURE of greater than 102 with the foot felt to be the source. Her blood cultures on admission were positive. Her right foot pain is reasonably mild, but her left foot pain is severe, 10 out of 10, and unrelenting. nothing has really helped this.  Current Facility-Administered Medications  Medication Dose Route Frequency Provider Last Rate Last Dose  . 0.9 %  sodium chloride infusion   Intravenous Continuous Algernon Huxley, MD      . 0.9 %  sodium chloride infusion   Intravenous Continuous Hillary Bow, MD 50 mL/hr at 08/05/15 1412     . acetaminophen (TYLENOL) tablet 650 mg  650 mg Oral Q6H PRN Henreitta Leber, MD   650 mg at 08/04/15 1543   Or  . acetaminophen (TYLENOL) suppository 650 mg  650 mg Rectal Q6H PRN Henreitta Leber, MD      . acetaminophen (TYLENOL) tablet 1,000 mg  1,000 mg Oral Q6H PRN Henreitta Leber, MD      . aspirin EC tablet 81 mg  81 mg Oral BID Henreitta Leber, MD   81 mg at 08/05/15 1117  . atorvastatin (LIPITOR) tablet 40 mg  40 mg Oral QHS Henreitta Leber, MD   40 mg at 08/04/15 2114  . Chlorhexidine Gluconate Cloth 2 % PADS 6 each  6 each Topical Once Algernon Huxley, MD      . enoxaparin (LOVENOX) injection 40 mg  40 mg Subcutaneous Q24H Srikar Sudini, MD      . gabapentin (NEURONTIN) capsule 100 mg  100 mg Oral TID Henreitta Leber, MD   100 mg at 08/05/15 1117  . glimepiride (AMARYL) tablet 2 mg  2 mg Oral Q breakfast Henreitta Leber, MD   2 mg at 08/04/15 0944  . ibuprofen (ADVIL,MOTRIN) tablet 400 mg  400 mg Oral Q6H PRN Henreitta Leber, MD   400 mg at 08/04/15 2127  . insulin aspart (novoLOG) injection 0-15 Units  0-15 Units Subcutaneous TID WC Epifanio Lesches, MD   3 Units at 08/05/15 0802  .  insulin aspart (novoLOG) injection 0-5 Units  0-5 Units Subcutaneous QHS Henreitta Leber, MD   3 Units at 08/03/15 2334  . insulin glargine (LANTUS) injection 13 Units  13 Units Subcutaneous QHS Epifanio Lesches, MD   13 Units at 08/04/15 2114  . omega-3 acid ethyl esters (LOVAZA) capsule 1 g  1 g Oral Daily Henreitta Leber, MD   1 g at 08/05/15 1117  . ondansetron (ZOFRAN) tablet 4 mg  4 mg Oral Q6H PRN Henreitta Leber, MD       Or  . ondansetron [facility]) injection 4 mg  4 mg Intravenous Q6H PRN Henreitta Leber, MD      . pyridOXINE (VITAMIN B-6) tablet 100 mg  100 mg Oral QHS Henreitta Leber, MD   100 mg at 08/04/15 2114  . sotalol (BETAPACE) tablet 80 mg  80 mg Oral BID Henreitta Leber, MD   80 mg at 08/05/15 1116  . vancomycin (VANCOCIN) IVPB 1000 mg/200 mL premix  1,000 mg Intravenous Q18H Henreitta Leber, MD   1,000 mg at 08/05/15 0223  . vitamin E capsule 400 Units  400 Units Oral QHS Henreitta Leber, MD   400 Units at 08/04/15 2114  . zolpidem (AMBIEN) tablet 5 mg  5 mg Oral QHS PRN Henreitta Leber, MD        Past Medical History  Diagnosis Date  . Sleep apnea     cpap  . Dysrhythmia     a fib  . Hypertension   . Neuropathy   . History of hiatal hernia   . Anemia   . Arthritis   . Diabetes mellitus without complication   . HOH (hard of hearing)     aids  . A-fib     Past Surgical History  Procedure Laterality Date  . Abdominal hysterectomy    . Oophorectomy    . Cardioversion    . Endovenous ablation saphenous vein w/ laser    . Cataract extraction w/phaco Left 07/16/2015    Procedure: CATARACT EXTRACTION PHACO AND INTRAOCULAR LENS PLACEMENT (IOC);  Surgeon: Lyla Glassing, MD;  Location: [facility] ORS;  Service: Ophthalmology;  Laterality: Left;  [place]: 01:16.4 AP%: 12.7 CDE: 9.68 Fluid lot# [id number]    Social History Social History  Substance Use Topics  . Smoking status: Former Smoker -- 1.00 packs/day for 40 years    Types: Cigarettes  . Smokeless tobacco: None  . Alcohol Use: 0.6 oz/week    1 Glasses of wine per week     Comment: occas  No IV drug use  Family History Family History  Problem Relation Age of Onset  . Diabetes Mother   . COPD Mother   . Heart disease Mother   . Diabetes Father   . Heart disease Father   no bleeding disorders or clotting disorders  Allergies  Allergen Reactions  . Codeine Nausea And Vomiting  . Tape Rash     REVIEW OF SYSTEMS (Negative unless checked)  Constitutional: [] Weight loss  [] Fever  [] Chills Cardiac: [] Chest pain   [] Chest pressure   [x] Palpitations   [] Shortness of breath when laying flat   [] Shortness of breath at rest   [] Shortness of breath with exertion. Vascular:  [] Pain in legs with walking   [] Pain in legs at rest   [] Pain in legs when laying flat   [] Claudication   [x] Pain in feet when walking   [x] Pain in feet at rest  [x] Pain in feet when laying flat   []   History of DVT   [] Phlebitis   [] Swelling in legs   [] Varicose veins   [] Non-healing ulcers Pulmonary:   [] Uses home oxygen   [] Productive cough   [] Hemoptysis   [] Wheeze  [] COPD   [] Asthma Neurologic:  [] Dizziness  [] Blackouts   [] Seizures   [] History of stroke   [] History of TIA  [] Aphasia   [] Temporary blindness   [] Dysphagia   [] Weakness or numbness in arms   [] Weakness or numbness in legs Musculoskeletal:  [] Arthritis   [] Joint swelling   [] Joint pain   [] Low back pain Hematologic:  [] Easy bruising  [] Easy bleeding   [] Hypercoagulable state   [] Anemic  [] Hepatitis Gastrointestinal:  [] Blood in stool   [] Vomiting blood  [] Gastroesophageal reflux/heartburn   [] Difficulty swallowing. Genitourinary:  [] Chronic kidney disease   [] Difficult urination  [] Frequent urination  [] Burning with urination   [] Blood in urine Skin:  [] Rashes   [x] Ulcers   [x] Wounds Psychological:  [x] History of anxiety   []  History of major depression.  Physical Examination  Filed Vitals:   08/04/15 1516 08/04/15 1700 08/05/15 0033 08/05/15 1028  BP: 133/51  101/44 112/59  Pulse: 93  57 75  Temp: 102.7 F (39.3 C) 99.5 F (37.5 C) 99.6 F (37.6 C) 98.7 F (37.1 C)  TempSrc: Oral Oral Oral Oral  Resp: 18  17   Height:      Weight:      SpO2: 97%  98% 100%   Body mass index is 25.36 kg/(m^2). Gen:  WD/WN, NAD Head: [place]/AT, No temporalis wasting. Prominent temp pulse not noted. Ear/Nose/Throat: Hearing grossly intact, nares w/o erythema or drainage, oropharynx w/o Erythema/Exudate Eyes: PERRLA, EOMI.  Neck: Supple, no nuchal rigidity.  No bruit or JVD.  Pulmonary:  Good air movement, clear to auscultation bilaterally.  Cardiac: irregularly irregular Vascular:  Vessel Right Left  Radial Palpable Palpable  Ulnar Palpable Palpable  Brachial Palpable Palpable  Carotid Palpable, without bruit Palpable, without bruit  Aorta Not palpable N/A  Femoral  Palpable Palpable  Popliteal Not Palpable Not Palpable  PT Not Palpable Not Palpable  DP Not Palpable Not Palpable   Gastrointestinal: soft, non-tender/non-distended. No guarding/reflex. No masses, surgical incisions, or scars. Musculoskeletal: weakness in left foot.  Extremities with ischemic changes and poor capillary refill.  No deformity or atrophy. Mild LE edema Neurologic: CN 2-12 intact..  Symmetrical.  Speech is fluent. Motor exam as listed above. Psychiatric: Judgment intact, Mood & affect appropriate for pt's clinical situation. Dermatologic: ulcer between toes on right foot.  Large blister and cellulitis present on top of left foot.   Lymph : No Cervical, Axillary, or Inguinal lymphadenopathy.       CBC Lab Results  Component Value Date   WBC 21.3* 08/05/2015   HGB 8.5* 08/05/2015   HCT 26.8* 08/05/2015   MCV 79.9* 08/05/2015   PLT 317 08/05/2015    BMET    Component Value Date/Time   NA 130* 08/05/2015 0943   K 5.3* 08/05/2015 0943   CL 101 08/05/2015 0943   CO2 23 08/05/2015 0943   GLUCOSE 173* 08/05/2015 0943   BUN 13 08/05/2015 0943   CREATININE 1.12* 08/05/2015 0943   CALCIUM 8.1* 08/05/2015 0943   GFRNONAA 50* 08/05/2015 0943   GFRAA 58* 08/05/2015 0943   Estimated Creatinine Clearance: 43.6 mL/min (by C-G formula based on Cr of 1.12).  COAG No results found for: INR, PROTIME  Radiology Dg Chest 2 View  08/03/2015   CLINICAL DATA:  Sepsis, lower extremity infection, diabetes  EXAM: CHEST  2 VIEW  COMPARISON:  10/31/2009  FINDINGS: Mild cardiomegaly again noted without evidence for edema. No focal pulmonary opacity. No pleural effusion. Mild pectus excavatum deformity. Bones are subjectively osteopenic. No acute osseous abnormality.  IMPRESSION: Mild cardiomegaly without focal acute finding.   Electronically Signed   By: Conchita Paris M.D.   On: 08/03/2015 18:10   Dg Foot 2 Views Left  08/04/2015   CLINICAL DATA:  Cellulitis, nonhealing wound on  the foot  EXAM: LEFT FOOT - 2 VIEW  COMPARISON:  None.  FINDINGS: Dorsal soft tissue swelling and irregularity noted. There is subjacent osteopenia of the base of the first metatarsal with coarsening of the trabecular markings involving the navicular bone most prominently. No radiopaque foreign body. No acute fracture or dislocation. Plantar calcaneal spurring.  IMPRESSION: Dorsal soft tissue swelling with subjacent irregular bony trabeculation as described above. This could indicate osteomyelitis.   Electronically Signed   By: Conchita Paris M.D.   On: 08/04/2015 17:25      Assessment/Plan 1. Left foot infection and cellulitis: febrile and had positive blood cultures. Foot looks quite poor and perfusion appears markedly reduced. 2. Ischemia LLE:  Likely led to ulceration and infection.  No pedal pulses appreciated.  Left foot has diminished motor function.  Recent arrythmia makes embolic disease possible and PAD likely present.  LLE angiogram recommended, and will plan to doing this afternoon.  Risks and benefits discussed and patient agreeable.  Risk of limb loss is high 3. RLE ulceration:  Likely some malperfusion to right foot as well although not as profound.  May have to address in near future. Left foot much worse, so will address today 4. A. Fib.  May have created an embolic source that led to ischemia.  Consider anticoagulation long term  DEW,JASON, MD  08/05/2015 2:41 PM

## 2015-08-05 NOTE — Progress Notes (Signed)
Spoke with Dr. Lucky Cowboy and was given orders to give morning medications, but hold lunch insulin.

## 2015-08-05 NOTE — Progress Notes (Signed)
Spoke with patient, her daughter, and son-in-law about diabetes and home regimen for diabetes control. Patient reports that she is followed by her PCP for diabetes management and she was prescribed Amaryl 2 mg BID and Metformin 500 mg BID as an outpatient for diabetes control. Patient states that she was just started on the Amaryl and the Metformin about one week ago. Patient reports that she stopped taking the Metformin due to excessive diarrhea (reports having diarrhea about every 2 hours). She states that she was feeling so bad that she stopped taking the Amaryl too.   Inquired about knowledge about A1C when she went to her PCP about 1 week ago.  Patient states that she does not know what her A1C was but her glucose was about 300 mg/dl.  Discussed A1C results (11.9% on 08/03/15) and explained that her current A1C would equate to an average glucose of about 300 mg/dl. Reviewed what an A1C is, A1C goal, basic pathophysiology of DM Type 2, basic home care, importance of checking CBGs and maintaining good CBG control to prevent long-term and short-term complications. Discussed impact of nutrition, exercise, stress, sickness, and medications on diabetes control.  Discussed how hyperglycemia causes damage to inner lining of blood vessels. Patient reports that she began checking her glucose about 1 week ago when she went to her PCP and since then it has been over 300 mg/dl when she has checked it at home. Inquired about patient's thoughts on taking insulin as an outpatient. Patient states that she took Novolog insulin (via pen) in the past and she took herself off of it. When inquired about why she stopped taking insulin patient stated "I was just hard headed."  Patient states that she is agreeable to taking insulin as an outpatient if needed and she would like case management to check and see what her co-pays for Lantus and Novolog would be for both insulin pens and vials. If co-pay is too high, patient is interested in  using more affordable insulin such as Novolin R, Novolin 70/30, or Novolin NPH which can be purchased at [facility] for $25 per vial.  Talked with patient about the importance of taking medications as prescribed to improve glycemic control and decrease risk of complications of uncontrolled diabetes. Stressed importance of glycemic control to improve healing. Asked that if patient is discharged on insulin that she check her glucose 3-4 times per day and that she keep a log of glucose readings which she will take with her to the doctor visits. Explained how glucose logs can help doctor use the information for DM medication adjustments.  Patient verbalized understanding of information discussed and she states that she has no further questions at this time related to diabetes.   Due to current A1C, inpatient glycemic control, and foot ulcers, MD may want to consider discharging patient on insulin. Talked with Liliane Channel, [place] and he will check and see what patient's co-pay would be for Lantus and Novolog in both vial and pens. If co-pays are too high, doctor may want to consider discharging patient on NOVOLIN R, NOVOLIN 70/30, or NOVOLIN N which can all be purchased at [facility] for $25 per vial.   Thanks, Barnie Alderman, RN, MSN, CCRN, CDE Diabetes Coordinator Inpatient Diabetes Program [phone] (Team Pager) [phone] ([facility] office) [phone] [facility] office) [phone] [facility] office)

## 2015-08-05 NOTE — Progress Notes (Signed)
Spoke with Dr. Lucky Cowboy may reinstate previous diet order, okay to give patient lovenox shot.

## 2015-08-06 ENCOUNTER — Other Ambulatory Visit: Payer: Medicare Other

## 2015-08-06 ENCOUNTER — Ambulatory Visit: Payer: Medicare Other

## 2015-08-06 LAB — CBC WITH DIFFERENTIAL/PLATELET
BASOS PCT: 0 %
Basophils Absolute: 0.1 10*3/uL (ref 0–0.1)
Eosinophils Absolute: 0 10*3/uL (ref 0–0.7)
Eosinophils Relative: 0 %
HEMATOCRIT: 27.2 % — AB (ref 35.0–47.0)
Hemoglobin: 8.8 g/dL — ABNORMAL LOW (ref 12.0–16.0)
Lymphocytes Relative: 20 %
Lymphs Abs: 4.3 10*3/uL — ABNORMAL HIGH (ref 1.0–3.6)
MCH: 25.8 pg — ABNORMAL LOW (ref 26.0–34.0)
MCHC: 32.2 g/dL (ref 32.0–36.0)
MCV: 80.2 fL (ref 80.0–100.0)
MONO ABS: 1.6 10*3/uL — AB (ref 0.2–0.9)
MONOS PCT: 7 %
NEUTROS ABS: 15.9 10*3/uL — AB (ref 1.4–6.5)
Neutrophils Relative %: 73 %
Platelets: 367 10*3/uL (ref 150–440)
RBC: 3.39 MIL/uL — ABNORMAL LOW (ref 3.80–5.20)
RDW: 14 % (ref 11.5–14.5)
WBC: 21.9 10*3/uL — ABNORMAL HIGH (ref 3.6–11.0)

## 2015-08-06 LAB — BASIC METABOLIC PANEL
ANION GAP: 9 (ref 5–15)
BUN: 14 mg/dL (ref 6–20)
CALCIUM: 7.6 mg/dL — AB (ref 8.9–10.3)
CO2: 19 mmol/L — AB (ref 22–32)
CREATININE: 1.09 mg/dL — AB (ref 0.44–1.00)
Chloride: 104 mmol/L (ref 101–111)
GFR calc Af Amer: 60 mL/min — ABNORMAL LOW (ref >60)
GFR calc non Af Amer: 52 mL/min — ABNORMAL LOW (ref >60)
GLUCOSE: 177 mg/dL — AB (ref 65–99)
Potassium: 4.2 mmol/L (ref 3.5–5.1)
Sodium: 132 mmol/L — ABNORMAL LOW (ref 135–145)

## 2015-08-06 LAB — GLUCOSE, CAPILLARY
GLUCOSE-CAPILLARY: 142 mg/dL — AB (ref 65–99)
GLUCOSE-CAPILLARY: 174 mg/dL — AB (ref 65–99)
Glucose-Capillary: 170 mg/dL — ABNORMAL HIGH (ref 65–99)
Glucose-Capillary: 105 mg/dL — ABNORMAL HIGH (ref 65–99)

## 2015-08-06 MED ORDER — INSULIN GLARGINE 100 UNIT/ML SOLOSTAR PEN: 13.0000 [IU] | PEN_INJECTOR | Freq: Every day | SUBCUTANEOUS | Status: DC

## 2015-08-06 MED ORDER — GLUCERNA SHAKE PO LIQD
237.0000 mL | Freq: Three times a day (TID) | ORAL | Status: DC
  Administered 2015-08-06 – 2015-08-08 (×7): 237 mL via ORAL

## 2015-08-06 MED ORDER — INSULIN ASPART 100 UNIT/ML FLEXPEN: 4.0000 [IU] | PEN_INJECTOR | Freq: Three times a day (TID) | SUBCUTANEOUS | Status: DC

## 2015-08-06 NOTE — Progress Notes (Signed)
Patient Demographics  [name], is a 64 y.o. female   MRN: [id number]   DOB - 1951/11/03  Admit Date - 08/03/2015    Outpatient Primary MD for the patient is [name], DO  Subjective: Patient had a vascular reconstitution in the left lower extremity yesterday by Dr. Lucky Cowboy. She states she is fairly comfortable and did not run a fever today. Does not complain of any pain in the left or the right foot.   With History of -  Past Medical History  Diagnosis Date  . Sleep apnea     cpap  . Dysrhythmia     a fib  . Hypertension   . Neuropathy   . History of hiatal hernia   . Anemia   . Arthritis   . Diabetes mellitus without complication   . HOH (hard of hearing)     aids  . A-fib       Past Surgical History  Procedure Laterality Date  . Abdominal hysterectomy    . Oophorectomy    . Cardioversion    . Endovenous ablation saphenous vein w/ laser    . Cataract extraction w/phaco Left 07/16/2015    Procedure: CATARACT EXTRACTION PHACO AND INTRAOCULAR LENS PLACEMENT (IOC);  Surgeon: Lyla Glassing, MD;  Location: [facility] ORS;  Service: Ophthalmology;  Laterality: Left;  [place]: 01:16.4 AP%: 12.7 CDE: 9.68 Fluid lot# [id number]    in for   Chief Complaint  Patient presents with  . Blood Infection   also gangrenous changes to the left foot. Patient also has an ulceration full-thickness to the intertriginous region between the third and fourth toes of the right foot HPI  [name]  is a 64 y.o. female, she is diabetic and noticed a blister formation on her left foot was hospitalized with redness and cellulitis. On my exam the patient had a large swath of necrotic tissue on the dorsum of her left foot with a superficial necrotic blister above this region. Cellulitis was noted throughout the lower leg. Right  foot has a 1 cm wound in the third and fourth intertriginous zone between the toes. There is no redness or purulence to this region. Patient with nonpalpable pulses to both feet.    Review of Systems    In addition to the HPI above,  No Fever-chills, No Headache, No changes with Vision or hearing, No problems swallowing food or Liquids, No Chest pain, Cough or Shortness of Breath, No Abdominal pain, No Nausea or Vommitting, Bowel movements are regular, No Blood in stool or Urine, No dysuria, No new skin rashes or bruises, No new joints pains-aches,  No new weakness, tingling, numbness in any extremity, No recent weight gain or loss, No polyuria, polydypsia or polyphagia, No significant Mental Stressors.  A full 10 point Review of Systems was done, except as stated above, all other Review of Systems were negative.   Social History Social History  Substance Use Topics  . Smoking status: Former Smoker -- 1.00 packs/day for 40 years  Types: Cigarettes  . Smokeless tobacco: Not on file  . Alcohol Use: 0.6 oz/week    1 Glasses of wine per week     Comment: occas     Family History Family History  Problem Relation Age of Onset  . Diabetes Mother   . COPD Mother   . Heart disease Mother   . Diabetes Father   . Heart disease Father      Prior to Admission medications   Medication Sig Start Date End Date Taking? Authorizing Provider  acetaminophen (TYLENOL) 500 MG tablet Take 1,000 mg by mouth every 6 (six) hours as needed for mild pain or headache.   Yes Historical Provider, MD  aspirin EC 81 MG tablet Take 81 mg by mouth 2 (two) times daily.   Yes Historical Provider, MD  atorvastatin (LIPITOR) 40 MG tablet Take 40 mg by mouth at bedtime.   Yes Historical Provider, MD  diphenhydrAMINE (BENADRYL) 25 MG tablet Take 25 mg by mouth at bedtime as needed for sleep.   Yes Historical Provider, MD  gabapentin (NEURONTIN) 100 MG capsule Take 100 mg by mouth 3 (three) times daily.     Yes Historical Provider, MD  glimepiride (AMARYL) 2 MG tablet Take 2 mg by mouth daily with breakfast.   Yes Historical Provider, MD  ibuprofen (ADVIL,MOTRIN) 200 MG tablet Take 400 mg by mouth every 6 (six) hours as needed for headache or mild pain.   Yes Historical Provider, MD  metFORMIN (GLUCOPHAGE) 500 MG tablet Take 500 mg by mouth 2 (two) times daily with a meal.   Yes Historical Provider, MD  Omega-3 Fatty Acids (FISH OIL) 1200 MG CAPS Take 1,200 mg by mouth 2 (two) times daily.   Yes Historical Provider, MD  pyridOXINE (VITAMIN B-6) 100 MG tablet Take 100 mg by mouth at bedtime.    Yes Historical Provider, MD  sotalol (BETAPACE) 80 MG tablet Take 80 mg by mouth 2 (two) times daily.    Yes Historical Provider, MD  vitamin E 400 UNIT capsule Take 400 Units by mouth at bedtime.    Yes Historical Provider, MD  insulin aspart (NOVOLOG FLEXPEN) 100 UNIT/ML FlexPen Inject 4 Units into the skin 3 (three) times daily with meals. 08/06/15   Srikar Sudini, MD  Insulin Glargine (LANTUS SOLOSTAR) 100 UNIT/ML Solostar Pen Inject 13 Units into the skin daily at 10 pm. 08/06/15   Hillary Bow, MD    Anti-infectives    Start     Dose/Rate Route Frequency Ordered Stop   08/04/15 0900  vancomycin (VANCOCIN) IVPB 1000 mg/200 mL premix     1,000 mg 200 mL/hr over 60 Minutes Intravenous Every 18 hours 08/03/15 2325     08/03/15 2330  vancomycin (VANCOCIN) IVPB 1000 mg/200 mL premix     1,000 mg 200 mL/hr over 60 Minutes Intravenous  Once 08/03/15 2325 08/04/15 0150   08/03/15 1915  piperacillin-tazobactam (ZOSYN) IVPB 3.375 g     3.375 g 12.5 mL/hr over 240 Minutes Intravenous  Once 08/03/15 1902 08/03/15 2315      Scheduled Meds: . aspirin EC  81 mg Oral BID  . atorvastatin  40 mg Oral QHS  . enoxaparin (LOVENOX) injection  40 mg Subcutaneous Q24H  . feeding supplement (GLUCERNA SHAKE)  237 mL Oral TID WC  . gabapentin  100 mg Oral TID  . glimepiride  2 mg Oral Q breakfast  . insulin aspart   0-15 Units Subcutaneous TID WC  . insulin aspart  0-5 Units  Subcutaneous QHS  . insulin glargine  13 Units Subcutaneous QHS  . omega-3 acid ethyl esters  1 g Oral Daily  . pyridOXINE  100 mg Oral QHS  . sotalol  80 mg Oral BID  . vancomycin  1,000 mg Intravenous Q18H  . vitamin E  400 Units Oral QHS   Continuous Infusions:  PRN Meds:.acetaminophen **OR** acetaminophen, acetaminophen, ibuprofen, ondansetron **OR** ondansetron (ZOFRAN) IV, zolpidem  Allergies  Allergen Reactions  . Codeine Nausea And Vomiting  . Tape Rash    Physical Exam  Vitals  Blood pressure 133/61, pulse 101, temperature 98.6 F (37 C), temperature source Oral, resp. rate 18, height 5\' 2"  (1.575 m), weight 62.914 kg (138 lb 11.2 oz), SpO2 98 %.  Lower Extremity exam:  Vascular: Pulses are absent bilaterally. Left lower leg and vascular reconstruction yesterday. Toes today are still severely cyanotic with maybe a little increased pink coloration to the plantar aspects of the digits. Cellulitis is improved somewhat to the left lower extremity and is more distally located at this time but still about the third of the way of lower leg.  Dermatological: Large necrotic area on the dorsum left foot approximately 7 x 6 cm in diameter. Runs from the medial talonavicular joint to the dorsal and dorsal lateral tarsal -metatarsal joints. Intertriginous area between this third and fourth toes of the right foot show an ulceration full thickness but no purulence or cellulitis located here.  Neurological: Upper light touch sensations are absent bilaterally    Data Review  CBC  Recent Labs Lab 08/03/15 1716 08/04/15 0432 08/05/15 0451 08/06/15 0356  WBC 32.8* 23.7* 21.3* 21.9*  HGB 11.0* 9.6* 8.5* 8.8*  HCT 34.3* 29.0* 26.8* 27.2*  PLT 409 342 317 367  MCV 81.0 79.8* 79.9* 80.2  MCH 26.1 26.4 25.4* 25.8*  MCHC 32.2 33.0 31.7* 32.2  RDW 13.5 13.5 13.6 14.0  LYMPHSABS 5.2*  --   --  4.3*  MONOABS 1.7*  --   --   1.6*  EOSABS 0.0  --   --  0.0  BASOSABS 0.0  --   --  0.1   ------------------------------------------------------------------------------------------------------------------  Chemistries   Recent Labs Lab 08/03/15 1716 08/04/15 0432 08/05/15 0943 08/06/15 0356  NA 125* 131* 130* 132*  K 3.1* 2.9* 5.3* 4.2  CL 87* 98* 101 104  CO2 20* 23 23 19*  GLUCOSE 327* 356* 173* 177*  BUN 12 11 13 14   CREATININE 0.89 0.63 1.12* 1.09*  CALCIUM 8.3* 7.7* 8.1* 7.6*  AST 29  --   --   --   ALT 25  --   --   --   ALKPHOS 138*  --   --   --   BILITOT 1.5*  --   --   --    ------------------------------------------------------------------------------------------------------------------ estimated creatinine clearance is 44.2 mL/min (by C-G formula based on Cr of 1.09). ------------------------------------------------------------------------------------------------------------------ No results for input(s): TSH, T4TOTAL, T3FREE, THYROIDAB in the last 72 hours.  Invalid input(s): FREET3   Coagulation profile No results for input(s): INR, PROTIME in the last 168 hours. ------------------------------------------------------------------------------------------------------------------- No results for input(s): DDIMER in the last 72 hours. -------------------------------------------------------------------------------------------------------------------  Cardiac Enzymes No results for input(s): CKMB, TROPONINI, MYOGLOBIN in the last 168 hours.  Invalid input(s): CK ------------------------------------------------------------------------------------------------------------------ Invalid input(s): POCBNP   ---------------------------------------------------------------------------------------------------------------  Urinalysis    Component Value Date/Time   COLORURINE AMBER* 08/03/2015 1810   APPEARANCEUR CLOUDY* 08/03/2015 1810   LABSPEC 1.016 08/03/2015 1810   PHURINE 5.0 08/03/2015  1810  GLUCOSEU >500* 08/03/2015 1810   HGBUR NEGATIVE 08/03/2015 1810   BILIRUBINUR NEGATIVE 08/03/2015 1810   KETONESUR 1+* 08/03/2015 1810   PROTEINUR 100* 08/03/2015 1810   NITRITE NEGATIVE 08/03/2015 1810   LEUKOCYTESUR TRACE* 08/03/2015 1810     Imaging results:   Dg Foot 2 Views Left  08/04/2015   CLINICAL DATA:  Cellulitis, nonhealing wound on the foot  EXAM: LEFT FOOT - 2 VIEW  COMPARISON:  None.  FINDINGS: Dorsal soft tissue swelling and irregularity noted. There is subjacent osteopenia of the base of the first metatarsal with coarsening of the trabecular markings involving the navicular bone most prominently. No radiopaque foreign body. No acute fracture or dislocation. Plantar calcaneal spurring.  IMPRESSION: Dorsal soft tissue swelling with subjacent irregular bony trabeculation as described above. This could indicate osteomyelitis.   Electronically Signed   By: Conchita Paris M.D.   On: 08/04/2015 17:25        Assessment & Plan: Patient still has severe ischemia vascular changes to the left foot with significant swath of skin and soft tissue and possibly bone necrosis to the dorsal aspect of the foot. Toes are still notably cyanotic and ischemic at this juncture but slight improvement from yesterday. Right foot ulceration appears stable at this point but is still full thickness. Plan: I spent approximately 20-30 minutes discussing with the family today different scenarios on how this left foot could play out. The best case scenario would be a good demarcation with healthier pinker tissue surrounding the region. This might enable [place] to debride all the dead tissue away and use of wound VAC grafting to get healing to the region. I explained to them the chances of this were slammed. Second option would be the possibility of developing healthier tissue to the point we could resect the dead tissue and create a plantar flap to close in the area but this would probably involved  disarticulation of the bones and joints at the midtarsal area in order be able to create a flap to cover the region. The third case scenario may be the ultimate best case an area which would be a below-knee amputation. This be much more predictable as far as healing and I think she would function well. Medicine areas may involve multiple procedures and a very long convalescence. I explained all this to them today. I spoke with Dr. Lucky Cowboy today and we both feel like it'll be a good idea to allow these areas to demarcate a bit and see how well the tissues respond to the revascularization. Meanwhile she should stay on IV antibiotics until her white count comes down. He currently is still at 61. I'll follow her tomorrow.  Active Problems:   Cellulitis   Malnutrition of moderate degree     Family Communication: Plan discussed with patient and her family     Perry Mount M.D on 08/06/2015 at 4:04 PM  Thank you for the consult, we will follow the patient with you in the Hospital.

## 2015-08-06 NOTE — Progress Notes (Signed)
[facility]  Daily Progress Note   Subjective  - 1 Day Post-Op  Feeling better Pain in feet minimal today Afebrile overnight.  Objective Filed Vitals:   08/05/15 1810 08/06/15 0018 08/06/15 0733 08/06/15 1622  BP: 132/63 116/73 133/61 133/65  Pulse: 96 103 101 72  Temp: 102.4 F (39.1 C) 98.3 F (36.8 C) 98.6 F (37 C) 98.5 F (36.9 C)  TempSrc: Oral Oral Oral Oral  Resp: 18 16 18 18   Height:      Weight:      SpO2: 96% 97% 98% 100%    Intake/Output Summary (Last 24 hours) at 08/06/15 1656 Last data filed at 08/06/15 1130  Gross per 24 hour  Intake 1234.17 ml  Output   1550 ml  Net -315.83 ml    PULM  CTAB CV  RRR VASC  Pulses not palpable, but foot appears a little less cyanotic on the left.  Fixed skin changes present on left foot and there is marginal tissue around this.  Right foot ulcerations stable.  Laboratory CBC    Component Value Date/Time   WBC 21.9* 08/06/2015 0356   HGB 8.8* 08/06/2015 0356   HCT 27.2* 08/06/2015 0356   PLT 367 08/06/2015 0356    BMET    Component Value Date/Time   NA 132* 08/06/2015 0356   K 4.2 08/06/2015 0356   CL 104 08/06/2015 0356   CO2 19* 08/06/2015 0356   GLUCOSE 177* 08/06/2015 0356   BUN 14 08/06/2015 0356   CREATININE 1.09* 08/06/2015 0356   CALCIUM 7.6* 08/06/2015 0356   GFRNONAA 52* 08/06/2015 0356   GFRAA 60* 08/06/2015 0356    Assessment/Planning: POD #1 s/p extensive percutaneous revascularization of the LLE   Agree with Dr. Selina Cooley assessment.  Perfusion has been optimized, but she clearly has a significant amount of tissue on the left foot that is non-viable.  I do not know if she has enough tissue to have a weight bearing foot, but would allow time to see the tissue demarcate to determine this further  Also has ulceration of the right foot.  Will likely need angiogram of the right leg as well, but would like to defer this until after the left leg is settled as long as it  is stable  See me in office in 2-3 weeks with ABIs    DEW,JASON  08/06/2015, 4:56 PM

## 2015-08-06 NOTE — Care Management Important Message (Signed)
Important Message  Patient Details  Name: [name] MRN: [id number] Date of Birth: [birth year]   Medicare Important Message Given:  Yes-second notification given    [name] 08/06/2015, 1:39 PM

## 2015-08-06 NOTE — Progress Notes (Signed)
[place] [facility] [place] NAME: [name]    MR#:  [id number]  DATE OF BIRTH:  [birth year]  SUBJECTIVE:  CHIEF COMPLAINT:   Chief Complaint  Patient presents with  . Blood Infection   Today patient still has pain left foot. Afebrile now.   REVIEW OF SYSTEMS:   ROS CONSTITUTIONAL: No fever, fatigue or weakness.  EYES: No blurred or double vision.  EARS, NOSE, AND THROAT: No tinnitus or ear pain.  RESPIRATORY: No cough, shortness of breath, wheezing or hemoptysis.  CARDIOVASCULAR: No chest pain, orthopnea, edema.  GASTROINTESTINAL: No nausea, vomiting, diarrhea or abdominal pain.  GENITOURINARY: No dysuria, hematuria.  ENDOCRINE: No polyuria, nocturia,  HEMATOLOGY: No anemia, easy bruising or bleeding SKIN: Left foot redness, ulcer MUSCULOSKELETAL: left  Leg cellulitis/left foot dorsum has black discolaration,appears like dry gangrene   NEUROLOGIC: h/o neuropathy  PSYCHIATRY: No anxiety or depression.   DRUG ALLERGIES:   Allergies  Allergen Reactions  . Codeine Nausea And Vomiting  . Tape Rash    VITALS:  Blood pressure 133/61, pulse 101, temperature 98.6 F (37 C), temperature source Oral, resp. rate 18, height 5\' 2"  (1.575 m), weight 62.914 kg (138 lb 11.2 oz), SpO2 98 %.  PHYSICAL EXAMINATION:  GENERAL:  64 y.o.-year-old patient lying in the bed with no acute distress.  EYES: Pupils equal, round, reactive to light and accommodation. No scleral icterus. Extraocular muscles intact.  HEENT: Head atraumatic, normocephalic. Oropharynx and nasopharynx clear.  NECK:  Supple, no jugular venous distention. No thyroid enlargement, no tenderness.  LUNGS: Normal breath sounds bilaterally, no wheezing, rales,rhonchi or crepitation. No use of accessory muscles of respiration.  CARDIOVASCULAR: S1, S2 normal. No murmurs, rubs, or gallops.  ABDOMEN: Soft, nontender, nondistended. Bowel sounds present. No organomegaly or mass.   EXTREMITIES: left foot gangrene/left leg celulitis NEUROLOGIC: Cranial nerves II through XII are intact. Muscle strength 5/5 in all extremities. Sensation intact. Gait not checked.  PSYCHIATRIC: The patient is alert and oriented x 3.  SKIN: left foot gangrene   LABORATORY PANEL:   CBC  Recent Labs Lab 08/06/15 0356  WBC 21.9*  HGB 8.8*  HCT 27.2*  PLT 367   ------------------------------------------------------------------------------------------------------------------  Chemistries   Recent Labs Lab 08/03/15 1716  08/06/15 0356  NA 125*  < > 132*  K 3.1*  < > 4.2  CL 87*  < > 104  CO2 20*  < > 19*  GLUCOSE 327*  < > 177*  BUN 12  < > 14  CREATININE 0.89  < > 1.09*  CALCIUM 8.3*  < > 7.6*  AST 29  --   --   ALT 25  --   --   ALKPHOS 138*  --   --   BILITOT 1.5*  --   --   < > = values in this interval not displayed. ------------------------------------------------------------------------------------------------------------------  Cardiac Enzymes No results for input(s): TROPONINI in the last 168 hours. ------------------------------------------------------------------------------------------------------------------  RADIOLOGY:  Dg Foot 2 Views Left  08/04/2015   CLINICAL DATA:  Cellulitis, nonhealing wound on the foot  EXAM: LEFT FOOT - 2 VIEW  COMPARISON:  None.  FINDINGS: Dorsal soft tissue swelling and irregularity noted. There is subjacent osteopenia of the base of the first metatarsal with coarsening of the trabecular markings involving the navicular bone most prominently. No radiopaque foreign body. No acute fracture or dislocation. Plantar calcaneal spurring.  IMPRESSION: Dorsal soft tissue swelling with subjacent irregular bony trabeculation as described above. This  could indicate osteomyelitis.   Electronically Signed   By: Conchita Paris M.D.   On: 08/04/2015 17:25    EKG:   Orders placed or performed during the hospital encounter of 08/03/15  . EKG  12-Lead  . EKG 12-Lead  . EKG    ASSESSMENT AND PLAN:  * Left foot cellulitis and gangrene with sepsis Appreciate podiatry help. On IV abx. Strep viridans in blood culture. Consult to infectious disease and discussed case with Dr. Ola Spurr.  * Peripheral arterial disease Status post PCI for left lower extremity disease.  * Hyponatremia/hypokalemia Improved. Hold daily Kcl.  * DM 2 Sliding scale insulin with diabetic diet.  *chronic afib. Rate Controlled. On sotalol. ASA Patient will need follow-up with cardiology as outpatient for assessment on need for anticoagulation.  * Anemia of chronic disease  * h/o diabetic neuropathy;on Neurontin  * Moderate Malnutrition due to acute illness;continue mighty shakes BID.  All the records are reviewed and case discussed with Care Management/Social Workerr. Management plans discussed with the patient, family and they are in agreement.  CODE STATUS: full  TOTAL TIME TAKING CARE OF THIS PATIENT:35 minutes.   POSSIBLE D/C IN 3-4 DAYS, DEPENDING ON CLINICAL CONDITION.   Hillary Bow R M.D on 08/06/2015 at 12:08 PM  Between 7am to 6pm - Pager - [phone]  After 6pm go to [email or web address] - password [email or web address] [facility]  Office  [phone]  CC: Primary care physician; [name], DO

## 2015-08-06 NOTE — Progress Notes (Signed)
Nutrition Follow-up  DOCUMENTATION CODES:   Non-severe (moderate) malnutrition in context of acute illness/injury  INTERVENTION:  Meals and snacks: Cater to pt preferences.  Discussed various options regarding meals Nutrition Supplement Therapy: Pt does not want mightyshake as only in stock flavor of strawberry.  Recommend glucerna TID (vanilla) for additional kcals   NUTRITION DIAGNOSIS:   Inadequate oral intake related to acute illness as evidenced by per patient/family report.    GOAL:   Patient will meet greater than or equal to 90% of their needs  Not meeting nutritional needs  MONITOR:    (Energy intake)  REASON FOR ASSESSMENT:   Consult Poor PO  ASSESSMENT:      Current Nutrition: for lunch ate 1/4 cup green beans, few bites of fruit cup, 1/2 mashed potatoes and few bites of [place]. Had toast for breakfast. Overall intake continues to be poor during admission.  Pt reports food has been good just no appetite   Gastrointestinal Profile: Last BM: 9/01   Medications: reviewed  Electrolyte/Renal Profile and Glucose Profile:   Recent Labs Lab 08/04/15 0432 08/05/15 0943 08/06/15 0356  NA 131* 130* 132*  K 2.9* 5.3* 4.2  CL 98* 101 104  CO2 23 23 19*  BUN 11 13 14   CREATININE 0.63 1.12* 1.09*  CALCIUM 7.7* 8.1* 7.6*  GLUCOSE 356* 173* 177*   Protein Profile:  Recent Labs Lab 08/03/15 1716  ALBUMIN 2.6*       Weight Trend since Admission: Filed Weights   08/03/15 1701 08/03/15 2256  Weight: 139 lb (63.05 kg) 138 lb 11.2 oz (62.914 kg)      Diet Order:  Diet Carb Modified Fluid consistency:: Thin; Room service appropriate?: Yes  Skin:   reviewed    Height:   Ht Readings from Last 1 Encounters:  08/03/15 5\' 2"  (1.575 m)    Weight:   Wt Readings from Last 1 Encounters:  08/03/15 138 lb 11.2 oz (62.914 kg)      BMI:  Body mass index is 25.36 kg/(m^2).  Estimated Nutritional Needs:   Kcal:  BEE 1128 kcals (IF 1.1-1.3,  AF 1.3) 9211-9417 kcals/d.   Protein:  (1.0-1.2 g/kg) 63-76 g/d  Fluid:  (30-47ml/kg) 1890-2244ml/d  EDUCATION NEEDS:   No education needs identified at this time  [facility]. Zenia Resides, [place], [place] (pager)

## 2015-08-06 NOTE — Care Management (Signed)
Faxed Rx of Lantus and Novolog pens to [employer] for insurance verification. Spoke with Insurance risk surveyor.

## 2015-08-07 ENCOUNTER — Encounter: Payer: Self-pay | Admitting: Vascular Surgery

## 2015-08-07 ENCOUNTER — Ambulatory Visit: Payer: Commercial Managed Care - HMO | Admitting: Internal Medicine

## 2015-08-07 LAB — GLUCOSE, CAPILLARY
GLUCOSE-CAPILLARY: 85 mg/dL (ref 65–99)
GLUCOSE-CAPILLARY: 151 mg/dL — AB (ref 65–99)
Glucose-Capillary: 152 mg/dL — ABNORMAL HIGH (ref 65–99)
Glucose-Capillary: 199 mg/dL — ABNORMAL HIGH (ref 65–99)

## 2015-08-07 LAB — CBC WITH DIFFERENTIAL/PLATELET
BASOS PCT: 0 %
Basophils Absolute: 0.1 10*3/uL (ref 0–0.1)
EOS ABS: 0.1 10*3/uL (ref 0–0.7)
EOS PCT: 0 %
HCT: 26.9 % — ABNORMAL LOW (ref 35.0–47.0)
Hemoglobin: 8.7 g/dL — ABNORMAL LOW (ref 12.0–16.0)
LYMPHS ABS: 3.8 10*3/uL — AB (ref 1.0–3.6)
Lymphocytes Relative: 18 %
MCH: 26.3 pg (ref 26.0–34.0)
MCHC: 32.4 g/dL (ref 32.0–36.0)
MCV: 81.2 fL (ref 80.0–100.0)
MONOS PCT: 6 %
Monocytes Absolute: 1.3 10*3/uL — ABNORMAL HIGH (ref 0.2–0.9)
Neutro Abs: 15.6 10*3/uL — ABNORMAL HIGH (ref 1.4–6.5)
Neutrophils Relative %: 76 %
PLATELETS: 385 10*3/uL (ref 150–440)
RBC: 3.31 MIL/uL — ABNORMAL LOW (ref 3.80–5.20)
RDW: 14 % (ref 11.5–14.5)
WBC: 20.8 10*3/uL — AB (ref 3.6–11.0)

## 2015-08-07 LAB — CULTURE, BLOOD (ROUTINE X 2)

## 2015-08-07 LAB — C DIFFICILE QUICK SCREEN W PCR REFLEX
C Diff antigen: NEGATIVE
C Diff interpretation: NEGATIVE
C Diff toxin: NEGATIVE

## 2015-08-07 LAB — SEDIMENTATION RATE: SED RATE: 128 mm/h — AB (ref 0–30)

## 2015-08-07 LAB — C-REACTIVE PROTEIN: CRP: 19.6 mg/dL — AB (ref <1.0)

## 2015-08-07 NOTE — Progress Notes (Signed)
[facility] [place] NAME: [name]    MR#:  [id number]  DATE OF BIRTH:  [birth year]  SUBJECTIVE:  CHIEF COMPLAINT:   Chief Complaint  Patient presents with  . Blood Infection   Pain left foot improved. Afebrile now. Diarrhea, watery  REVIEW OF SYSTEMS:   ROS CONSTITUTIONAL: No fever, fatigue or weakness.  EYES: No blurred or double vision.  EARS, NOSE, AND THROAT: No tinnitus or ear pain.  RESPIRATORY: No cough, shortness of breath, wheezing or hemoptysis.  CARDIOVASCULAR: No chest pain, orthopnea, edema.  GASTROINTESTINAL: No nausea, vomiting, or abdominal pain. Diarrhea  GENITOURINARY: No dysuria, hematuria.  ENDOCRINE: No polyuria, nocturia,  HEMATOLOGY: No anemia, easy bruising or bleeding SKIN: Left foot redness, ulcer MUSCULOSKELETAL: left  Leg cellulitis NEUROLOGIC: h/o neuropathy  PSYCHIATRY: No anxiety or depression.   DRUG ALLERGIES:   Allergies  Allergen Reactions  . Codeine Nausea And Vomiting  . Tape Rash    VITALS:  Blood pressure 127/54, pulse 88, temperature 99.2 F (37.3 C), temperature source Oral, resp. rate 16, height 5\' 2"  (1.575 m), weight 62.914 kg (138 lb 11.2 oz), SpO2 99 %.  PHYSICAL EXAMINATION:  GENERAL:  64 y.o.-year-old patient lying in the bed with no acute distress.  EYES: Pupils equal, round, reactive to light and accommodation. No scleral icterus. Extraocular muscles intact.  HEENT: Head atraumatic, normocephalic. Oropharynx and nasopharynx clear.  NECK:  Supple, no jugular venous distention. No thyroid enlargement, no tenderness.  LUNGS: Normal breath sounds bilaterally, no wheezing, rales,rhonchi or crepitation. No use of accessory muscles of respiration.  CARDIOVASCULAR: S1, S2 normal. No murmurs, rubs, or gallops.  ABDOMEN: Soft, nontender, nondistended. Bowel sounds present. No organomegaly or mass.  EXTREMITIES: left foot gangrene/left leg celulitis NEUROLOGIC: Cranial  nerves II through XII are intact. Muscle strength 5/5 in all extremities. Sensation intact. Gait not checked.  PSYCHIATRIC: The patient is alert and oriented x 3.  SKIN: left foot gangrene   LABORATORY PANEL:   CBC  Recent Labs Lab 08/07/15 0436  WBC 20.8*  HGB 8.7*  HCT 26.9*  PLT 385   ------------------------------------------------------------------------------------------------------------------  Chemistries   Recent Labs Lab 08/03/15 1716  08/06/15 0356  NA 125*  < > 132*  K 3.1*  < > 4.2  CL 87*  < > 104  CO2 20*  < > 19*  GLUCOSE 327*  < > 177*  BUN 12  < > 14  CREATININE 0.89  < > 1.09*  CALCIUM 8.3*  < > 7.6*  AST 29  --   --   ALT 25  --   --   ALKPHOS 138*  --   --   BILITOT 1.5*  --   --   < > = values in this interval not displayed. ------------------------------------------------------------------------------------------------------------------  Cardiac Enzymes No results for input(s): TROPONINI in the last 168 hours. ------------------------------------------------------------------------------------------------------------------  RADIOLOGY:  No results found.  EKG:   Orders placed or performed during the hospital encounter of 08/03/15  . EKG 12-Lead  . EKG 12-Lead  . EKG    ASSESSMENT AND PLAN:  * Left foot cellulitis and gangrene with sepsis Appreciate podiatry help. On IV abx. Strep viridans in blood culture. Wound cx have GNR Consult to infectious disease D/C vancomycin and continue Zosyn. WBC still high  * Peripheral arterial disease Status post PCI for left lower extremity disease.  * Hyponatremia/hypokalemia Improved. Hold daily Kcl.  * DM 2 Sliding scale insulin with diabetic diet.  *  chronic afib. Rate Controlled. On sotalol. ASA Patient will need follow-up with cardiology as outpatient for assessment on need for anticoagulation.  * Anemia of chronic disease  * h/o diabetic neuropathy;on Neurontin  * Moderate  Malnutrition due to acute illness;continue mighty shakes BID.  All the records are reviewed and case discussed with Care Management/Social Workerr. Management plans discussed with the patient, family and they are in agreement.  CODE STATUS: full  TOTAL TIME TAKING CARE OF THIS PATIENT:35 minutes.   POSSIBLE D/C IN 3-4 DAYS, DEPENDING ON CLINICAL CONDITION.   Hillary Bow R M.D on 08/07/2015 at 11:19 AM  Between 7am to 6pm - Pager - [phone]  After 6pm go to [email or web address] - password [email or web address] [facility]  Office  [phone]  CC: Primary care physician; [name], DO

## 2015-08-07 NOTE — Progress Notes (Signed)
[place] INFECTIOUS DISEASE PROGRESS NOTE Date of Admission:  08/03/2015     ID: [name] is a 64 y.o. female with gangrene Active Problems:   Cellulitis   Malnutrition of moderate degree  Subjective: No fevers.   ROS  Eleven systems are reviewed and negative except per hpi  Medications:  Antibiotics Given (last 72 hours)    Date/Time Action Medication Dose Rate   08/05/15 0223 Given   vancomycin (VANCOCIN) IVPB 1000 mg/200 mL premix 1,000 mg 200 mL/hr   08/05/15 2111 Given   vancomycin (VANCOCIN) IVPB 1000 mg/200 mL premix 1,000 mg 200 mL/hr   08/06/15 1714 Given   vancomycin (VANCOCIN) IVPB 1000 mg/200 mL premix 1,000 mg 200 mL/hr   08/07/15 0939 Given   vancomycin (VANCOCIN) IVPB 1000 mg/200 mL premix 1,000 mg 200 mL/hr     . aspirin EC  81 mg Oral BID  . atorvastatin  40 mg Oral QHS  . enoxaparin (LOVENOX) injection  40 mg Subcutaneous Q24H  . feeding supplement (GLUCERNA SHAKE)  237 mL Oral TID WC  . gabapentin  100 mg Oral TID  . glimepiride  2 mg Oral Q breakfast  . insulin aspart  0-15 Units Subcutaneous TID WC  . insulin aspart  0-5 Units Subcutaneous QHS  . insulin glargine  13 Units Subcutaneous QHS  . omega-3 acid ethyl esters  1 g Oral Daily  . pyridOXINE  100 mg Oral QHS  . sotalol  80 mg Oral BID  . vitamin E  400 Units Oral QHS    Objective: Vital signs in last 24 hours: Temp:  [98.5 F (36.9 C)-100.1 F (37.8 C)] 99.2 F (37.3 C) (09/02 0723) Pulse Rate:  [72-99] 88 (09/02 0723) Resp:  [16-18] 16 (09/02 0723) BP: (114-133)/(47-65) 127/54 mmHg (09/02 0723) SpO2:  [99 %-100 %] 99 % (09/02 0723) Constitutional: oriented to person, place, and time. appears well-developed chronically ill appearing HENT: [place]/AT, PERRLA, no scleral icterus Mouth/Throat: Oropharynx is clear and moist. No oropharyngeal exudate.  Cardiovascular: Normal rate, regular rhythm and normal heart sounds. Pulmonary/Chest: Effort normal and breath sounds normal. No  respiratory distress. has no wheezes.  Neck = supple, no nuchal rigidity Abdominal: Soft. Bowel sounds are normal. exhibits no distension. There is no tenderness.  Lymphadenopathy: no cervical adenopathy. No axillary adenopathy Neurological: alert and oriented to person, place, and time.  Skin: L foot is cool distally but ankle and shin are warm and red - some less redness though Has large necrotic eschar dorsally. TTP  R foot with open drainage area dosally between 3-4 toes Psychiatric: a normal mood and affect. behavior is normal.   Lab Results  Recent Labs  08/05/15 0943 08/06/15 0356 08/07/15 0436  WBC  --  21.9* 20.8*  HGB  --  8.8* 8.7*  HCT  --  27.2* 26.9*  NA 130* 132*  --   K 5.3* 4.2  --   CL 101 104  --   CO2 23 19*  --   BUN 13 14  --   CREATININE 1.12* 1.09*  --     Microbiology: Results for orders placed or performed during the hospital encounter of 08/03/15  Culture, blood (routine x 2)     Status: None (Preliminary result)   Collection Time: 08/03/15  5:12 PM  Result Value Ref Range Status   Specimen Description BLOOD RIGHT ARM  Final   Special Requests BOTTLES DRAWN AEROBIC AND ANAEROBIC [place]  Final   Culture NO GROWTH 4  DAYS  Final   Report Status PENDING  Incomplete  Culture, blood (routine x 2)     Status: None   Collection Time: 08/03/15  5:15 PM  Result Value Ref Range Status   Specimen Description BLOOD LEFT ARM  Final   Special Requests BOTTLES DRAWN AEROBIC AND ANAEROBIC 4CC  Final   Culture  Setup Time   Final    GRAM POSITIVE COCCI IN PAIRS IN CHAINS AEROBIC BOTTLE ONLY CRITICAL RESULT CALLED TO, READ BACK BY AND VERIFIED WITH: MICHAEL JACOBS 08/04/15 1320 JGF    Culture   Final    GROUP B STREP(S.AGALACTIAE)ISOLATED STREPTOCOCCUS SANGUINIS AEROBIC BOTTLE ONLY Virtually 100% of S. agalactiae (Group B) strains are susceptible to Penicillin.  For Penicillin-allergic patients, Erythromycin (85-95% sensitive) and Clindamycin (80%  sensitive) are drugs of choice. Contact microbiology lab to request sensitivities if  needed within 7 days. ORG 2 SUGGESTIVE OF CONTAMINATION    Report Status 08/07/2015 FINAL  Final   Organism ID, Bacteria GROUP B STREP(S.AGALACTIAE)ISOLATED  Final      Susceptibility   Group b strep(s.agalactiae)isolated - MIC*    CLINDAMYCIN SENSITIVE Sensitive     CIPROFLOXACIN SENSITIVE Sensitive     VANCOMYCIN SENSITIVE Sensitive     ERYTHROMYCIN SENSITIVE Sensitive     * GROUP B STREP(S.AGALACTIAE)ISOLATED  Urine culture     Status: None   Collection Time: 08/03/15  6:10 PM  Result Value Ref Range Status   Specimen Description URINE, RANDOM  Final   Special Requests NONE  Final   Culture MULTIPLE SPECIES PRESENT, SUGGEST RECOLLECTION  Final   Report Status 08/05/2015 FINAL  Final  Wound culture     Status: None (Preliminary result)   Collection Time: 08/04/15  6:00 PM  Result Value Ref Range Status   Specimen Description FOOT  Final   Special Requests Immunocompromised  Final   Gram Stain FEW WBC SEEN MODERATE GRAM NEGATIVE RODS   Final   Culture NO GROWTH 2 DAYS  Final   Report Status PENDING  Incomplete  C difficile quick scan w PCR reflex     Status: None   Collection Time: 08/07/15 11:38 AM  Result Value Ref Range Status   C Diff antigen NEGATIVE NEGATIVE Final   C Diff toxin NEGATIVE NEGATIVE Final   C Diff interpretation Negative for C. difficile  Final    Studies/Results: No results found.  Assessment/Plan: [name] is a 64 y.o. female with very poorly controlled DM (A1C> 11), PN and PVD admitted with gangrenous changes of L foot and ulceration of R foot. She has had revascularization. She has had fevers and elevated wbc. Has been on IV abx with minimal improvement since angiogram. Bcx + for Grp B strep and viridans strep in 1/2 bcx. Wound cx pending.  Some improvement in her cellulitis but L toes still cool. Esr 19.6 and crp 128.  Recommendations Continue vanco  and zosyn.  Will likely benefit from several weeks of abx treatment while area demarcates and further operative planning can take place.  Would place picc and when stable for discharge abx selection will be based on cultures from wound.   Thank you very much for the consult. Will follow with you.  [place], [place]   08/07/2015, 4:08 PM

## 2015-08-07 NOTE — Progress Notes (Signed)
Patient Demographics  [name], is a 64 y.o. female   MRN: [id number]   DOB - 1951/01/25  Admit Date - 08/03/2015    Outpatient Primary MD for the patient is [name], DO  Consult requested in the Hospital by Hillary Bow, MD, On 08/07/2015       With History of -gangrenous changes to left foot.  2 days PO from angioplasty left LE  Past Medical History  Diagnosis Date  . Sleep apnea     cpap  . Dysrhythmia     a fib  . Hypertension   . Neuropathy   . History of hiatal hernia   . Anemia   . Arthritis   . Diabetes mellitus without complication   . HOH (hard of hearing)     aids  . A-fib       Past Surgical History  Procedure Laterality Date  . Abdominal hysterectomy    . Oophorectomy    . Cardioversion    . Endovenous ablation saphenous vein w/ laser    . Cataract extraction w/phaco Left 07/16/2015    Procedure: CATARACT EXTRACTION PHACO AND INTRAOCULAR LENS PLACEMENT (IOC);  Surgeon: Lyla Glassing, MD;  Location: [facility] ORS;  Service: Ophthalmology;  Laterality: Left;  [place]: 01:16.4 AP%: 12.7 CDE: 9.68 Fluid lot# [id number]  . Peripheral vascular catheterization Left 08/05/2015    Procedure: Lower Extremity Angiography;  Surgeon: Algernon Huxley, MD;  Location: [place];  Service: Cardiovascular;  Laterality: Left;  . Peripheral vascular catheterization  08/05/2015    Procedure: Lower Extremity Intervention;  Surgeon: Algernon Huxley, MD;  Location: [place] [facility];  Service: Cardiovascular;;    in for   Chief Complaint  Patient presents with  . Blood Infection     HPI  [name]  is a 64 y.o. female, admitted to the hospital earlier this week due to cellulitis to the left lower extremity. At that time, first consult was discovered she had a large swath of necrotic area on  the dorsum of the left foot along with severe ischemia to the digits. She is proximally 2 day status post angioplasty and stenting to the trifurcation area of the lower extremity on the left. She states she doesn't have much pain and she states she feels better overall now than previously.    Review of Systems    In addition to the HPI above,  No Fever-chills, No Headache, No changes with Vision or hearing, No problems swallowing food or Liquids, No Chest pain, Cough or Shortness of Breath, No Abdominal pain, No Nausea or Vommitting, Bowel movements are regular, No Blood in stool or Urine, No dysuria, No new skin rashes or bruises, No new joints pains-aches,  No new weakness, tingling, numbness in any extremity, No recent weight gain or loss, No polyuria, polydypsia or polyphagia, No significant Mental Stressors.  A full 10 point Review of Systems was done, except as stated above, all other Review of Systems were negative.   Social  History Social History  Substance Use Topics  . Smoking status: Former Smoker -- 1.00 packs/day for 40 years    Types: Cigarettes  . Smokeless tobacco: Not on file  . Alcohol Use: 0.6 oz/week    1 Glasses of wine per week     Comment: occas     Family History Family History  Problem Relation Age of Onset  . Diabetes Mother   . COPD Mother   . Heart disease Mother   . Diabetes Father   . Heart disease Father      Prior to Admission medications   Medication Sig Start Date End Date Taking? Authorizing Provider  acetaminophen (TYLENOL) 500 MG tablet Take 1,000 mg by mouth every 6 (six) hours as needed for mild pain or headache.   Yes Historical Provider, MD  aspirin EC 81 MG tablet Take 81 mg by mouth 2 (two) times daily.   Yes Historical Provider, MD  atorvastatin (LIPITOR) 40 MG tablet Take 40 mg by mouth at bedtime.   Yes Historical Provider, MD  diphenhydrAMINE (BENADRYL) 25 MG tablet Take 25 mg by mouth at bedtime as needed for sleep.    Yes Historical Provider, MD  gabapentin (NEURONTIN) 100 MG capsule Take 100 mg by mouth 3 (three) times daily.    Yes Historical Provider, MD  glimepiride (AMARYL) 2 MG tablet Take 2 mg by mouth daily with breakfast.   Yes Historical Provider, MD  ibuprofen (ADVIL,MOTRIN) 200 MG tablet Take 400 mg by mouth every 6 (six) hours as needed for headache or mild pain.   Yes Historical Provider, MD  metFORMIN (GLUCOPHAGE) 500 MG tablet Take 500 mg by mouth 2 (two) times daily with a meal.   Yes Historical Provider, MD  Omega-3 Fatty Acids (FISH OIL) 1200 MG CAPS Take 1,200 mg by mouth 2 (two) times daily.   Yes Historical Provider, MD  pyridOXINE (VITAMIN B-6) 100 MG tablet Take 100 mg by mouth at bedtime.    Yes Historical Provider, MD  sotalol (BETAPACE) 80 MG tablet Take 80 mg by mouth 2 (two) times daily.    Yes Historical Provider, MD  vitamin E 400 UNIT capsule Take 400 Units by mouth at bedtime.    Yes Historical Provider, MD  insulin aspart (NOVOLOG FLEXPEN) 100 UNIT/ML FlexPen Inject 4 Units into the skin 3 (three) times daily with meals. 08/06/15   Srikar Sudini, MD  Insulin Glargine (LANTUS SOLOSTAR) 100 UNIT/ML Solostar Pen Inject 13 Units into the skin daily at 10 pm. 08/06/15   Hillary Bow, MD    Anti-infectives    Start     Dose/Rate Route Frequency Ordered Stop   08/04/15 0900  vancomycin (VANCOCIN) IVPB 1000 mg/200 mL premix  Status:  Discontinued     1,000 mg 200 mL/hr over 60 Minutes Intravenous Every 18 hours 08/03/15 2325 08/07/15 1119   08/03/15 2330  vancomycin (VANCOCIN) IVPB 1000 mg/200 mL premix     1,000 mg 200 mL/hr over 60 Minutes Intravenous  Once 08/03/15 2325 08/04/15 0150   08/03/15 1915  piperacillin-tazobactam (ZOSYN) IVPB 3.375 g     3.375 g 12.5 mL/hr over 240 Minutes Intravenous  Once 08/03/15 1902 08/03/15 2315      Scheduled Meds: . aspirin EC  81 mg Oral BID  . atorvastatin  40 mg Oral QHS  . enoxaparin (LOVENOX) injection  40 mg Subcutaneous Q24H  .  feeding supplement (GLUCERNA SHAKE)  237 mL Oral TID WC  . gabapentin  100 mg Oral TID  .  glimepiride  2 mg Oral Q breakfast  . insulin aspart  0-15 Units Subcutaneous TID WC  . insulin aspart  0-5 Units Subcutaneous QHS  . insulin glargine  13 Units Subcutaneous QHS  . omega-3 acid ethyl esters  1 g Oral Daily  . pyridOXINE  100 mg Oral QHS  . sotalol  80 mg Oral BID  . vitamin E  400 Units Oral QHS   Continuous Infusions:  PRN Meds:.acetaminophen **OR** acetaminophen, acetaminophen, ibuprofen, ondansetron **OR** ondansetron (ZOFRAN) IV, zolpidem  Allergies  Allergen Reactions  . Codeine Nausea And Vomiting  . Tape Rash    Physical Exam  Vitals  Blood pressure 127/54, pulse 88, temperature 99.2 F (37.3 C), temperature source Oral, resp. rate 16, height 5\' 2"  (1.575 m), weight 62.914 kg (138 lb 11.2 oz), SpO2 99 %.  Lower Extremity exam:  Vascular: Nonpalpable pulses bilaterally. Digits on the left foot are still significantly discolored cyanotic and avascular. Dorsal aspect of the left foot still has a large swath of necrotic tissue of uncertain depth approximately 8 x 6 cm in size.  Dermatological: Neuropathic ulceration in between third and fourth toes appear stable with no redness or drainage from the region. Minimal progression as far as healing also. Pulses are also nonpalpable on the right foot.  Neurological: Significant peripheral neuropathy secondary to diabetes    Data Review  CBC  Recent Labs Lab 08/03/15 1716 08/04/15 0432 08/05/15 0451 08/06/15 0356 08/07/15 0436  WBC 32.8* 23.7* 21.3* 21.9* 20.8*  HGB 11.0* 9.6* 8.5* 8.8* 8.7*  HCT 34.3* 29.0* 26.8* 27.2* 26.9*  PLT 409 342 317 367 385  MCV 81.0 79.8* 79.9* 80.2 81.2  MCH 26.1 26.4 25.4* 25.8* 26.3  MCHC 32.2 33.0 31.7* 32.2 32.4  RDW 13.5 13.5 13.6 14.0 14.0  LYMPHSABS 5.2*  --   --  4.3* 3.8*  MONOABS 1.7*  --   --  1.6* 1.3*  EOSABS 0.0  --   --  0.0 0.1  BASOSABS 0.0  --   --  0.1 0.1    ------------------------------------------------------------------------------------------------------------------  Chemistries   Recent Labs Lab 08/03/15 1716 08/04/15 0432 08/05/15 0943 08/06/15 0356  NA 125* 131* 130* 132*  K 3.1* 2.9* 5.3* 4.2  CL 87* 98* 101 104  CO2 20* 23 23 19*  GLUCOSE 327* 356* 173* 177*  BUN 12 11 13 14   CREATININE 0.89 0.63 1.12* 1.09*  CALCIUM 8.3* 7.7* 8.1* 7.6*  AST 29  --   --   --   ALT 25  --   --   --   ALKPHOS 138*  --   --   --   BILITOT 1.5*  --   --   --    ------------------------------------------------------------------------------------------------------------------ estimated creatinine clearance is 44.2 mL/min (by C-G formula based on Cr of 1.09). ------------------------------------------------------------------------------------------------------------------ No results for input(s): TSH, T4TOTAL, T3FREE, THYROIDAB in the last 72 hours.  Invalid input(s): FREET3   Coagulation profile No results for input(s): INR, PROTIME in the last 168 hours. ------------------------------------------------------------------------------------------------------------------- No results for input(s): DDIMER in the last 72 hours. -------------------------------------------------------------------------------------------------------------------  Cardiac Enzymes No results for input(s): CKMB, TROPONINI, MYOGLOBIN in the last 168 hours.  Invalid input(s): CK ------------------------------------------------------------------------------------------------------------------ Invalid input(s): POCBNP   ---------------------------------------------------------------------------------------------------------------  Urinalysis    Component Value Date/Time   COLORURINE AMBER* 08/03/2015 1810   APPEARANCEUR CLOUDY* 08/03/2015 1810   LABSPEC 1.016 08/03/2015 1810   PHURINE 5.0 08/03/2015 1810   GLUCOSEU >500* 08/03/2015 1810   HGBUR NEGATIVE  08/03/2015 1810  [place] NEGATIVE 08/03/2015 1810   KETONESUR 1+* 08/03/2015 1810   PROTEINUR 100* 08/03/2015 1810   NITRITE NEGATIVE 08/03/2015 1810   LEUKOCYTESUR TRACE* 08/03/2015 1810     Assessment & Plan: Still very concerned regarding ischemic appearance to the digits on the left foot and lack of apparent reestablishment of blood flow to the foot itself. My hopes are minimal for limb salvage regarding this patient. I had a long discussion with her and her family yesterday. We'll continue to follow her and hope for signs of improved vascularity and demarcation of the necrotic tissue. She is also followed by Dr.Dew from [facility] and vein. [place]ress both feet today. My partner will follow her this weekend.  Active Problems:   Cellulitis   Malnutrition of moderate degree     Family Communication: Plan discussed with patient and **   Thank you for the consult, we will follow the patient with you in the Hospital.   Perry Mount M.D on 08/07/2015 at 12:22 PM  Thank you for the consult, we will follow the patient with you in the Hospital.

## 2015-08-08 LAB — BASIC METABOLIC PANEL
Anion gap: 4 — ABNORMAL LOW (ref 5–15)
BUN: 13 mg/dL (ref 6–20)
CALCIUM: 7.9 mg/dL — AB (ref 8.9–10.3)
CO2: 25 mmol/L (ref 22–32)
CREATININE: 1.21 mg/dL — AB (ref 0.44–1.00)
Chloride: 105 mmol/L (ref 101–111)
GFR calc non Af Amer: 46 mL/min — ABNORMAL LOW (ref >60)
GFR, EST AFRICAN AMERICAN: 53 mL/min — AB (ref >60)
Glucose, Bld: 92 mg/dL (ref 65–99)
Potassium: 3.8 mmol/L (ref 3.5–5.1)
SODIUM: 134 mmol/L — AB (ref 135–145)

## 2015-08-08 LAB — CBC WITH DIFFERENTIAL/PLATELET
BASOS PCT: 0 %
Basophils Absolute: 0.1 10*3/uL (ref 0–0.1)
EOS ABS: 0.1 10*3/uL (ref 0–0.7)
Eosinophils Relative: 1 %
HEMATOCRIT: 26.6 % — AB (ref 35.0–47.0)
Hemoglobin: 8.6 g/dL — ABNORMAL LOW (ref 12.0–16.0)
Lymphocytes Relative: 20 %
Lymphs Abs: 3.8 10*3/uL — ABNORMAL HIGH (ref 1.0–3.6)
MCH: 25.8 pg — ABNORMAL LOW (ref 26.0–34.0)
MCHC: 32.4 g/dL (ref 32.0–36.0)
MCV: 79.8 fL — ABNORMAL LOW (ref 80.0–100.0)
MONO ABS: 1.4 10*3/uL — AB (ref 0.2–0.9)
MONOS PCT: 7 %
Neutro Abs: 13.9 10*3/uL — ABNORMAL HIGH (ref 1.4–6.5)
Neutrophils Relative %: 72 %
Platelets: 400 10*3/uL (ref 150–440)
RBC: 3.34 MIL/uL — ABNORMAL LOW (ref 3.80–5.20)
RDW: 14 % (ref 11.5–14.5)
WBC: 19.3 10*3/uL — ABNORMAL HIGH (ref 3.6–11.0)

## 2015-08-08 LAB — CULTURE, BLOOD (ROUTINE X 2): CULTURE: NO GROWTH

## 2015-08-08 LAB — GLUCOSE, CAPILLARY
GLUCOSE-CAPILLARY: 180 mg/dL — AB (ref 65–99)
Glucose-Capillary: 96 mg/dL (ref 65–99)
Glucose-Capillary: 181 mg/dL — ABNORMAL HIGH (ref 65–99)
Glucose-Capillary: 197 mg/dL — ABNORMAL HIGH (ref 65–99)

## 2015-08-08 MED ORDER — PIPERACILLIN-TAZOBACTAM 3.375 G IVPB
3.3750 g | Freq: Three times a day (TID) | INTRAVENOUS | Status: DC
  Administered 2015-08-08 – 2015-08-13 (×16): 3.375 g via INTRAVENOUS
  Filled 2015-08-08 (×19): qty 50

## 2015-08-08 NOTE — Progress Notes (Signed)
3 Days Post-Op  Subjective: Patient seen. No specific complaints. States she is having a lot of drainage from her bandage.  Objective: Vital signs in last 24 hours: Temp:  [98.1 F (36.7 C)-98.8 F (37.1 C)] 98.1 F (36.7 C) (09/03 0746) Pulse Rate:  [62-91] 62 (09/03 0746) Resp:  [16-18] 16 (09/03 0746) BP: (104-131)/(60-71) 119/63 mmHg (09/03 0746) SpO2:  [98 %-100 %] 99 % (09/03 0746) Last BM Date: 08/08/15  Intake/Output from previous day: 09/02 0701 - 09/03 0700 In: 1040 [P.O.:840; IV Piggyback:200] Out: 1600 [Urine:1600] Intake/Output this shift: Total I/O In: 371 [P.O.:360; IV Piggyback:11] Out: 1100 [Urine:1100]  Significant drainage is noted on the bandaging on the left foot. Upon removal there is still the large necrotic area on the dorsum of the foot. Some medial and lateral blister formation with serous fluid. Erythema up to the level of the ankle. Cyanotic changes still present distally in the toes but there is capillary refill at the tips of the toes, none along the dorsum of the forefoot.  Lab Results:   Recent Labs  08/07/15 0436 08/08/15 0448  WBC 20.8* 19.3*  HGB 8.7* 8.6*  HCT 26.9* 26.6*  PLT 385 400   BMET  Recent Labs  08/06/15 0356 08/08/15 0448  NA 132* 134*  K 4.2 3.8  CL 104 105  CO2 19* 25  GLUCOSE 177* 92  BUN 14 13  CREATININE 1.09* 1.21*  CALCIUM 7.6* 7.9*   PT/INR No results for input(s): LABPROT, INR in the last 72 hours. ABG No results for input(s): PHART, HCO3 in the last 72 hours.  Invalid input(s): PCO2, PO2  Studies/Results: No results found.  Anti-infectives: Anti-infectives    Start     Dose/Rate Route Frequency Ordered Stop   08/08/15 1100  piperacillin-tazobactam (ZOSYN) IVPB 3.375 g     3.375 g 12.5 mL/hr over 240 Minutes Intravenous 3 times per day 08/08/15 1046     08/04/15 0900  vancomycin (VANCOCIN) IVPB 1000 mg/200 mL premix  Status:  Discontinued     1,000 mg 200 mL/hr over 60 Minutes Intravenous  Every 18 hours 08/03/15 2325 08/07/15 1119   08/03/15 2330  vancomycin (VANCOCIN) IVPB 1000 mg/200 mL premix     1,000 mg 200 mL/hr over 60 Minutes Intravenous  Once 08/03/15 2325 08/04/15 0150   08/03/15 1915  piperacillin-tazobactam (ZOSYN) IVPB 3.375 g     3.375 g 12.5 mL/hr over 240 Minutes Intravenous  Once 08/03/15 1902 08/03/15 2315      Assessment/Plan: s/p Procedure(s): Lower Extremity Angiography (Left) Lower Extremity Intervention Saline wet-to-dry dressing reapplied to the left foot. We will continue to follow the demarcation of circulation in the foot. Continue daily dressing changes and we will follow on Monday  LOS: 5 days    Rehan Holness W. 08/08/2015

## 2015-08-08 NOTE — Consult Note (Signed)
ANTIBIOTIC CONSULT NOTE - INITIAL  Pharmacy Consult for Zosyn Indication: cellulitis/ bacteremia  Allergies  Allergen Reactions  . Codeine Nausea And Vomiting  . Tape Rash    Patient Measurements: Height: 5\' 2"  (157.5 cm) Weight: 138 lb 11.2 oz (62.914 kg) IBW/kg (Calculated) : 50.1 Adjusted Body Weight:   Vital Signs: Temp: 98.1 F (36.7 C) (09/03 0746) Temp Source: Oral (09/03 0746) BP: 119/63 mmHg (09/03 0746) Pulse Rate: 62 (09/03 0746) Intake/Output from previous day: 09/02 0701 - 09/03 0700 In: 1040 [P.O.:840; IV Piggyback:200] Out: 1600 [Urine:1600] Intake/Output from this shift: Total I/O In: 0  Out: 1100 [Urine:1100]  Labs:  Recent Labs  08/06/15 0356 08/07/15 0436 08/08/15 0448  WBC 21.9* 20.8* 19.3*  HGB 8.8* 8.7* 8.6*  PLT 367 385 400  CREATININE 1.09*  --  1.21*   Estimated Creatinine Clearance: 39.9 mL/min (by C-G formula based on Cr of 1.21).  Recent Labs  08/05/15 2038  [facility] 16     Microbiology: Recent Results (from the past 720 hour(s))  Culture, blood (routine x 2)     Status: None   Collection Time: 08/03/15  5:12 PM  Result Value Ref Range Status   Specimen Description BLOOD RIGHT ARM  Final   Special Requests BOTTLES DRAWN AEROBIC AND ANAEROBIC [place]  Final   Culture NO GROWTH 5 DAYS  Final   Report Status 08/08/2015 FINAL  Final  Culture, blood (routine x 2)     Status: None   Collection Time: 08/03/15  5:15 PM  Result Value Ref Range Status   Specimen Description BLOOD LEFT ARM  Final   Special Requests BOTTLES DRAWN AEROBIC AND ANAEROBIC 4CC  Final   Culture  Setup Time   Final    GRAM POSITIVE COCCI IN PAIRS IN CHAINS AEROBIC BOTTLE ONLY CRITICAL RESULT CALLED TO, READ BACK BY AND VERIFIED WITH: MICHAEL JACOBS 08/04/15 1320 JGF    Culture   Final    GROUP B STREP(S.AGALACTIAE)ISOLATED STREPTOCOCCUS SANGUINIS AEROBIC BOTTLE ONLY Virtually 100% of S. agalactiae (Group B) strains are susceptible to Penicillin.  For  Penicillin-allergic patients, Erythromycin (85-95% sensitive) and Clindamycin (80% sensitive) are drugs of choice. Contact microbiology lab to request sensitivities if  needed within 7 days. ORG 2 SUGGESTIVE OF CONTAMINATION    Report Status 08/07/2015 FINAL  Final   Organism ID, Bacteria GROUP B STREP(S.AGALACTIAE)ISOLATED  Final      Susceptibility   Group b strep(s.agalactiae)isolated - MIC*    CLINDAMYCIN SENSITIVE Sensitive     CIPROFLOXACIN SENSITIVE Sensitive     VANCOMYCIN SENSITIVE Sensitive     ERYTHROMYCIN SENSITIVE Sensitive     * GROUP B STREP(S.AGALACTIAE)ISOLATED  Urine culture     Status: None   Collection Time: 08/03/15  6:10 PM  Result Value Ref Range Status   Specimen Description URINE, RANDOM  Final   Special Requests NONE  Final   Culture MULTIPLE SPECIES PRESENT, SUGGEST RECOLLECTION  Final   Report Status 08/05/2015 FINAL  Final  Wound culture     Status: None (Preliminary result)   Collection Time: 08/04/15  6:00 PM  Result Value Ref Range Status   Specimen Description FOOT  Final   Special Requests Immunocompromised  Final   Gram Stain FEW WBC SEEN MODERATE GRAM NEGATIVE RODS   Final   Culture NO GROWTH 2 DAYS  Final   Report Status PENDING  Incomplete  C difficile quick scan w PCR reflex     Status: None   Collection Time: 08/07/15 11:38  AM  Result Value Ref Range Status   C Diff antigen NEGATIVE NEGATIVE Final   C Diff toxin NEGATIVE NEGATIVE Final   C Diff interpretation Negative for C. difficile  Final    Medical History: Past Medical History  Diagnosis Date  . Sleep apnea     cpap  . Dysrhythmia     a fib  . Hypertension   . Neuropathy   . History of hiatal hernia   . Anemia   . Arthritis   . Diabetes mellitus without complication   . HOH (hard of hearing)     aids  . A-fib     Medications:  Scheduled:  . aspirin EC  81 mg Oral BID  . atorvastatin  40 mg Oral QHS  . enoxaparin (LOVENOX) injection  40 mg Subcutaneous Q24H  .  feeding supplement (GLUCERNA SHAKE)  237 mL Oral TID WC  . gabapentin  100 mg Oral TID  . glimepiride  2 mg Oral Q breakfast  . insulin aspart  0-15 Units Subcutaneous TID WC  . insulin aspart  0-5 Units Subcutaneous QHS  . insulin glargine  13 Units Subcutaneous QHS  . omega-3 acid ethyl esters  1 g Oral Daily  . pyridOXINE  100 mg Oral QHS  . sotalol  80 mg Oral BID  . vitamin E  400 Units Oral QHS   Assessment: Pt is a 64 year old female with cellulitis. Blood cx is positive for group B strep in 1/2 bc and wound culture mod gram neg rods. Pharmacy consulted to dose zosyn. Pt received one dose upon admission 8/29  Goal of Therapy:  resolution of infection  Plan:  zosyn 3.375g iv q 8 hours EI infusion  [name] D Petar Mucci 08/08/2015,10:36 AM

## 2015-08-08 NOTE — Progress Notes (Signed)
[place] at [place] NAME: [name]    MR#:  [id number]  DATE OF BIRTH:  18-Jun-1951  SUBJECTIVE:  CHIEF COMPLAINT:   Chief Complaint  Patient presents with  . Blood Infection   Pain left foot improved. Afebrile now. Diarrhea, watery  REVIEW OF SYSTEMS:   ROS CONSTITUTIONAL: No fever, fatigue or weakness.  EYES: No blurred or double vision.  EARS, NOSE, AND THROAT: No tinnitus or ear pain.  RESPIRATORY: No cough, shortness of breath, wheezing or hemoptysis.  CARDIOVASCULAR: No chest pain, orthopnea, edema.  GASTROINTESTINAL: No nausea, vomiting, or abdominal pain. Diarrhea  GENITOURINARY: No dysuria, hematuria.  ENDOCRINE: No polyuria, nocturia,  HEMATOLOGY: No anemia, easy bruising or bleeding SKIN: Left foot redness, ulcer MUSCULOSKELETAL: left  Leg cellulitis NEUROLOGIC: h/o neuropathy  PSYCHIATRY: No anxiety or depression.   DRUG ALLERGIES:   Allergies  Allergen Reactions  . Codeine Nausea And Vomiting  . Tape Rash    VITALS:  Blood pressure 119/63, pulse 62, temperature 98.1 F (36.7 C), temperature source Oral, resp. rate 16, height 5\' 2"  (1.575 m), weight 62.914 kg (138 lb 11.2 oz), SpO2 99 %.  PHYSICAL EXAMINATION:  GENERAL:  64 y.o.-year-old patient lying in the bed with no acute distress.  EYES: Pupils equal, round, reactive to light and accommodation. No scleral icterus. Extraocular muscles intact.  HEENT: Head atraumatic, normocephalic. Oropharynx and nasopharynx clear.  NECK:  Supple, no jugular venous distention. No thyroid enlargement, no tenderness.  LUNGS: Normal breath sounds bilaterally, no wheezing, rales,rhonchi or crepitation. No use of accessory muscles of respiration.  CARDIOVASCULAR: S1, S2 normal. No murmurs, rubs, or gallops.  ABDOMEN: Soft, nontender, nondistended. Bowel sounds present. No organomegaly or mass.  EXTREMITIES: left foot gangrene/left leg celulitis, toes cold to touch on  left side. NEUROLOGIC: Cranial nerves II through XII are intact. Muscle strength 5/5 in all extremities. Sensation intact. Gait not checked.  PSYCHIATRIC: The patient is alert and oriented x 3.  SKIN: left foot gangrene   LABORATORY PANEL:   CBC  Recent Labs Lab 08/08/15 0448  WBC 19.3*  HGB 8.6*  HCT 26.6*  PLT 400   ------------------------------------------------------------------------------------------------------------------  Chemistries   Recent Labs Lab 08/03/15 1716  08/08/15 0448  NA 125*  < > 134*  K 3.1*  < > 3.8  CL 87*  < > 105  CO2 20*  < > 25  GLUCOSE 327*  < > 92  BUN 12  < > 13  CREATININE 0.89  < > 1.21*  CALCIUM 8.3*  < > 7.9*  AST 29  --   --   ALT 25  --   --   ALKPHOS 138*  --   --   BILITOT 1.5*  --   --   < > = values in this interval not displayed. ------------------------------------------------------------------------------------------------------------------  Cardiac Enzymes No results for input(s): TROPONINI in the last 168 hours. ------------------------------------------------------------------------------------------------------------------  RADIOLOGY:  No results found.  EKG:   Orders placed or performed during the hospital encounter of 08/03/15  . EKG 12-Lead  . EKG 12-Lead  . EKG    ASSESSMENT AND PLAN:  * Left foot cellulitis and gangrene with sepsis Appreciate podiatry help. On IV abx. Strep agalactie in blood culture. Wound cx have GNR Appreciated Consult to infectious disease D/C vancomycin , she received only one dose of zosyn on admission- resume zosyn now. WBC still high- s;ow;y coming down.  * Peripheral arterial disease Status post PCI for  left lower extremity disease.  * Hyponatremia/hypokalemia Improved. Hold daily Kcl.  * DM 2 Sliding scale insulin with diabetic diet.  *chronic afib. Rate Controlled. On sotalol. ASA Patient will need follow-up with cardiology as outpatient for assessment on  need for anticoagulation.  * Anemia of chronic disease  * h/o diabetic neuropathy;on Neurontin  * Moderate Malnutrition due to acute illness;continue mighty shakes BID.  All the records are reviewed and case discussed with Care Management/Social Workerr. Management plans discussed with the patient, family and they are in agreement.  CODE STATUS: full  TOTAL TIME TAKING CARE OF THIS PATIENT:35 minutes.   POSSIBLE D/C IN 3-4 DAYS, DEPENDING ON CLINICAL CONDITION.   [name] M.D on 08/08/2015 at 10:18 AM  Between 7am to 6pm - Pager - [phone]  After 6pm go to [email or web address] - password [email or web address] [facility]  Office  [phone]  CC: Primary care physician; [name], DO

## 2015-08-09 ENCOUNTER — Inpatient Hospital Stay: Payer: Medicare Other

## 2015-08-09 LAB — WOUND CULTURE: Culture: NO GROWTH

## 2015-08-09 LAB — URINALYSIS COMPLETE WITH MICROSCOPIC (ARMC ONLY)
Bilirubin Urine: NEGATIVE
Glucose, UA: NEGATIVE mg/dL
Hgb urine dipstick: NEGATIVE
KETONES UR: NEGATIVE mg/dL
LEUKOCYTES UA: NEGATIVE
Nitrite: NEGATIVE
PH: 5 (ref 5.0–8.0)
PROTEIN: NEGATIVE mg/dL
Specific Gravity, Urine: 1.008 (ref 1.005–1.030)

## 2015-08-09 LAB — BASIC METABOLIC PANEL
Anion gap: 7 (ref 5–15)
BUN: 12 mg/dL (ref 6–20)
CO2: 25 mmol/L (ref 22–32)
Calcium: 8 mg/dL — ABNORMAL LOW (ref 8.9–10.3)
Chloride: 102 mmol/L (ref 101–111)
Creatinine, Ser: 1.22 mg/dL — ABNORMAL HIGH (ref 0.44–1.00)
GFR calc Af Amer: 52 mL/min — ABNORMAL LOW (ref >60)
GFR calc non Af Amer: 45 mL/min — ABNORMAL LOW (ref >60)
GLUCOSE: 94 mg/dL (ref 65–99)
POTASSIUM: 3.8 mmol/L (ref 3.5–5.1)
Sodium: 134 mmol/L — ABNORMAL LOW (ref 135–145)

## 2015-08-09 LAB — GLUCOSE, CAPILLARY
GLUCOSE-CAPILLARY: 126 mg/dL — AB (ref 65–99)
GLUCOSE-CAPILLARY: 121 mg/dL — AB (ref 65–99)
GLUCOSE-CAPILLARY: 62 mg/dL — AB (ref 65–99)
Glucose-Capillary: 70 mg/dL (ref 65–99)
Glucose-Capillary: 91 mg/dL (ref 65–99)

## 2015-08-09 LAB — CBC
HCT: 24.1 % — ABNORMAL LOW (ref 35.0–47.0)
HEMOGLOBIN: 8 g/dL — AB (ref 12.0–16.0)
MCH: 26.6 pg (ref 26.0–34.0)
MCHC: 33.2 g/dL (ref 32.0–36.0)
MCV: 80.1 fL (ref 80.0–100.0)
Platelets: 392 10*3/uL (ref 150–440)
RBC: 3 MIL/uL — AB (ref 3.80–5.20)
RDW: 13.8 % (ref 11.5–14.5)
WBC: 20.1 10*3/uL — ABNORMAL HIGH (ref 3.6–11.0)

## 2015-08-09 MED ORDER — VANCOMYCIN HCL IN DEXTROSE 1-5 GM/200ML-% IV SOLN
1000.0000 mg | INTRAVENOUS | Status: DC
  Administered 2015-08-09 – 2015-08-10 (×2): 1000 mg via INTRAVENOUS
  Filled 2015-08-09 (×3): qty 200

## 2015-08-09 MED ORDER — VANCOMYCIN HCL IN DEXTROSE 1-5 GM/200ML-% IV SOLN
1000.0000 mg | Freq: Once | INTRAVENOUS | Status: AC
  Administered 2015-08-09: 1000 mg via INTRAVENOUS
  Filled 2015-08-09: qty 200

## 2015-08-09 MED ORDER — IBUPROFEN 400 MG PO TABS
400.0000 mg | ORAL_TABLET | Freq: Four times a day (QID) | ORAL | Status: DC | PRN
  Administered 2015-08-09 – 2015-08-12 (×4): 400 mg via ORAL
  Filled 2015-08-09 (×4): qty 1

## 2015-08-09 NOTE — Progress Notes (Signed)
md notified of pt elevated temp 102.9

## 2015-08-09 NOTE — Progress Notes (Signed)
ANTIBIOTIC CONSULT NOTE - FOLLOW UP  Pharmacy Consult for Vancomycin Dosing  Allergies  Allergen Reactions  . Codeine Nausea And Vomiting  . Tape Rash    Patient Measurements: Height: 5\' 2"  (157.5 cm) Weight: 138 lb 11.2 oz (62.914 kg) IBW/kg (Calculated) : 50.1 Adjusted Body Weight: 58kg  Vital Signs: Temp: 98.5 F (36.9 C) (09/04 0903) Temp Source: Oral (09/04 0903) BP: 109/55 mmHg (09/04 0903) Pulse Rate: 66 (09/04 0903) Intake/Output from previous day: 09/03 0701 - 09/04 0700 In: 611 [P.O.:600; IV Piggyback:11] Out: 2625 [Urine:2625] Intake/Output from this shift: Total I/O In: 240 [P.O.:240] Out: 700 [Urine:700]  Labs:  Recent Labs  08/07/15 0436 08/08/15 0448 08/09/15 0506  WBC 20.8* 19.3* 20.1*  HGB 8.7* 8.6* 8.0*  PLT 385 400 392  CREATININE  --  1.21* 1.22*   Estimated Creatinine Clearance: 39.5 mL/min (by C-G formula based on Cr of 1.22). No results for input(s): VANCOTROUGH, VANCOPEAK, VANCORANDOM, GENTTROUGH, GENTPEAK, GENTRANDOM, TOBRATROUGH, TOBRAPEAK, TOBRARND, AMIKACINPEAK, AMIKACINTROU, AMIKACIN in the last 72 hours.   Microbiology: Recent Results (from the past 720 hour(s))  Culture, blood (routine x 2)     Status: None   Collection Time: 08/03/15  5:12 PM  Result Value Ref Range Status   Specimen Description BLOOD RIGHT ARM  Final   Special Requests BOTTLES DRAWN AEROBIC AND ANAEROBIC [place]  Final   Culture NO GROWTH 5 DAYS  Final   Report Status 08/08/2015 FINAL  Final  Culture, blood (routine x 2)     Status: None   Collection Time: 08/03/15  5:15 PM  Result Value Ref Range Status   Specimen Description BLOOD LEFT ARM  Final   Special Requests BOTTLES DRAWN AEROBIC AND ANAEROBIC 4CC  Final   Culture  Setup Time   Final    GRAM POSITIVE COCCI IN PAIRS IN CHAINS AEROBIC BOTTLE ONLY CRITICAL RESULT CALLED TO, READ BACK BY AND VERIFIED WITH: Jaimy Kliethermes JACOBS 08/04/15 1320 JGF    Culture   Final    GROUP B  STREP(S.AGALACTIAE)ISOLATED STREPTOCOCCUS SANGUINIS AEROBIC BOTTLE ONLY Virtually 100% of S. agalactiae (Group B) strains are susceptible to Penicillin.  For Penicillin-allergic patients, Erythromycin (85-95% sensitive) and Clindamycin (80% sensitive) are drugs of choice. Contact microbiology lab to request sensitivities if  needed within 7 days. ORG 2 SUGGESTIVE OF CONTAMINATION    Report Status 08/07/2015 FINAL  Final   Organism ID, Bacteria GROUP B STREP(S.AGALACTIAE)ISOLATED  Final      Susceptibility   Group b strep(s.agalactiae)isolated - MIC*    CLINDAMYCIN SENSITIVE Sensitive     CIPROFLOXACIN SENSITIVE Sensitive     VANCOMYCIN SENSITIVE Sensitive     ERYTHROMYCIN SENSITIVE Sensitive     * GROUP B STREP(S.AGALACTIAE)ISOLATED  Urine culture     Status: None   Collection Time: 08/03/15  6:10 PM  Result Value Ref Range Status   Specimen Description URINE, RANDOM  Final   Special Requests NONE  Final   Culture MULTIPLE SPECIES PRESENT, SUGGEST RECOLLECTION  Final   Report Status 08/05/2015 FINAL  Final  Wound culture     Status: None   Collection Time: 08/04/15  6:00 PM  Result Value Ref Range Status   Specimen Description FOOT  Final   Special Requests Immunocompromised  Final   Gram Stain FEW WBC SEEN MODERATE GRAM NEGATIVE RODS   Final   Culture NO GROWTH 4 DAYS  Final   Report Status 08/09/2015 FINAL  Final  C difficile quick scan w PCR reflex  Status: None   Collection Time: 08/07/15 11:38 AM  Result Value Ref Range Status   C Diff antigen NEGATIVE NEGATIVE Final   C Diff toxin NEGATIVE NEGATIVE Final   C Diff interpretation Negative for C. difficile  Final    Anti-infectives    Start     Dose/Rate Route Frequency Ordered Stop   08/09/15 2030  vancomycin (VANCOCIN) IVPB 1000 mg/200 mL premix     1,000 mg 200 mL/hr over 60 Minutes Intravenous Every 24 hours 08/09/15 1313     08/09/15 1315  vancomycin (VANCOCIN) IVPB 1000 mg/200 mL premix     1,000 mg 200  mL/hr over 60 Minutes Intravenous  Once 08/09/15 1313     08/08/15 1100  piperacillin-tazobactam (ZOSYN) IVPB 3.375 g     3.375 g 12.5 mL/hr over 240 Minutes Intravenous 3 times per day 08/08/15 1046     08/04/15 0900  vancomycin (VANCOCIN) IVPB 1000 mg/200 mL premix  Status:  Discontinued     1,000 mg 200 mL/hr over 60 Minutes Intravenous Every 18 hours 08/03/15 2325 08/07/15 1119   08/03/15 2330  vancomycin (VANCOCIN) IVPB 1000 mg/200 mL premix     1,000 mg 200 mL/hr over 60 Minutes Intravenous  Once 08/03/15 2325 08/04/15 0150   08/03/15 1915  piperacillin-tazobactam (ZOSYN) IVPB 3.375 g     3.375 g 12.5 mL/hr over 240 Minutes Intravenous  Once 08/03/15 1902 08/03/15 2315      Assessment: Pharmacy consulted to dose vancomycin for 64 yo female having fevers. Patient is currently on day 2 of Zosyn EI 3.375g IV Q8hr. Patient preiviously ordered vancomycin 1g IV q18hr with last dose on 9/2.    Goal of Therapy:  Vancomycin trough level 15-20 mcg/ml  Plan:  Will start patient on vancomycin 1g IV q24hr for goal trough of 15-20. Will order stacked dose to start at 2030. Will obtain trough prior to dose on 9/7.    Pharmacy will continue to monitor and adjust per consult.    Theophil Thivierge L 08/09/2015,1:46 PM

## 2015-08-09 NOTE — Progress Notes (Signed)
[name] at [place] NAME: [name]    MR#:  [id number]  DATE OF BIRTH:  [birth year]  SUBJECTIVE:  CHIEF COMPLAINT:   Chief Complaint  Patient presents with  . Blood Infection   Pain left foot improved.  Had fever , hypotension and tachycardia last night.  REVIEW OF SYSTEMS:   ROS CONSTITUTIONAL: positive for fever, fatigue or weakness.  EYES: No blurred or double vision.  EARS, NOSE, AND THROAT: No tinnitus or ear pain.  RESPIRATORY: No cough, shortness of breath, wheezing or hemoptysis.  CARDIOVASCULAR: No chest pain, orthopnea, edema.  GASTROINTESTINAL: No nausea, vomiting, or abdominal pain. Diarrhea  GENITOURINARY: No dysuria, hematuria.  ENDOCRINE: No polyuria, nocturia,  HEMATOLOGY: No anemia, easy bruising or bleeding SKIN: Left foot redness, ulcer MUSCULOSKELETAL: left  Leg cellulitis NEUROLOGIC: h/o neuropathy  PSYCHIATRY: No anxiety or depression.   DRUG ALLERGIES:   Allergies  Allergen Reactions  . Codeine Nausea And Vomiting  . Tape Rash    VITALS:  Blood pressure 109/55, pulse 66, temperature 98.5 F (36.9 C), temperature source Oral, resp. rate 18, height 5\' 2"  (1.575 m), weight 62.914 kg (138 lb 11.2 oz), SpO2 83 %.  PHYSICAL EXAMINATION:  GENERAL:  64 y.o.-year-old patient lying in the bed with no acute distress.  EYES: Pupils equal, round, reactive to light and accommodation. No scleral icterus. Extraocular muscles intact.  HEENT: Head atraumatic, normocephalic. Oropharynx and nasopharynx clear.  NECK:  Supple, no jugular venous distention. No thyroid enlargement, no tenderness.  LUNGS: Normal breath sounds bilaterally, no wheezing, rales,rhonchi or crepitation. No use of accessory muscles of respiration.  CARDIOVASCULAR: S1, S2 normal. No murmurs, rubs, or gallops.  ABDOMEN: Soft, nontender, nondistended. Bowel sounds present. No organomegaly or mass.  EXTREMITIES: left foot gangrene/left leg  celulitis, toes cold to touch on left side. NEUROLOGIC: Cranial nerves II through XII are intact. Muscle strength 5/5 in all extremities. Sensation intact. Gait not checked.  PSYCHIATRIC: The patient is alert and oriented x 3.  SKIN: left foot gangrene   LABORATORY PANEL:   CBC  Recent Labs Lab 08/09/15 0506  WBC 20.1*  HGB 8.0*  HCT 24.1*  PLT 392   ------------------------------------------------------------------------------------------------------------------  Chemistries   Recent Labs Lab 08/03/15 1716  08/09/15 0506  NA 125*  < > 134*  K 3.1*  < > 3.8  CL 87*  < > 102  CO2 20*  < > 25  GLUCOSE 327*  < > 94  BUN 12  < > 12  CREATININE 0.89  < > 1.22*  CALCIUM 8.3*  < > 8.0*  AST 29  --   --   ALT 25  --   --   ALKPHOS 138*  --   --   BILITOT 1.5*  --   --   < > = values in this interval not displayed. ------------------------------------------------------------------------------------------------------------------  Cardiac Enzymes No results for input(s): TROPONINI in the last 168 hours. ------------------------------------------------------------------------------------------------------------------  RADIOLOGY:  Dg Chest 2 View  08/09/2015   CLINICAL DATA:  New onset fever.  EXAM: CHEST  2 VIEW  COMPARISON:  08/03/2015 prior chest radiographs dating back to 10/30/2009  FINDINGS: Cardiomegaly identified.  Small bilateral pleural effusions and bibasilar atelectasis are now noted.  There is no evidence of focal airspace disease, pulmonary edema, suspicious pulmonary nodule/mass or pneumothorax. No acute bony abnormalities are identified.  IMPRESSION: New small bilateral pleural effusions and bibasilar atelectasis.  Cardiomegaly.   Electronically Signed   By:  Margarette Canada M.D.   On: 08/09/2015 11:10     ASSESSMENT AND PLAN:  * Left foot cellulitis and gangrene with sepsis Appreciate podiatry help. On IV abx. Strep agalactie in blood culture. Wound cx have GNR on  stain- but no growth. Appreciated Consult to infectious disease D/Ced vancomycin , she received only one dose of zosyn on admission- resume zosyn on 08/08/15. WBC still high- slowly coming down. On 08/08/15 night - again had fever and hypotension- sent Xray , Blood cx- and vancomycin restarted.  * Peripheral arterial disease Status post PCI for left lower extremity disease.  * Hyponatremia/hypokalemia Improved. Hold daily Kcl.  * DM 2 Sliding scale insulin with diabetic diet.  *chronic afib. Rate Controlled. On sotalol. ASA Patient will need follow-up with cardiology as outpatient for assessment on need for anticoagulation.  * Anemia of chronic disease  * h/o diabetic neuropathy;on Neurontin  * Moderate Malnutrition due to acute illness;continue mighty shakes BID.  All the records are reviewed and case discussed with Care Management/Social Workerr. Management plans discussed with the patient, family and they are in agreement.  CODE STATUS: full  TOTAL TIME TAKING CARE OF THIS PATIENT:35 minutes.   POSSIBLE D/C IN 3-4 DAYS, DEPENDING ON CLINICAL CONDITION.   [name] M.D on 08/09/2015 at 4:27 PM  Between 7am to 6pm - Pager - [phone]  After 6pm go to [email or web address] - password [email or web address] [facility]  Office  [phone]  CC: Primary care physician; [name], DO

## 2015-08-10 LAB — CBC
HEMATOCRIT: 24.4 % — AB (ref 35.0–47.0)
HEMOGLOBIN: 7.9 g/dL — AB (ref 12.0–16.0)
MCH: 25.9 pg — ABNORMAL LOW (ref 26.0–34.0)
MCHC: 32.3 g/dL (ref 32.0–36.0)
MCV: 80.1 fL (ref 80.0–100.0)
Platelets: 448 10*3/uL — ABNORMAL HIGH (ref 150–440)
RBC: 3.04 MIL/uL — ABNORMAL LOW (ref 3.80–5.20)
RDW: 14 % (ref 11.5–14.5)
WBC: 18.1 10*3/uL — AB (ref 3.6–11.0)

## 2015-08-10 LAB — GLUCOSE, CAPILLARY
GLUCOSE-CAPILLARY: 114 mg/dL — AB (ref 65–99)
GLUCOSE-CAPILLARY: 132 mg/dL — AB (ref 65–99)
Glucose-Capillary: 84 mg/dL (ref 65–99)
Glucose-Capillary: 135 mg/dL — ABNORMAL HIGH (ref 65–99)

## 2015-08-10 NOTE — Care Management (Signed)
Lantus and Novolog co pay is cost prohibitive with patient insurance over $250. Consider  diabetes nurse recommendations more affordable insulin such as Novolin R, Novolin 70/30, or Novolin NPH which can be purchased at [facility] for $25 per vial.

## 2015-08-10 NOTE — Care Management Important Message (Signed)
Important Message  Patient Details  Name: [name] MRN: [id number] Date of Birth: 10/18/1951   Medicare Important Message Given:  Yes-third notification given    Alvie Heidelberg, RN 08/10/2015, 11:24 AM

## 2015-08-10 NOTE — Progress Notes (Signed)
[facility] [place] NAME: [name]    MR#:  [id number]  DATE OF BIRTH:  May 22, 1951  SUBJECTIVE:  CHIEF COMPLAINT:   Chief Complaint  Patient presents with  . Blood Infection   Pain left foot improved.  No more fever in 24 hrs. Left Foot is still cold and numb.  REVIEW OF SYSTEMS:   ROS CONSTITUTIONAL: positive for fever, fatigue or weakness.  EYES: No blurred or double vision.  EARS, NOSE, AND THROAT: No tinnitus or ear pain.  RESPIRATORY: No cough, shortness of breath, wheezing or hemoptysis.  CARDIOVASCULAR: No chest pain, orthopnea, edema.  GASTROINTESTINAL: No nausea, vomiting, or abdominal pain. Diarrhea  GENITOURINARY: No dysuria, hematuria.  ENDOCRINE: No polyuria, nocturia,  HEMATOLOGY: No anemia, easy bruising or bleeding SKIN: Left foot redness, ulcer MUSCULOSKELETAL: left  Leg cellulitis NEUROLOGIC: h/o neuropathy  PSYCHIATRY: No anxiety or depression.   DRUG ALLERGIES:   Allergies  Allergen Reactions  . Codeine Nausea And Vomiting  . Tape Rash    VITALS:  Blood pressure 146/95, pulse 109, temperature 101.6 F (38.7 C), temperature source Oral, resp. rate 17, height 5\' 2"  (1.575 m), weight 62.914 kg (138 lb 11.2 oz), SpO2 96 %.  PHYSICAL EXAMINATION:  GENERAL:  64 y.o.-year-old patient lying in the bed with no acute distress.  EYES: Pupils equal, round, reactive to light and accommodation. No scleral icterus. Extraocular muscles intact.  HEENT: Head atraumatic, normocephalic. Oropharynx and nasopharynx clear.  NECK:  Supple, no jugular venous distention. No thyroid enlargement, no tenderness.  LUNGS: Normal breath sounds bilaterally, no wheezing, rales,rhonchi or crepitation. No use of accessory muscles of respiration.  CARDIOVASCULAR: S1, S2 normal. No murmurs, rubs, or gallops.  ABDOMEN: Soft, nontender, nondistended. Bowel sounds present. No organomegaly or mass.  EXTREMITIES: left foot  gangrene/left leg celulitis, toes cold to touch on left side. NEUROLOGIC: Cranial nerves II through XII are intact. Muscle strength 5/5 in all extremities. Sensation intact. Gait not checked.  PSYCHIATRIC: The patient is alert and oriented x 3.  SKIN: left foot gangrene   LABORATORY PANEL:   CBC  Recent Labs Lab 08/10/15 0443  WBC 18.1*  HGB 7.9*  HCT 24.4*  PLT 448*   ------------------------------------------------------------------------------------------------------------------  Chemistries   Recent Labs Lab 08/09/15 0506  NA 134*  K 3.8  CL 102  CO2 25  GLUCOSE 94  BUN 12  CREATININE 1.22*  CALCIUM 8.0*   ------------------------------------------------------------------------------------------------------------------  Cardiac Enzymes No results for input(s): TROPONINI in the last 168 hours. ------------------------------------------------------------------------------------------------------------------  RADIOLOGY:  Dg Chest 2 View  08/09/2015   CLINICAL DATA:  New onset fever.  EXAM: CHEST  2 VIEW  COMPARISON:  08/03/2015 prior chest radiographs dating back to 10/30/2009  FINDINGS: Cardiomegaly identified.  Small bilateral pleural effusions and bibasilar atelectasis are now noted.  There is no evidence of focal airspace disease, pulmonary edema, suspicious pulmonary nodule/mass or pneumothorax. No acute bony abnormalities are identified.  IMPRESSION: New small bilateral pleural effusions and bibasilar atelectasis.  Cardiomegaly.   Electronically Signed   By: Margarette Canada M.D.   On: 08/09/2015 11:10     ASSESSMENT AND PLAN:  * Left foot cellulitis and gangrene with sepsis Appreciate podiatry help. On IV abx. Strep agalactie in blood culture. Wound cx have GNR on stain- but no growth. Appreciated Consult to infectious disease D/Ced vancomycin , she received only one dose of zosyn on admission- resume zosyn on 08/08/15. WBC still high- slowly coming down. On  08/08/15 night - again had fever and hypotension- sent Xray , Blood cx- and vancomycin restarted.  * Peripheral arterial disease Status post PCI for left lower extremity disease.  still not good circulation on left foot- may need amputation.  She would like to have it done- if needed, by Dr. Lucky Cowboy.  * Hyponatremia/hypokalemia Improved. Hold daily Kcl.  * DM 2 Sliding scale insulin with diabetic diet.  *chronic afib.   rate controlled.  *  Anemia of chronic disease- stable Hb.   * h/o diabetic neuropathy;on Neurontin  * Moderate Malnutrition due to acute illness;continue mighty shakes BID.  All the records are reviewed and case discussed with Care Management/Social Workerr. Management plans discussed with the patient, family and they are in agreement.  CODE STATUS: full  TOTAL TIME TAKING CARE OF THIS PATIENT:35 minutes.   POSSIBLE D/C IN 3-4 DAYS, DEPENDING ON CLINICAL CONDITION.   [name] M.D on 08/10/2015 at 5:32 PM  Between 7am to 6pm - Pager - [phone]  After 6pm go to [email or web address] - password [email or web address] [facility]  Office  [phone]  CC: Primary care physician; [name], DO

## 2015-08-10 NOTE — Progress Notes (Signed)
5 Days Post-Op  Subjective: Patient seen. States she had a little bit of a setback yesterday. Feeling better today.  Objective: Vital signs in last 24 hours: Temp:  [98.6 F (37 C)-99.5 F (37.5 C)] 98.6 F (37 C) (09/05 0735) Pulse Rate:  [65-98] 73 (09/05 0753) Resp:  [17-20] 17 (09/04 2350) BP: (92-120)/(39-66) 113/66 mmHg (09/05 0753) SpO2:  [94 %-100 %] 98 % (09/05 0735) Last BM Date: 08/09/15  Intake/Output from previous day: 09/04 0701 - 09/05 0700 In: 840 [P.O.:840] Out: 1800 [Urine:1800] Intake/Output this shift: Total I/O In: 240 [P.O.:240] Out: 650 [Urine:650]  Continued full-thickness ulceration in the right third interspace. No significant purulence or cellulitis. Still a cyanotic appearance to the toes and forefoot on the left. Delayed capillary refill to the digits. Does not appear significantly worse but no real signs of improvement either.  Lab Results:   Recent Labs  08/09/15 0506 08/10/15 0443  WBC 20.1* 18.1*  HGB 8.0* 7.9*  HCT 24.1* 24.4*  PLT 392 448*   BMET  Recent Labs  08/08/15 0448 08/09/15 0506  NA 134* 134*  K 3.8 3.8  CL 105 102  CO2 25 25  GLUCOSE 92 94  BUN 13 12  CREATININE 1.21* 1.22*  CALCIUM 7.9* 8.0*   PT/INR No results for input(s): LABPROT, INR in the last 72 hours. ABG No results for input(s): PHART, HCO3 in the last 72 hours.  Invalid input(s): PCO2, PO2  Studies/Results: Dg Chest 2 View  08/09/2015   CLINICAL DATA:  New onset fever.  EXAM: CHEST  2 VIEW  COMPARISON:  08/03/2015 prior chest radiographs dating back to 10/30/2009  FINDINGS: Cardiomegaly identified.  Small bilateral pleural effusions and bibasilar atelectasis are now noted.  There is no evidence of focal airspace disease, pulmonary edema, suspicious pulmonary nodule/mass or pneumothorax. No acute bony abnormalities are identified.  IMPRESSION: New small bilateral pleural effusions and bibasilar atelectasis.  Cardiomegaly.   Electronically Signed   By:  Margarette Canada M.D.   On: 08/09/2015 11:10    Anti-infectives: Anti-infectives    Start     Dose/Rate Route Frequency Ordered Stop   08/09/15 2030  vancomycin (VANCOCIN) IVPB 1000 mg/200 mL premix     1,000 mg 200 mL/hr over 60 Minutes Intravenous Every 24 hours 08/09/15 1313     08/09/15 1315  vancomycin (VANCOCIN) IVPB 1000 mg/200 mL premix     1,000 mg 200 mL/hr over 60 Minutes Intravenous  Once 08/09/15 1313 08/09/15 1821   08/08/15 1100  piperacillin-tazobactam (ZOSYN) IVPB 3.375 g     3.375 g 12.5 mL/hr over 240 Minutes Intravenous 3 times per day 08/08/15 1046     08/04/15 0900  vancomycin (VANCOCIN) IVPB 1000 mg/200 mL premix  Status:  Discontinued     1,000 mg 200 mL/hr over 60 Minutes Intravenous Every 18 hours 08/03/15 2325 08/07/15 1119   08/03/15 2330  vancomycin (VANCOCIN) IVPB 1000 mg/200 mL premix     1,000 mg 200 mL/hr over 60 Minutes Intravenous  Once 08/03/15 2325 08/04/15 0150   08/03/15 1915  piperacillin-tazobactam (ZOSYN) IVPB 3.375 g     3.375 g 12.5 mL/hr over 240 Minutes Intravenous  Once 08/03/15 1902 08/03/15 2315      Assessment/Plan: s/p Procedure(s): Lower Extremity Angiography (Left) Lower Extremity Intervention Discussed with the patient that there is no real sign of improvement in the left foot. Had an honest discussion with her about options and she seems to be accepting the fact that she most likely  will need a below-knee amputation. We discussed that there do not appear to be any significant salvage options for the left foot. The wounds on both feet were redressed. She will also discuss this with her internal medicine doctor. States she would prefer to have Dr. Lucky Cowboy perform this if needed.  LOS: 7 days    Iisha Soyars W. 08/10/2015

## 2015-08-11 LAB — CBC
HCT: 25.7 % — ABNORMAL LOW (ref 35.0–47.0)
Hemoglobin: 8.4 g/dL — ABNORMAL LOW (ref 12.0–16.0)
MCH: 26.2 pg (ref 26.0–34.0)
MCHC: 32.9 g/dL (ref 32.0–36.0)
MCV: 79.7 fL — ABNORMAL LOW (ref 80.0–100.0)
PLATELETS: 492 10*3/uL — AB (ref 150–440)
RBC: 3.22 MIL/uL — AB (ref 3.80–5.20)
RDW: 13.8 % (ref 11.5–14.5)
WBC: 18 10*3/uL — AB (ref 3.6–11.0)

## 2015-08-11 LAB — BASIC METABOLIC PANEL
ANION GAP: 6 (ref 5–15)
BUN: 13 mg/dL (ref 6–20)
CALCIUM: 8.3 mg/dL — AB (ref 8.9–10.3)
CO2: 26 mmol/L (ref 22–32)
Chloride: 101 mmol/L (ref 101–111)
Creatinine, Ser: 1.22 mg/dL — ABNORMAL HIGH (ref 0.44–1.00)
GFR calc Af Amer: 52 mL/min — ABNORMAL LOW (ref >60)
GFR, EST NON AFRICAN AMERICAN: 45 mL/min — AB (ref >60)
GLUCOSE: 151 mg/dL — AB (ref 65–99)
POTASSIUM: 4.3 mmol/L (ref 3.5–5.1)
SODIUM: 133 mmol/L — AB (ref 135–145)

## 2015-08-11 LAB — GLUCOSE, CAPILLARY
GLUCOSE-CAPILLARY: 122 mg/dL — AB (ref 65–99)
GLUCOSE-CAPILLARY: 100 mg/dL — AB (ref 65–99)
GLUCOSE-CAPILLARY: 127 mg/dL — AB (ref 65–99)
Glucose-Capillary: 180 mg/dL — ABNORMAL HIGH (ref 65–99)

## 2015-08-11 MED ORDER — LIVING WELL WITH DIABETES BOOK
Freq: Once | Status: AC
  Administered 2015-08-11: 14:00:00
  Filled 2015-08-11: qty 1

## 2015-08-11 MED ORDER — BOOST / RESOURCE BREEZE PO LIQD
1.0000 | Freq: Three times a day (TID) | ORAL | Status: DC
  Administered 2015-08-12 – 2015-08-13 (×5): 1 via ORAL

## 2015-08-11 MED ORDER — INSULIN STARTER KIT- SYRINGES (ENGLISH)
1.0000 | Freq: Once | Status: AC
  Administered 2015-08-11: 1
  Filled 2015-08-11: qty 1

## 2015-08-11 NOTE — Progress Notes (Signed)
[facility] [place] NAME: [name]    MR#:  [id number]  DATE OF BIRTH:  09-Apr-1951  SUBJECTIVE:  Patient is unsure if she wants an amputation of her lower extremity  REVIEW OF SYSTEMS:    Review of Systems  Constitutional: Negative for fever, chills and malaise/fatigue.  HENT: Negative for sore throat.   Eyes: Negative for blurred vision.  Respiratory: Negative for cough, hemoptysis, shortness of breath and wheezing.   Cardiovascular: Negative for chest pain, palpitations and leg swelling.  Gastrointestinal: Negative for nausea, vomiting, abdominal pain, diarrhea and blood in stool.  Genitourinary: Negative for dysuria.  Musculoskeletal: Negative for back pain.  Skin:       Wounds on foot  Neurological: Negative for dizziness, tremors and headaches.  Endo/Heme/Allergies: Does not bruise/bleed easily.    Tolerating Diet:yes      DRUG ALLERGIES:   Allergies  Allergen Reactions  . Codeine Nausea And Vomiting  . Tape Rash    VITALS:  Blood pressure 139/63, pulse 72, temperature 99.6 F (37.6 C), temperature source Oral, resp. rate 16, height 5\' 2"  (1.575 m), weight 62.914 kg (138 lb 11.2 oz), SpO2 97 %.  PHYSICAL EXAMINATION:   Physical Exam  Constitutional: She is oriented to person, place, and time and well-developed, well-nourished, and in no distress. No distress.  HENT:  Head: Normocephalic.  Eyes: No scleral icterus.  Neck: Normal range of motion. Neck supple. No JVD present. No tracheal deviation present.  Cardiovascular: Normal rate, regular rhythm and normal heart sounds.  Exam reveals no gallop and no friction rub.   No murmur heard. Pulmonary/Chest: Effort normal and breath sounds normal. No respiratory distress. She has no wheezes. She has no rales. She exhibits no tenderness.  Abdominal: Soft. Bowel sounds are normal. She exhibits no distension and no mass. There is no tenderness. There is no  rebound and no guarding.  Musculoskeletal: Normal range of motion. She exhibits no edema.  Neurological: She is alert and oriented to person, place, and time.  Skin: Skin is warm. No rash noted. No erythema.  Feet are wrapped  Psychiatric: Affect and judgment normal.      LABORATORY PANEL:   CBC  Recent Labs Lab 08/11/15 0509  WBC 18.0*  HGB 8.4*  HCT 25.7*  PLT 492*   ------------------------------------------------------------------------------------------------------------------  Chemistries   Recent Labs Lab 08/11/15 0509  NA 133*  K 4.3  CL 101  CO2 26  GLUCOSE 151*  BUN 13  CREATININE 1.22*  CALCIUM 8.3*   ------------------------------------------------------------------------------------------------------------------  Cardiac Enzymes No results for input(s): TROPONINI in the last 168 hours. ------------------------------------------------------------------------------------------------------------------  RADIOLOGY:  No results found.   ASSESSMENT AND PLAN:   64 year old female with peripheral arterial disease status post PCI for left lower extremity who presented with left foot cellulitis with gangrene and sepsis.  1. Sepsis: Patient has group B strep in blood culture. Patient is being followed by infectious disease. Patient's currently on Zosyn and vancomycin. Continue recommendations as per ID. Repeat blood culture negative date. Wound culture is negative to date as well.  2. Left foot gangrene: Patient is status post left lower extremity angioplasty and has been seen and evaluated by podiatry. They are recommending probable amputation as lower extremity still cyanotic in appearance and there is no improvement. Patient would like to speak with Dr. Elvina Mattes and Dr. Lazaro Arms. Continue current antibiotics and follow-up on recommendations as per consultants.  3. Peripheral arterial disease: Status post PCI. Continue  aspirin and statin  4.  Hyponatremia/hypokalemia: Repleted and improved  5. Type 2 diabetes: Patient will continue on sliding scale insulin, Amaryl and Lantus. Patient will need NPH 6 units twice a day instead of Lantus at discharge with follow-up by her PCP.   6. Chronic atrial fibrillation: Patient's heart rate is controlled.   Management plans discussed with the patient and she is in agreement.  CODE STATUS: FULL  TOTAL TIME TAKING CARE OF THIS PATIENT: 25 minutes.     POSSIBLE D/C 3-4 days, DEPENDING ON CLINICAL CONDITION.   Brewster Wolters M.D on 08/11/2015 at 12:27 PM  Between 7am to 6pm - Pager - [phone] After 6pm go to [email or web address] - password [email or web address] [facility]  Office  [phone]  CC: Primary care physician; [name], DO

## 2015-08-11 NOTE — Progress Notes (Signed)
Nutrition Follow-up  DOCUMENTATION CODES:   Non-severe (moderate) malnutrition in context of acute illness/injury  INTERVENTION:  Meals and snacks: cater to pt preferences Nutrition Supplement Therapy: Pt does not like glucerna, willing to try boost breeze TID for added nutrition   NUTRITION DIAGNOSIS:   Inadequate oral intake related to acute illness as evidenced by per patient/family report, being addressed with solid foods and supplement    GOAL:   Patient will meet greater than or equal to 90% of their needs  Progressing towards meeting goals  MONITOR:    (Energy intake)  REASON FOR ASSESSMENT:   Consult Poor PO  ASSESSMENT:      Possible BKA per MD note   Current Nutrition: Ate grits, bacon, yogurt this am.  Pt reports appetite is better, eating about 50% or more off trays.  Likes the cottage cheese and fruit plate   Gastrointestinal Profile: Last BM: 9/05   Medications: vit b6, vit E, glimepride, aspart, lantus  Electrolyte/Renal Profile and Glucose Profile:   Recent Labs Lab 08/08/15 0448 08/09/15 0506 08/11/15 0509  NA 134* 134* 133*  K 3.8 3.8 4.3  CL 105 102 101  CO2 25 25 26   BUN 13 12 13   CREATININE 1.21* 1.22* 1.22*  CALCIUM 7.9* 8.0* 8.3*  GLUCOSE 92 94 151*      Weight Trend since Admission: Filed Weights   08/03/15 1701 08/03/15 2256  Weight: 139 lb (63.05 kg) 138 lb 11.2 oz (62.914 kg)      Diet Order:  Diet Carb Modified Fluid consistency:: Thin; Room service appropriate?: Yes  Skin:   reviewed   Height:   Ht Readings from Last 1 Encounters:  08/03/15 5\' 2"  (1.575 m)    Weight:   Wt Readings from Last 1 Encounters:  08/03/15 138 lb 11.2 oz (62.914 kg)        BMI:  Body mass index is 25.36 kg/(m^2).  Estimated Nutritional Needs:   Kcal:  BEE 1128 kcals (IF 1.1-1.3, AF 1.3) 3716-9678 kcals/d.   Protein:  (1.0-1.2 g/kg) 63-76 g/d  Fluid:  (30-67ml/kg) 1890-2255ml/d  EDUCATION NEEDS:   No education  needs identified at this time  [place]. Zenia Resides, [place], [place] (pager)

## 2015-08-11 NOTE — Progress Notes (Signed)
Inpatient Diabetes Program Recommendations  AACE/ADA: New Consensus Statement on Inpatient Glycemic Control (2013)  Target Ranges:  Prepandial:   less than 140 mg/dL      Peak postprandial:   less than 180 mg/dL (1-2 hours)      Critically ill patients:  140 - 180 mg/dL   Reason for Visit: insulin review  Diabetes history: Type 2 Outpatient Diabetes medications: Glucophage 525m bid, Amaryl 243mday Current orders for Inpatient glycemic control: Novolog 0-5 units qhs, Novolog 0-15 units tid with meals, Amaryl 55m38may, Lantus 13 units qhs  Met with patient at her bedside to review insulin.  She reports taking insulin in the past- fearful of injecting air- reassured and reviewed technique of insulin administration.  Confirmed correct insulin preparation of insulin using the teach back method.  Discussed Lantus, Novolog and NPH insulin properties- discussed and drew pictures of peaks of each of the insulins, the properties of each, administration, site rotation, storage and the treatment of hypoglycemia.  Patient assures me she has a meter and testing strips that are within date.  She is willing to test blood sugars, will write them down and bring them with her to her next doctor's visit.   I have written this information down, circled the info in her Living [facility] with Diabetes notebook, and ordered her an insulin syringe teaching kit.  I have answered all her questions and given her the phone number to the [facility] and to the [facility].   [name], BA, MHA, CDE Diabetes Coordinator Inpatient Diabetes Program  [phone] Pager) [phone] Quarter/05/2015 2:55 PM

## 2015-08-11 NOTE — Progress Notes (Signed)
Inpatient Diabetes Program Recommendations  AACE/ADA: New Consensus Statement on Inpatient Glycemic Control (2013)  Target Ranges:  Prepandial:   less than 140 mg/dL      Peak postprandial:   less than 180 mg/dL (1-2 hours)      Critically ill patients:  140 - 180 mg/dL   Results for SHELAINE, FRIE (MRN [id number]) as of 08/11/2015 11:48  Ref. Range 08/10/2015 07:31 08/10/2015 11:35 08/10/2015 16:31 08/10/2015 21:07 08/11/2015 07:41 08/11/2015 11:34  Glucose-Capillary Latest Ref Range: 65-99 mg/dL 84 135 (H) 114 (H) 132 (H) 122 (H) 180 (H)   Current orders for Inpatient glycemic control: Lantus 13 units QHS, Novolog 015 units TID with meals, Novolog 0-5 units HS, Amaryl 2 mg QAM  Inpatient Diabetes Program Recommendations Insulin - Basal: Patient's co-pays for Lantus and Novolog will be over $250; therefore patient will need more affordable insulin if discharged on insulin for glycemic control. If patient will discharge on insulin, please consider ordering NOVOLIN NPH BID (equivalent to current total Lantus being given as an inpatient). For example if continues on Lantus 13 units QHS as an inpatient, would consider prescribing NPH 6 units BID and have patient follow up with her PCP for further DM medication adjustments.  Thanks, Barnie Alderman, RN, MSN, CCRN, CDE Diabetes Coordinator Inpatient Diabetes Program [phone] (Team Pager from [place] to [place]) [phone] ([facility] office) [phone] [facility] office) [phone] [facility] office)

## 2015-08-11 NOTE — Progress Notes (Signed)
[facility] INFECTIOUS DISEASE PROGRESS NOTE Date of Admission:  08/03/2015     ID: [name] is a 64 y.o. female with gangrene Active Problems:   Cellulitis   Malnutrition of moderate degree  Subjective: Having recurrent fevers, wbc sill 18.   ROS  Eleven systems are reviewed and negative except per hpi  Medications:  Antibiotics Given (last 72 hours)    Date/Time Action Medication Dose Rate   08/08/15 1740 Given   piperacillin-tazobactam (ZOSYN) IVPB 3.375 g 3.375 g 12.5 mL/hr   08/09/15 0220 Given   piperacillin-tazobactam (ZOSYN) IVPB 3.375 g 3.375 g 12.5 mL/hr   08/09/15 1306 Given   piperacillin-tazobactam (ZOSYN) IVPB 3.375 g 3.375 g 12.5 mL/hr   08/09/15 1721 Given   vancomycin (VANCOCIN) IVPB 1000 mg/200 mL premix 1,000 mg 200 mL/hr   08/09/15 2004 Given   piperacillin-tazobactam (ZOSYN) IVPB 3.375 g 3.375 g 12.5 mL/hr   08/09/15 2128 Given   vancomycin (VANCOCIN) IVPB 1000 mg/200 mL premix 1,000 mg 200 mL/hr   08/10/15 0339 Given   piperacillin-tazobactam (ZOSYN) IVPB 3.375 g 3.375 g 12.5 mL/hr   08/10/15 1036 Given   piperacillin-tazobactam (ZOSYN) IVPB 3.375 g 3.375 g 12.5 mL/hr   08/10/15 1845 Given   piperacillin-tazobactam (ZOSYN) IVPB 3.375 g 3.375 g 12.5 mL/hr   08/10/15 2029 Given   vancomycin (VANCOCIN) IVPB 1000 mg/200 mL premix 1,000 mg 200 mL/hr   08/11/15 0412 Given   piperacillin-tazobactam (ZOSYN) IVPB 3.375 g 3.375 g 12.5 mL/hr   08/11/15 1206 Given   piperacillin-tazobactam (ZOSYN) IVPB 3.375 g 3.375 g 12.5 mL/hr     . aspirin EC  81 mg Oral BID  . atorvastatin  40 mg Oral QHS  . enoxaparin (LOVENOX) injection  40 mg Subcutaneous Q24H  . feeding supplement  1 Container Oral TID WC  . gabapentin  100 mg Oral TID  . glimepiride  2 mg Oral Q breakfast  . insulin aspart  0-15 Units Subcutaneous TID WC  . insulin aspart  0-5 Units Subcutaneous QHS  . insulin glargine  13 Units Subcutaneous QHS  . insulin starter kit- syringes  1 kit  Other Once  . omega-3 acid ethyl esters  1 g Oral Daily  . piperacillin-tazobactam (ZOSYN)  IV  3.375 g Intravenous 3 times per day  . pyridOXINE  100 mg Oral QHS  . sotalol  80 mg Oral BID  . vancomycin  1,000 mg Intravenous Q24H  . vitamin E  400 Units Oral QHS    Objective: Vital signs in last 24 hours: Temp:  [98.4 F (36.9 C)-101.6 F (38.7 C)] 99.6 F (37.6 C) (09/06 0743) Pulse Rate:  [72-109] 72 (09/06 0743) Resp:  [16-17] 16 (09/05 2335) BP: (93-146)/(47-95) 139/63 mmHg (09/06 0743) SpO2:  [96 %-98 %] 97 % (09/06 0743) Constitutional: oriented to person, place, and time. appears well-developed chronically ill appearing HENT: [place]/AT, PERRLA, no scleral icterus Mouth/Throat: Oropharynx is clear and moist. No oropharyngeal exudate.  Cardiovascular: Normal rate, regular rhythm and normal heart sounds. Pulmonary/Chest: Effort normal and breath sounds normal. No respiratory distress. has no wheezes.  Neck = supple, no nuchal rigidity Abdominal: Soft. Bowel sounds are normal. exhibits no distension. There is no tenderness.  Lymphadenopathy: no cervical adenopathy. No axillary adenopathy Neurological: alert and oriented to person, place, and time.  Skin: L foot is cool distally but ankle and shin are warm and red - some less redness though Has large necrotic eschar dorsally. TTP  R foot with open drainage area  dosally between 3-4 toes Psychiatric: a normal mood and affect. behavior is normal.   Lab Results  Recent Labs  08/09/15 0506 08/10/15 0443 08/11/15 0509  WBC 20.1* 18.1* 18.0*  HGB 8.0* 7.9* 8.4*  HCT 24.1* 24.4* 25.7*  NA 134*  --  133*  K 3.8  --  4.3  CL 102  --  101  CO2 25  --  26  BUN 12  --  13  CREATININE 1.22*  --  1.22*    Microbiology: Results for orders placed or performed during the hospital encounter of 08/03/15  Culture, blood (routine x 2)     Status: None   Collection Time: 08/03/15  5:12 PM  Result Value Ref Range Status    Specimen Description BLOOD RIGHT ARM  Final   Special Requests BOTTLES DRAWN AEROBIC AND ANAEROBIC [place]  Final   Culture NO GROWTH 5 DAYS  Final   Report Status 08/08/2015 FINAL  Final  Culture, blood (routine x 2)     Status: None   Collection Time: 08/03/15  5:15 PM  Result Value Ref Range Status   Specimen Description BLOOD LEFT ARM  Final   Special Requests BOTTLES DRAWN AEROBIC AND ANAEROBIC 4CC  Final   Culture  Setup Time   Final    GRAM POSITIVE COCCI IN PAIRS IN CHAINS AEROBIC BOTTLE ONLY CRITICAL RESULT CALLED TO, READ BACK BY AND VERIFIED WITH: MICHAEL JACOBS 08/04/15 1320 JGF    Culture   Final    GROUP B STREP(S.AGALACTIAE)ISOLATED STREPTOCOCCUS SANGUINIS AEROBIC BOTTLE ONLY Virtually 100% of S. agalactiae (Group B) strains are susceptible to Penicillin.  For Penicillin-allergic patients, Erythromycin (85-95% sensitive) and Clindamycin (80% sensitive) are drugs of choice. Contact microbiology lab to request sensitivities if  needed within 7 days. ORG 2 SUGGESTIVE OF CONTAMINATION    Report Status 08/07/2015 FINAL  Final   Organism ID, Bacteria GROUP B STREP(S.AGALACTIAE)ISOLATED  Final      Susceptibility   Group b strep(s.agalactiae)isolated - MIC*    CLINDAMYCIN SENSITIVE Sensitive     CIPROFLOXACIN SENSITIVE Sensitive     VANCOMYCIN SENSITIVE Sensitive     ERYTHROMYCIN SENSITIVE Sensitive     * GROUP B STREP(S.AGALACTIAE)ISOLATED  Urine culture     Status: None   Collection Time: 08/03/15  6:10 PM  Result Value Ref Range Status   Specimen Description URINE, RANDOM  Final   Special Requests NONE  Final   Culture MULTIPLE SPECIES PRESENT, SUGGEST RECOLLECTION  Final   Report Status 08/05/2015 FINAL  Final  Wound culture     Status: None   Collection Time: 08/04/15  6:00 PM  Result Value Ref Range Status   Specimen Description FOOT  Final   Special Requests Immunocompromised  Final   Gram Stain FEW WBC SEEN MODERATE GRAM NEGATIVE RODS   Final   Culture NO  GROWTH 4 DAYS  Final   Report Status 08/09/2015 FINAL  Final  C difficile quick scan w PCR reflex     Status: None   Collection Time: 08/07/15 11:38 AM  Result Value Ref Range Status   C Diff antigen NEGATIVE NEGATIVE Final   C Diff toxin NEGATIVE NEGATIVE Final   C Diff interpretation Negative for C. difficile  Final  Culture, blood (routine x 2)     Status: None (Preliminary result)   Collection Time: 08/09/15  9:12 AM  Result Value Ref Range Status   Specimen Description BLOOD RIGHT FATTY CASTS  Final   Special Requests BOTTLES  DRAWN AEROBIC AND ANAEROBIC  12CC  Final   Culture NO GROWTH 2 DAYS  Final   Report Status PENDING  Incomplete  Culture, blood (routine x 2)     Status: None (Preliminary result)   Collection Time: 08/09/15  9:22 AM  Result Value Ref Range Status   Specimen Description BLOOD LEFT ASSIST CONTROL  Final   Special Requests BOTTLES DRAWN AEROBIC AND ANAEROBIC  12CC  Final   Culture NO GROWTH 2 DAYS  Final   Report Status PENDING  Incomplete    Studies/Results: No results found.  Assessment/Plan: [name] is a 64 y.o. female with very poorly controlled DM (A1C> 11), PN and PVD admitted with gangrenous changes of L foot and ulceration of R foot. She has had revascularization. She has had fevers and elevated wbc. Has been on IV abx with minimal improvement since angiogram. Bcx + for Grp B strep and viridans strep in 1/2 bcx. Wound cx negative.  Some improvement in her cellulitis but L toes still cool. Esr 19.6 and crp 128.  Recommendations Continue  zosyn.  Would dc vanco Will likely benefit from several weeks of abx treatment while area demarcates and further operative planning can take place.  I will follow from [place]. Please call if needed  [place], Cumi [place]   08/11/2015, 3:29 PM

## 2015-08-11 NOTE — Evaluation (Signed)
Physical Therapy Evaluation Patient Details Name: [name] MRN: [id number] DOB: 11-16-1951 Today's Date: 08/11/2015   History of Present Illness  Pt is a 64 y.o. female presenting to hospital with L LE swelling, redness, and blister formation.  Pt admitted with acute L LE cellulitis and gangrene with sepsis and s/p L LE angiography.  Imaging L foot 08/04/15 negative for fx but ? osteomyelitis.  Per verbal discussion with Dr. Lucky Cowboy 08/11/15, ok to get pt up and mobilize pt and pt may WBAT through L LE.  Clinical Impression  Currently pt demonstrates impairments with balance and limitations with functional mobility.  Prior to admission, pt was independent without AD.  Pt lives alone in 1 level home with stairs to enter.  Currently pt is CGA with transfers and ambulation with RW.  Pt would benefit from skilled PT to address above noted impairments and functional limitations.  Recommend pt discharge to home with support of family/friends as needed and HHPT when medically appropriate.     Follow Up Recommendations Home health PT;Other (comment) (Assist for stairs; intermittent assist as needed at home)    Equipment Recommendations  Rolling walker with 5" wheels    Recommendations for Other Services       Precautions / Restrictions Precautions Precautions: Fall Restrictions Weight Bearing Restrictions: Yes LLE Weight Bearing: Weight bearing as tolerated      Mobility  Bed Mobility Overal bed mobility: Needs Assistance Bed Mobility: Supine to Sit     Supine to sit: Modified independent (Device/Increase time);HOB elevated        Transfers Overall transfer level: Needs assistance Equipment used: Rolling walker (2 wheeled) Transfers: Sit to/from [facility] Sit to Stand: Min guard Stand pivot transfers: Min guard (recliner chair to/from commode)          Ambulation/Gait Ambulation/Gait assistance: Min guard Ambulation Distance (Feet): 20 Feet Assistive  device: Rolling walker (2 wheeled) Gait Pattern/deviations: Step-to pattern Gait velocity: decreased   General Gait Details: pt with minimal weight through L LE (through forefoot for balance); steady; limited distance per PT (not patient)  Science writer    Modified Rankin (Stroke Patients Only)       Balance Overall balance assessment: Needs assistance Sitting-balance support: Bilateral upper extremity supported;Feet supported Sitting balance-Leahy Scale: Good     Standing balance support: Bilateral upper extremity supported Standing balance-Leahy Scale: Good                               Pertinent Vitals/Pain Pain Assessment: 0-10 Pain Score: 3  Pain Location: L foot Pain Descriptors / Indicators: Burning Pain Intervention(s): Limited activity within patient's tolerance;Monitored during session;Repositioned  Vitals stable and WFL throughout treatment session.    Home Living Family/patient expects to be discharged to:: Private residence Living Arrangements: Alone Available Help at Discharge: Family;Friend(s) (although pt reports her son is out of state next week)   Home Access: Stairs to enter   [employer] of Steps: 5 from front with B railing or 4 from garage with L railing Home Layout: One level Home Equipment: Walker - standard;Crutches      Prior Function Level of Independence: Independent         Comments: Lives with her cat     Hand Dominance        Extremity/Trunk Assessment   Upper Extremity Assessment: Generalized weakness  Lower Extremity Assessment: Generalized weakness;LLE deficits/detail (R LE WFL)   LLE Deficits / Details: L DF/PF 2/5; MMT deferred L LE (hip flexion and knee flexion/extension at least 3/5 with AROM)     Communication   Communication: No difficulties  Cognition Arousal/Alertness: Awake/alert Behavior During Therapy: WFL for tasks  assessed/performed Overall Cognitive Status: Within Functional Limits for tasks assessed                      General Comments General comments (skin integrity, edema, etc.): bandage intact L LE  Nursing cleared pt for participation in physical therapy.  Pt agreeable to PT session. Per verbal discussion with Dr. Lucky Cowboy, pt may get OOB and WBAT through L LE (nursing reports they have been getting pt up to bedside commode).    Exercises        Assessment/Plan    PT Assessment Patient needs continued PT services  PT Diagnosis Difficulty walking   PT Problem List Decreased strength;Decreased balance;Decreased mobility;Decreased activity tolerance;Pain  PT Treatment Interventions DME instruction;Gait training;Stair training;Functional mobility training;Therapeutic activities;Therapeutic exercise;Balance training;Patient/family education   PT Goals (Current goals can be found in the Care Plan section) Acute Rehab PT Goals Patient Stated Goal: To go home PT Goal Formulation: With patient Time For Goal Achievement: 08/25/15 Potential to Achieve Goals: Good    Frequency Min 2X/week   Barriers to discharge        Co-evaluation               End of Session Equipment Utilized During Treatment: Gait belt Activity Tolerance: Patient tolerated treatment well Patient left: in chair;with call bell/phone within reach;with chair alarm set;with nursing/sitter in room Nurse Communication: Mobility status;Weight bearing status         Time: 1145-1210 PT Time Calculation (min) (ACUTE ONLY): 25 min   Charges:   PT Evaluation $Initial PT Evaluation Tier I: 1 Procedure     PT G CodesLeitha Bleak 08-18-2015, 1:09 PM Leitha Bleak, [place]

## 2015-08-12 LAB — GLUCOSE, CAPILLARY
GLUCOSE-CAPILLARY: 70 mg/dL (ref 65–99)
GLUCOSE-CAPILLARY: 214 mg/dL — AB (ref 65–99)
Glucose-Capillary: 140 mg/dL — ABNORMAL HIGH (ref 65–99)
Glucose-Capillary: 226 mg/dL — ABNORMAL HIGH (ref 65–99)

## 2015-08-12 LAB — CBC
HCT: 23.5 % — ABNORMAL LOW (ref 35.0–47.0)
HEMOGLOBIN: 7.8 g/dL — AB (ref 12.0–16.0)
MCH: 26.5 pg (ref 26.0–34.0)
MCHC: 33 g/dL (ref 32.0–36.0)
MCV: 80.3 fL (ref 80.0–100.0)
PLATELETS: 463 10*3/uL — AB (ref 150–440)
RBC: 2.93 MIL/uL — ABNORMAL LOW (ref 3.80–5.20)
RDW: 13.7 % (ref 11.5–14.5)
WBC: 16.9 10*3/uL — ABNORMAL HIGH (ref 3.6–11.0)

## 2015-08-12 LAB — BASIC METABOLIC PANEL
Anion gap: 8 (ref 5–15)
BUN: 11 mg/dL (ref 6–20)
CALCIUM: 8.3 mg/dL — AB (ref 8.9–10.3)
CO2: 25 mmol/L (ref 22–32)
CREATININE: 1.19 mg/dL — AB (ref 0.44–1.00)
Chloride: 102 mmol/L (ref 101–111)
GFR, EST AFRICAN AMERICAN: 54 mL/min — AB (ref >60)
GFR, EST NON AFRICAN AMERICAN: 47 mL/min — AB (ref >60)
Glucose, Bld: 70 mg/dL (ref 65–99)
Potassium: 3.9 mmol/L (ref 3.5–5.1)
SODIUM: 135 mmol/L (ref 135–145)

## 2015-08-12 NOTE — Progress Notes (Signed)
[facility] [place] NAME: [name]    MR#:  [id number]  DATE OF BIRTH:  03-29-1951  SUBJECTIVE:  Patient is unsure if she wants an amputation of her lower extremity  REVIEW OF SYSTEMS:    Review of Systems  Constitutional: Negative for fever, chills and malaise/fatigue.  HENT: Negative for sore throat.   Eyes: Negative for blurred vision.  Respiratory: Negative for cough, hemoptysis, shortness of breath and wheezing.   Cardiovascular: Negative for palpitations and leg swelling.  Gastrointestinal: Negative for nausea, vomiting, abdominal pain, diarrhea and blood in stool.  Genitourinary: Negative for dysuria.  Musculoskeletal: Negative for back pain.  Skin:       Left foot cold and blue  Neurological: Negative for dizziness, tremors and headaches.  Endo/Heme/Allergies: Does not bruise/bleed easily.    Tolerating Diet:yes      DRUG ALLERGIES:   Allergies  Allergen Reactions  . Codeine Nausea And Vomiting  . Tape Rash    VITALS:  Blood pressure 108/57, pulse 89, temperature 98.4 F (36.9 C), temperature source Oral, resp. rate 18, height 5\' 2"  (1.575 m), weight 62.914 kg (138 lb 11.2 oz), SpO2 98 %.  PHYSICAL EXAMINATION:   Physical Exam  Constitutional: She is oriented to person, place, and time and well-developed, well-nourished, and in no distress. No distress.  HENT:  Head: Normocephalic.  Eyes: No scleral icterus.  Neck: Normal range of motion. Neck supple. No JVD present. No tracheal deviation present.  Cardiovascular: Normal rate, regular rhythm and normal heart sounds.  Exam reveals no gallop and no friction rub.   No murmur heard. Pulmonary/Chest: Effort normal and breath sounds normal. No respiratory distress. She has no wheezes. She has no rales. She exhibits no tenderness.  Abdominal: Soft. Bowel sounds are normal. She exhibits no distension and no mass. There is no tenderness. There is no rebound  and no guarding.  Musculoskeletal: Normal range of motion. She exhibits no edema.  Neurological: She is alert and oriented to person, place, and time.  Skin: Skin is warm. No rash noted. No erythema.  Left foot is cold to touch and blue  Psychiatric: Affect and judgment normal.      LABORATORY PANEL:   CBC  Recent Labs Lab 08/12/15 0501  WBC 16.9*  HGB 7.8*  HCT 23.5*  PLT 463*   ------------------------------------------------------------------------------------------------------------------  Chemistries   Recent Labs Lab 08/12/15 0501  NA 135  K 3.9  CL 102  CO2 25  GLUCOSE 70  BUN 11  CREATININE 1.19*  CALCIUM 8.3*   ------------------------------------------------------------------------------------------------------------------  Cardiac Enzymes No results for input(s): TROPONINI in the last 168 hours. ------------------------------------------------------------------------------------------------------------------  RADIOLOGY:  No results found.   ASSESSMENT AND PLAN:   64 year old female with peripheral arterial disease status post PCI for left lower extremity who presented with left foot cellulitis with gangrene and sepsis.  1. Sepsis: Patient has group B strep in blood culture. Patient is being followed by infectious disease. Patient's currently on Zosyn  And vancomycin has been discontinued.  Patient will require prolonged IV antibiotics. Repeat blood culture negative date. Wound culture is negative to date as well.  2. Left foot gangrene: Patient is status post left lower extremity angioplasty and has been seen and evaluated by podiatry. They are recommending probable amputation as lower extremity still cyanotic in appearance and there is no improvement. Patient would like to speak with Dr. Elvina Mattes and Dr. Lucky Cowboy. Continue current antibiotics and follow-up on recommendations as  per consultants. I will speak with Dr. Lucky Cowboy today.  3. Peripheral arterial  disease: Status post PCI. Continue aspirin and statin  4. Hyponatremia/hypokalemia: Repleted and improved  5. Type 2 diabetes: Patient will continue on sliding scale insulin, Amaryl and Lantus. Patient will need NPH 6 units twice a day instead of Lantus at discharge with follow-up by her PCP.   6. Chronic atrial fibrillation: Patient's heart rate is controlled.   Management plans discussed with the patient and she is in agreement.  CODE STATUS: FULL  TOTAL TIME TAKING CARE OF THIS PATIENT: 20 minutes.     POSSIBLE D/C 3-4 days, DEPENDING ON CLINICAL CONDITION.   Nima Kemppainen M.D on 08/12/2015 at 10:47 AM  Between 7am to 6pm - Pager - [phone] After 6pm go to [email or web address] - password [email or web address] [facility]  Office  [phone]  CC: Primary care physician; [name], DO

## 2015-08-12 NOTE — Progress Notes (Signed)
[place] INFECTIOUS DISEASE PROGRESS NOTE Date of Admission:  08/03/2015     ID: [name] is a 64 y.o. female with gangrene Active Problems:   Cellulitis   Malnutrition of moderate degree  Subjective: Having recurrent fevers, wbc still elevated.   ROS  Eleven systems are reviewed and negative except per hpi  Medications:  Antibiotics Given (last 72 hours)    Date/Time Action Medication Dose Rate   08/09/15 1721 Given   vancomycin (VANCOCIN) IVPB 1000 mg/200 mL premix 1,000 mg 200 mL/hr   08/09/15 2004 Given   piperacillin-tazobactam (ZOSYN) IVPB 3.375 g 3.375 g 12.5 mL/hr   08/09/15 2128 Given   vancomycin (VANCOCIN) IVPB 1000 mg/200 mL premix 1,000 mg 200 mL/hr   08/10/15 0339 Given   piperacillin-tazobactam (ZOSYN) IVPB 3.375 g 3.375 g 12.5 mL/hr   08/10/15 1036 Given   piperacillin-tazobactam (ZOSYN) IVPB 3.375 g 3.375 g 12.5 mL/hr   08/10/15 1845 Given   piperacillin-tazobactam (ZOSYN) IVPB 3.375 g 3.375 g 12.5 mL/hr   08/10/15 2029 Given   vancomycin (VANCOCIN) IVPB 1000 mg/200 mL premix 1,000 mg 200 mL/hr   08/11/15 0412 Given   piperacillin-tazobactam (ZOSYN) IVPB 3.375 g 3.375 g 12.5 mL/hr   08/11/15 1206 Given   piperacillin-tazobactam (ZOSYN) IVPB 3.375 g 3.375 g 12.5 mL/hr   08/11/15 1714 Given   piperacillin-tazobactam (ZOSYN) IVPB 3.375 g 3.375 g 12.5 mL/hr   08/12/15 1610 Given   piperacillin-tazobactam (ZOSYN) IVPB 3.375 g 3.375 g 12.5 mL/hr   08/12/15 1053 Given   piperacillin-tazobactam (ZOSYN) IVPB 3.375 g 3.375 g 12.5 mL/hr     . aspirin EC  81 mg Oral BID  . atorvastatin  40 mg Oral QHS  . enoxaparin (LOVENOX) injection  40 mg Subcutaneous Q24H  . feeding supplement  1 Container Oral TID WC  . gabapentin  100 mg Oral TID  . glimepiride  2 mg Oral Q breakfast  . insulin aspart  0-15 Units Subcutaneous TID WC  . insulin aspart  0-5 Units Subcutaneous QHS  . insulin glargine  13 Units Subcutaneous QHS  . omega-3 acid ethyl esters  1 g  Oral Daily  . piperacillin-tazobactam (ZOSYN)  IV  3.375 g Intravenous 3 times per day  . pyridOXINE  100 mg Oral QHS  . sotalol  80 mg Oral BID  . vitamin E  400 Units Oral QHS    Objective: Vital signs in last 24 hours: Temp:  [97.9 F (36.6 C)-100.4 F (38 C)] 97.9 F (36.6 C) (09/07 1218) Pulse Rate:  [85-91] 89 (09/07 0729) Resp:  [18] 18 (09/07 0729) BP: (108-126)/(46-57) 108/57 mmHg (09/07 0729) SpO2:  [95 %-98 %] 98 % (09/07 0729) Constitutional: oriented to person, place, and time. appears well-developed chronically ill appearing HENT: [place]/AT, PERRLA, no scleral icterus Mouth/Throat: Oropharynx is clear and moist. No oropharyngeal exudate.  Cardiovascular: Normal rate, regular rhythm and normal heart sounds. Pulmonary/Chest: Effort normal and breath sounds normal. No respiratory distress. has no wheezes.  Neck = supple, no nuchal rigidity Abdominal: Soft. Bowel sounds are normal. exhibits no distension. There is no tenderness.  Lymphadenopathy: no cervical adenopathy. No axillary adenopathy Neurological: alert and oriented to person, place, and time.  Skin: L foot is cool distally but ankle and shin are warm and red - some less redness though Has large necrotic eschar dorsally. TTP  R foot with open drainage area dosally between 3-4 toes Psychiatric: a normal mood and affect. behavior is normal.   Lab Results  Recent  Labs  08/11/15 0509 08/12/15 0501  WBC 18.0* 16.9*  HGB 8.4* 7.8*  HCT 25.7* 23.5*  NA 133* 135  K 4.3 3.9  CL 101 102  CO2 26 25  BUN 13 11  CREATININE 1.22* 1.19*    Microbiology: Results for orders placed or performed during the hospital encounter of 08/03/15  Culture, blood (routine x 2)     Status: None   Collection Time: 08/03/15  5:12 PM  Result Value Ref Range Status   Specimen Description BLOOD RIGHT ARM  Final   Special Requests BOTTLES DRAWN AEROBIC AND ANAEROBIC [place]  Final   Culture NO GROWTH 5 DAYS  Final   Report Status  08/08/2015 FINAL  Final  Culture, blood (routine x 2)     Status: None   Collection Time: 08/03/15  5:15 PM  Result Value Ref Range Status   Specimen Description BLOOD LEFT ARM  Final   Special Requests BOTTLES DRAWN AEROBIC AND ANAEROBIC 4CC  Final   Culture  Setup Time   Final    GRAM POSITIVE COCCI IN PAIRS IN CHAINS AEROBIC BOTTLE ONLY CRITICAL RESULT CALLED TO, READ BACK BY AND VERIFIED WITH: MICHAEL JACOBS 08/04/15 1320 JGF    Culture   Final    GROUP B STREP(S.AGALACTIAE)ISOLATED STREPTOCOCCUS SANGUINIS AEROBIC BOTTLE ONLY Virtually 100% of S. agalactiae (Group B) strains are susceptible to Penicillin.  For Penicillin-allergic patients, Erythromycin (85-95% sensitive) and Clindamycin (80% sensitive) are drugs of choice. Contact microbiology lab to request sensitivities if  needed within 7 days. ORG 2 SUGGESTIVE OF CONTAMINATION    Report Status 08/07/2015 FINAL  Final   Organism ID, Bacteria GROUP B STREP(S.AGALACTIAE)ISOLATED  Final      Susceptibility   Group b strep(s.agalactiae)isolated - MIC*    CLINDAMYCIN SENSITIVE Sensitive     CIPROFLOXACIN SENSITIVE Sensitive     VANCOMYCIN SENSITIVE Sensitive     ERYTHROMYCIN SENSITIVE Sensitive     * GROUP B STREP(S.AGALACTIAE)ISOLATED  Urine culture     Status: None   Collection Time: 08/03/15  6:10 PM  Result Value Ref Range Status   Specimen Description URINE, RANDOM  Final   Special Requests NONE  Final   Culture MULTIPLE SPECIES PRESENT, SUGGEST RECOLLECTION  Final   Report Status 08/05/2015 FINAL  Final  Wound culture     Status: None   Collection Time: 08/04/15  6:00 PM  Result Value Ref Range Status   Specimen Description FOOT  Final   Special Requests Immunocompromised  Final   Gram Stain FEW WBC SEEN MODERATE GRAM NEGATIVE RODS   Final   Culture NO GROWTH 4 DAYS  Final   Report Status 08/09/2015 FINAL  Final  C difficile quick scan w PCR reflex     Status: None   Collection Time: 08/07/15 11:38 AM  Result  Value Ref Range Status   C Diff antigen NEGATIVE NEGATIVE Final   C Diff toxin NEGATIVE NEGATIVE Final   C Diff interpretation Negative for C. difficile  Final  Culture, blood (routine x 2)     Status: None (Preliminary result)   Collection Time: 08/09/15  9:12 AM  Result Value Ref Range Status   Specimen Description BLOOD RIGHT FATTY CASTS  Final   Special Requests BOTTLES DRAWN AEROBIC AND ANAEROBIC  12CC  Final   Culture NO GROWTH 3 DAYS  Final   Report Status PENDING  Incomplete  Culture, blood (routine x 2)     Status: None (Preliminary result)   Collection Time:  08/09/15  9:22 AM  Result Value Ref Range Status   Specimen Description BLOOD LEFT ASSIST CONTROL  Final   Special Requests BOTTLES DRAWN AEROBIC AND ANAEROBIC  12CC  Final   Culture NO GROWTH 3 DAYS  Final   Report Status PENDING  Incomplete    Studies/Results: No results found.  Assessment/Plan: MARTINIQUE PIZZIMENTI is a 64 y.o. female with very poorly controlled DM (A1C> 11), PN and PVD admitted with gangrenous changes of L foot and ulceration of R foot. She has had revascularization. She has had fevers and elevated wbc. Has been on IV abx with minimal improvement since angiogram. Bcx + for Grp B strep and viridans strep in 1/2 bcx. Wound cx negative.  Some improvement in her cellulitis but L toes still cool. Esr 19.6 and crp 128.  Recommendations Continue  zosyn.  If gets surgery further abx recs will follow. If no surgery she is at high risk of worsening infection and sepsis so would rec contining abx. I will follow from [place].  Please call if needed  [place], DAVID   08/12/2015, 2:41 PM

## 2015-08-12 NOTE — Care Management Important Message (Signed)
Important Message  Patient Details  Name: [name] MRN: [id number] Date of Birth: 08-31-1951   Medicare Important Message Given:  Yes-fourth notification given    Darius Bump [place] 08/12/2015, 2:31 PM

## 2015-08-12 NOTE — Care Management (Signed)
Discussed patient care with Dr Benjie Karvonen attending.  See no plan for surgical intervention. Dr Benjie Karvonen stated that she would discuss with Dr Lucky Cowboy and call me back. Patient still with IVABX and WBCs 16.9.  More to follow.

## 2015-08-12 NOTE — Progress Notes (Signed)
[facility]  Daily Progress Note   Subjective  - 7 Days Post-Op  Still with some pain, but stable Low grade fever.  WBC down slightly No major events  Objective Filed Vitals:   08/12/15 0046 08/12/15 0158 08/12/15 0729 08/12/15 1218  BP:   108/57   Pulse:   89   Temp: 100.4 F (38 C) 98.6 F (37 C) 98.4 F (36.9 C) 97.9 F (36.6 C)  TempSrc: Oral Oral Oral Oral  Resp:   18   Height:      Weight:      SpO2:   98%     Intake/Output Summary (Last 24 hours) at 08/12/15 1617 Last data filed at 08/12/15 1130  Gross per 24 hour  Intake    700 ml  Output   1500 ml  Net   -800 ml    PULM  CTAB CV  RRR VASC  Left foot appears to be demarcating in the mid foot level, but still significant questionable tissue at that level.  Toes appear non-salvageable on the left at this point.    Laboratory CBC    Component Value Date/Time   WBC 16.9* 08/12/2015 0501   HGB 7.8* 08/12/2015 0501   HCT 23.5* 08/12/2015 0501   PLT 463* 08/12/2015 0501    BMET    Component Value Date/Time   NA 135 08/12/2015 0501   K 3.9 08/12/2015 0501   CL 102 08/12/2015 0501   CO2 25 08/12/2015 0501   GLUCOSE 70 08/12/2015 0501   BUN 11 08/12/2015 0501   CREATININE 1.19* 08/12/2015 0501   CALCIUM 8.3* 08/12/2015 0501   GFRNONAA 47* 08/12/2015 0501   GFRAA 54* 08/12/2015 0501    Assessment/Planning: Left foot gangrene with severe PAD.  S/p intervention to improve perfusion last week    Foot appears to be demarcating in the mid foot.  Not sure there will be enough foot to be salvageable, but only time will tell.  Will likely need BKA eventually, but if stable and pain is tolerable, can go home and have IV ABx and follow up with me and Dr. Elvina Mattes in the clinic in 1-2 weeks in follow up to reassess the foot.    Will need home health for daily dressing changes.  Avoid pressure to both feet as much as possible      Lennyx Verdell  08/12/2015, 4:17 PM

## 2015-08-12 NOTE — Care Management (Signed)
Discussed case again with Dr Benjie Karvonen who stated that she had spoken to Dr Lucky Cowboy. Dr Benjie Karvonen asked if patient could go home with IVABX and a PICC and I stated that  this could be arranged but patient would need family available for teaching. Dr Benjie Karvonen stated that Dr Lucky Cowboy was under the impression that patient did not want amputation at this time.   I expressed my concerns of this plan.  To room to speak with patient after speaking with attending. Patient was pleasant and alert, oriented. I asked patient directly if she did not want her foot amputated.  Pateint stated to me that she was OK with having her foot amputated but that she did not want  A "half foot amputation". As she had discussed previously. I then called Dr Benjie Karvonen again and explained that the patient was agreeable to surgical intervention. Dr Benjie Karvonen stated that Dr dew would be up to see the patient today.  I informed the patient of this. Continue to follow.

## 2015-08-12 NOTE — Consult Note (Signed)
ANTIBIOTIC CONSULT NOTE - INITIAL  Pharmacy Consult for Zosyn Indication: cellulitis/ bacteremia  Allergies  Allergen Reactions  . Codeine Nausea And Vomiting  . Tape Rash    Patient Measurements: Height: 5\' 2"  (157.5 cm) Weight: 138 lb 11.2 oz (62.914 kg) IBW/kg (Calculated) : 50.1 Adjusted Body Weight:   Vital Signs: Temp: 98.4 F (36.9 C) (09/07 0729) Temp Source: Oral (09/07 0729) BP: 108/57 mmHg (09/07 0729) Pulse Rate: 89 (09/07 0729) Intake/Output from previous day: 09/06 0701 - 09/07 0700 In: 700 [P.O.:600; IV Piggyback:100] Out: 2250 [Urine:2250] Intake/Output from this shift: Total I/O In: 240 [P.O.:240] Out: 500 [Urine:500]  Labs:  Recent Labs  08/10/15 0443 08/11/15 0509 08/12/15 0501  WBC 18.1* 18.0* 16.9*  HGB 7.9* 8.4* 7.8*  PLT 448* 492* 463*  CREATININE  --  1.22* 1.19*   Estimated Creatinine Clearance: 40.5 mL/min (by C-G formula based on Cr of 1.19). No results for input(s): VANCOTROUGH, VANCOPEAK, VANCORANDOM, GENTTROUGH, GENTPEAK, GENTRANDOM, TOBRATROUGH, TOBRAPEAK, TOBRARND, AMIKACINPEAK, AMIKACINTROU, AMIKACIN in the last 72 hours.   Microbiology: Recent Results (from the past 720 hour(s))  Culture, blood (routine x 2)     Status: None   Collection Time: 08/03/15  5:12 PM  Result Value Ref Range Status   Specimen Description BLOOD RIGHT ARM  Final   Special Requests BOTTLES DRAWN AEROBIC AND ANAEROBIC [place]  Final   Culture NO GROWTH 5 DAYS  Final   Report Status 08/08/2015 FINAL  Final  Culture, blood (routine x 2)     Status: None   Collection Time: 08/03/15  5:15 PM  Result Value Ref Range Status   Specimen Description BLOOD LEFT ARM  Final   Special Requests BOTTLES DRAWN AEROBIC AND ANAEROBIC 4CC  Final   Culture  Setup Time   Final    GRAM POSITIVE COCCI IN PAIRS IN CHAINS AEROBIC BOTTLE ONLY CRITICAL RESULT CALLED TO, READ BACK BY AND VERIFIED WITH: MICHAEL JACOBS 08/04/15 1320 JGF    Culture   Final    GROUP B  STREP(S.AGALACTIAE)ISOLATED STREPTOCOCCUS SANGUINIS AEROBIC BOTTLE ONLY Virtually 100% of S. agalactiae (Group B) strains are susceptible to Penicillin.  For Penicillin-allergic patients, Erythromycin (85-95% sensitive) and Clindamycin (80% sensitive) are drugs of choice. Contact microbiology lab to request sensitivities if  needed within 7 days. ORG 2 SUGGESTIVE OF CONTAMINATION    Report Status 08/07/2015 FINAL  Final   Organism ID, Bacteria GROUP B STREP(S.AGALACTIAE)ISOLATED  Final      Susceptibility   Group b strep(s.agalactiae)isolated - MIC*    CLINDAMYCIN SENSITIVE Sensitive     CIPROFLOXACIN SENSITIVE Sensitive     VANCOMYCIN SENSITIVE Sensitive     ERYTHROMYCIN SENSITIVE Sensitive     * GROUP B STREP(S.AGALACTIAE)ISOLATED  Urine culture     Status: None   Collection Time: 08/03/15  6:10 PM  Result Value Ref Range Status   Specimen Description URINE, RANDOM  Final   Special Requests NONE  Final   Culture MULTIPLE SPECIES PRESENT, SUGGEST RECOLLECTION  Final   Report Status 08/05/2015 FINAL  Final  Wound culture     Status: None   Collection Time: 08/04/15  6:00 PM  Result Value Ref Range Status   Specimen Description FOOT  Final   Special Requests Immunocompromised  Final   Gram Stain FEW WBC SEEN MODERATE GRAM NEGATIVE RODS   Final   Culture NO GROWTH 4 DAYS  Final   Report Status 08/09/2015 FINAL  Final  C difficile quick scan w PCR reflex  Status: None   Collection Time: 08/07/15 11:38 AM  Result Value Ref Range Status   C Diff antigen NEGATIVE NEGATIVE Final   C Diff toxin NEGATIVE NEGATIVE Final   C Diff interpretation Negative for C. difficile  Final  Culture, blood (routine x 2)     Status: None (Preliminary result)   Collection Time: 08/09/15  9:12 AM  Result Value Ref Range Status   Specimen Description BLOOD RIGHT FATTY CASTS  Final   Special Requests BOTTLES DRAWN AEROBIC AND ANAEROBIC  12CC  Final   Culture NO GROWTH 3 DAYS  Final   Report Status  PENDING  Incomplete  Culture, blood (routine x 2)     Status: None (Preliminary result)   Collection Time: 08/09/15  9:22 AM  Result Value Ref Range Status   Specimen Description BLOOD LEFT ASSIST CONTROL  Final   Special Requests BOTTLES DRAWN AEROBIC AND ANAEROBIC  12CC  Final   Culture NO GROWTH 3 DAYS  Final   Report Status PENDING  Incomplete    Medical History: Past Medical History  Diagnosis Date  . Sleep apnea     cpap  . Dysrhythmia     a fib  . Hypertension   . Neuropathy   . History of hiatal hernia   . Anemia   . Arthritis   . Diabetes mellitus without complication   . HOH (hard of hearing)     aids  . A-fib     Medications:  Scheduled:  . aspirin EC  81 mg Oral BID  . atorvastatin  40 mg Oral QHS  . enoxaparin (LOVENOX) injection  40 mg Subcutaneous Q24H  . feeding supplement  1 Container Oral TID WC  . gabapentin  100 mg Oral TID  . glimepiride  2 mg Oral Q breakfast  . insulin aspart  0-15 Units Subcutaneous TID WC  . insulin aspart  0-5 Units Subcutaneous QHS  . insulin glargine  13 Units Subcutaneous QHS  . omega-3 acid ethyl esters  1 g Oral Daily  . piperacillin-tazobactam (ZOSYN)  IV  3.375 g Intravenous 3 times per day  . pyridOXINE  100 mg Oral QHS  . sotalol  80 mg Oral BID  . vitamin E  400 Units Oral QHS   Assessment: Pt is a 64 year old female with cellulitis. Blood cx is positive for group B strep in 1/2 bc and wound culture mod gram neg rods. Pharmacy consulted to dose zosyn. Pt was started on zosyn 3.375 IV Q8H extended infusion on 9/3. Day 5 of therapy, WBC trending down.   Goal of Therapy:  resolution of infection  Plan:  zosyn 3.375g iv q 8 hours EI infusion  Krista Blue, PharmD Clinical Pharmacist 08/12/2015,10:53 AM

## 2015-08-13 ENCOUNTER — Inpatient Hospital Stay: Payer: Medicare Other

## 2015-08-13 ENCOUNTER — Telehealth: Payer: Self-pay | Admitting: Family Medicine

## 2015-08-13 ENCOUNTER — Inpatient Hospital Stay
Admission: EM | Admit: 2015-08-13 | Discharge: 2015-08-20 | DRG: 854 | Disposition: A | Discharge status: Skilled Nursing Facility | Payer: Medicare Other | Attending: Internal Medicine | Admitting: Internal Medicine

## 2015-08-13 DIAGNOSIS — A401 Sepsis due to streptococcus, group B: Secondary | ICD-10-CM | POA: Diagnosis not present

## 2015-08-13 DIAGNOSIS — I739 Peripheral vascular disease, unspecified: Secondary | ICD-10-CM | POA: Diagnosis present

## 2015-08-13 DIAGNOSIS — Z833 Family history of diabetes mellitus: Secondary | ICD-10-CM

## 2015-08-13 DIAGNOSIS — E860 Dehydration: Secondary | ICD-10-CM | POA: Diagnosis present

## 2015-08-13 DIAGNOSIS — Z888 Allergy status to other drugs, medicaments and biological substances status: Secondary | ICD-10-CM

## 2015-08-13 DIAGNOSIS — I482 Chronic atrial fibrillation: Secondary | ICD-10-CM | POA: Diagnosis present

## 2015-08-13 DIAGNOSIS — Z79899 Other long term (current) drug therapy: Secondary | ICD-10-CM

## 2015-08-13 DIAGNOSIS — D638 Anemia in other chronic diseases classified elsewhere: Secondary | ICD-10-CM | POA: Diagnosis present

## 2015-08-13 DIAGNOSIS — L97519 Non-pressure chronic ulcer of other part of right foot with unspecified severity: Secondary | ICD-10-CM | POA: Diagnosis present

## 2015-08-13 DIAGNOSIS — Z87891 Personal history of nicotine dependence: Secondary | ICD-10-CM

## 2015-08-13 DIAGNOSIS — Z9842 Cataract extraction status, left eye: Secondary | ICD-10-CM

## 2015-08-13 DIAGNOSIS — Z825 Family history of asthma and other chronic lower respiratory diseases: Secondary | ICD-10-CM

## 2015-08-13 DIAGNOSIS — E1165 Type 2 diabetes mellitus with hyperglycemia: Secondary | ICD-10-CM | POA: Diagnosis present

## 2015-08-13 DIAGNOSIS — H9190 Unspecified hearing loss, unspecified ear: Secondary | ICD-10-CM | POA: Diagnosis present

## 2015-08-13 DIAGNOSIS — Z8249 Family history of ischemic heart disease and other diseases of the circulatory system: Secondary | ICD-10-CM

## 2015-08-13 DIAGNOSIS — Z9861 Coronary angioplasty status: Secondary | ICD-10-CM

## 2015-08-13 DIAGNOSIS — G4730 Sleep apnea, unspecified: Secondary | ICD-10-CM | POA: Diagnosis present

## 2015-08-13 DIAGNOSIS — M869 Osteomyelitis, unspecified: Secondary | ICD-10-CM | POA: Diagnosis not present

## 2015-08-13 DIAGNOSIS — E871 Hypo-osmolality and hyponatremia: Secondary | ICD-10-CM | POA: Diagnosis present

## 2015-08-13 DIAGNOSIS — I10 Essential (primary) hypertension: Secondary | ICD-10-CM | POA: Diagnosis present

## 2015-08-13 DIAGNOSIS — E440 Moderate protein-calorie malnutrition: Secondary | ICD-10-CM

## 2015-08-13 DIAGNOSIS — A419 Sepsis, unspecified organism: Secondary | ICD-10-CM

## 2015-08-13 DIAGNOSIS — Z7982 Long term (current) use of aspirin: Secondary | ICD-10-CM

## 2015-08-13 DIAGNOSIS — E785 Hyperlipidemia, unspecified: Secondary | ICD-10-CM | POA: Diagnosis present

## 2015-08-13 DIAGNOSIS — Z886 Allergy status to analgesic agent status: Secondary | ICD-10-CM

## 2015-08-13 DIAGNOSIS — M1990 Unspecified osteoarthritis, unspecified site: Secondary | ICD-10-CM | POA: Diagnosis present

## 2015-08-13 DIAGNOSIS — Z90710 Acquired absence of both cervix and uterus: Secondary | ICD-10-CM

## 2015-08-13 DIAGNOSIS — Z9889 Other specified postprocedural states: Secondary | ICD-10-CM

## 2015-08-13 DIAGNOSIS — L03116 Cellulitis of left lower limb: Secondary | ICD-10-CM | POA: Diagnosis present

## 2015-08-13 DIAGNOSIS — E1152 Type 2 diabetes mellitus with diabetic peripheral angiopathy with gangrene: Secondary | ICD-10-CM | POA: Diagnosis present

## 2015-08-13 DIAGNOSIS — Z794 Long term (current) use of insulin: Secondary | ICD-10-CM

## 2015-08-13 DIAGNOSIS — Z961 Presence of intraocular lens: Secondary | ICD-10-CM | POA: Diagnosis present

## 2015-08-13 LAB — GLUCOSE, CAPILLARY
GLUCOSE-CAPILLARY: 241 mg/dL — AB (ref 65–99)
Glucose-Capillary: 122 mg/dL — ABNORMAL HIGH (ref 65–99)

## 2015-08-13 MED ORDER — SODIUM CHLORIDE 0.9 % IV BOLUS (SEPSIS)
1000.0000 mL | Freq: Once | INTRAVENOUS | Status: AC
Start: 2015-08-13 — End: 2015-08-14
  Administered 2015-08-14: 1000 mL via INTRAVENOUS

## 2015-08-13 MED ORDER — PIPERACILLIN-TAZOBACTAM 3.375 G IVPB: 3.3750 g | Freq: Three times a day (TID) | INTRAVENOUS | Status: DC

## 2015-08-13 MED ORDER — INSULIN NPH (HUMAN) (ISOPHANE) 100 UNIT/ML ~~LOC~~ SUSP: 6.0000 [IU] | Freq: Two times a day (BID) | SUBCUTANEOUS | Status: AC

## 2015-08-13 NOTE — Care Management (Addendum)
Patient will be discharged to day with follow up with Dr Lucky Cowboy.  Patient does not have rolling walker at home and DME ordered from [facility] for rolling walker to be delivered to room. Patent offered choice of home health providers and chose [facility] due to affiliation with [facility]. Contacted Dr Georgette Shell office to see if protective shoe would be in order. Awaiting call back from Dr Vickki Muff who is on call podiatry today.  Referral placed with Floydene Flock at [facility]. Patient will be discharged with home IVABX. She will have PICC prior to discharge today. She stated to me that she will have Son and her best friend available for teaching.  She will need wound care BID. Discussed care with Floydene Flock at [facility].  Discharge today after PICC placement. Diabetes coordinator consulted with Dr Benjie Karvonen attending for insulin coverage at home. Continue to follow for additional needs. N type insulin script placed on chart($25 [employer]).6 units BID. Zosyn Scirpt given to [employer] at [facility].

## 2015-08-13 NOTE — ED Notes (Signed)
Pt arrives to ED via [facility] d/t fever and diabetic foot pain. Per EMS, pt was d/c'd today around 430 pm after being treated with IV ABX d/t cellulitis. Pt reports having a fever 102 at home, and combined with the foot pain, dcided to come to the ED for evaluation. Pt states that Dr Lucky Cowboy is planning to amputate the left foot at the calf in the near future. Pt states no feeling in the foot, and has been told the pedal pulses are absent. Pt foot is wrapped in Kerlex and placed in a surgical shoe; toes are black and cold.

## 2015-08-13 NOTE — Progress Notes (Signed)
PICC line was placed. X-ray report states it is okay to use.

## 2015-08-13 NOTE — Discharge Instructions (Signed)
Gangrene °Gangrene is the decay or death of an organ or tissue caused by loss of blood supply. It may be caused by infectious or inflammatory processes. Or it may be caused by degenerative changes in long-standing diseases such as diabetes. It may also occur following injuries or surgical procedures. °TYPES OF GANGRENE °There are three major types of gangrene: dry, moist, and gaseous (a type of moist gangrene). °· Dry gangrene is a condition that results when one or more arteries become blocked. Arteries are muscular vessels which carry blood away from the heart and around the body. In this type of gangrene, the tissue slowly dies, due to loss of blood supply. But it does not become infected. The affected area becomes cold and black. It begins to dry out and wither. Eventually it drops off over a period of weeks or months. Dry gangrene is most common in people with advanced blockages of the arteries (arteriosclerosis or hardening of the arteries) resulting from diabetes. °· Moist gangrene occurs in the toes, feet, or legs after a crushing injury. Or it can be a result of some other problem that causes blood flow in an area to suddenly stop. When blood flow stops, bacteria begin to invade the muscle and grow. It multiplies quickly, without the body's immune system stopping it. °· Gaseous gangrene is also called muscle death (myonecrosis). It is a type of moist gangrene commonly caused by a bacterial infection. This is usually caused by a bacteria which can live in an area of little or no oxygen. Once present in tissue, these bacteria produce gases and poisonous toxins as they grow. These bacteria are normally found in the gut, respiratory, and female urinary tract. They often infect thigh amputation wounds commonly in people who have lost control of their bowel functions (incontinence). Gangrene, incontinence, and weakness often are found in patients with diabetes. It is in the amputation stump of diabetic patients  that gaseous gangrene often happens. The most common bacteria to cause gaseous gangrene is clostridia. Other bacteria which cause moist gangrene include streptococcus and staphylococcus. A serious, but rare form of infection with group A streptococci can slow blood flow. If untreated, it can progress to gangrene. This is more commonly called necrotizing fasciitis. This is an infection of the skin and tissues directly beneath the skin. This is sometimes called the "flesh-eating bacteria". It is called this because it can rapidly kill large areas of flesh in the body. It requires immediate surgical treatment or it will result in death. °CAUSES  °· Some illnesses can cause gangrene because the vessels carrying the blood are already diseased. Examples are: °¨ Long-standing illnesses, such as diabetes mellitus or arteriosclerosis. °¨ Other diseases affecting the blood vessels, such as Buerger disease or Raynaud disease. °Other causes of gangrene include:  °· Open bone breaks. °· Burns. °· Injections given under the skin or in a muscle. °· Gangrene may occur following surgery, particularly in individuals with diabetes or other long-term (chronic) disease. °· Gas gangrene can be also a complication of dry gangrene. Or it can happen suddenly along with an underlying cancer. °Moist gangrene and gas gangrene are different. Gas gangrene usually involves only the muscle. It does not involve the skin as much. In moist or gas gangrene, there is a feeling of heaviness in the affected area that is followed by severe pain. The pain is caused by swelling from fluid or gas accumulation in the tissues. This pain is the worst, on average, between 1 and 4 days   following the injury. It can range from several hours to several weeks. The swollen skin may at first be blistered, red, and warm to the touch. Then it changes to a bronze, brown, or black color. In the majority of cases, the affected and surrounding tissues may give off crackling  sounds (crepitus). This is a result of gas bubbles accumulating under the skin. The gas may be felt beneath the skin. In wet gangrene, the pus is foul-smelling. In gas gangrene, there is no true pus, just an almost "sweet" smelling watery discharge. If the bacterial toxins spread to the bloodstream, the following may happen:  A higher than normal temperature.  Fast heart rate and breathing.  Changed mental state.  Loss of appetite.  Diarrhea.  Vomiting.  Shock. SYMPTOMS  Areas of either dry or moist gangrene are first noticed as a red line on the skin that marks the border of the affected tissues.  As tissues begin to die, dry gangrene may cause pain in the early stages. Or it may go unnoticed.  This happens especially in the elderly or in individuals with decreased ability to feel pain.  At first, the area becomes cold, numb, and pale. Later its color changes to brown, then black. This dead tissue will gradually separate from the healthy tissue and fall off. DIAGNOSIS  Diagnosis of gangrene is based on patient history, physical examination, and results of blood and other lab tests.  A caregiver will look for a history of:  Recent damage caused by an accident (trauma).  Surgery.  Cancer.  Chronic disease.  Blood tests can be used to find out if infection is present and find how much it has spread.  Drainage from a wound, or a tissue sample obtained through surgical exploration, may be cultured to:  Identify the bacteria causing the infection.  Help in figuring out which antibiotic will be most effective.  Gas accumulation and muscle death (myonecrosis) may not be visible. So X-ray studies and more sophisticated imaging techniques may be helpful in making a diagnosis. They include:  Computed tomography (CT) scans.  Magnetic resonance imaging (MRI).  These techniques are not sufficient alone to provide an accurate diagnosis of gangrene, though.  Precise diagnosis  of gas gangrene often requires surgical exploration of the wound.  During such a procedure, the exposed muscle may appear pale, beefy-red. In the most advanced stages, it may be black. If infected, the muscle will fail to contract with stimulation, and the cut surface will not bleed. TREATMENT  Gaseous gangrene is a medical emergency. It requires immediate surgery and antibiotics. If the infection spreads to the bloodstream and infects vital organs, it can rapidly result in death.   Areas of dry gangrene that remain free from infection (aseptic) in the arms or hands and legs or feet (extremities) are often left to wither and fall off. Treatments applied to the wound on the surface are generally not effective without enough blood supply to support wound healing.  Evaluation by a vascular surgeon, along with X-rays to determine blood supply and circulation to the affected area, can help figure out if surgery would be helpful.  Once the cause of infection is found, moist gangrene requires immediate intravenous, intramuscular, or topical broad-spectrum antibiotic therapy. Also, the infected tissue must be removed surgically. Amputation of the affected limb may be needed. Pain medicines (analgesics) are prescribed for pain. Fluids injected into the veins (intravenous fluids) and, occasionally, blood transfusions are used to treat shock and replenish red  blood cells and electrolytes. Plenty of water and healthy foods are needed for wound healing.  Some cases of gangrene are treated by giving oxygen under pressure greater than that of the atmosphere (hyperbaric) to the patient. This happens in a specially designed chamber. The idea behind using hyperbaric oxygen is that more oxygen will become dissolved in the patient's bloodstream. So more oxygen will be delivered to the gangrenous areas. By providing the right amount of oxygenation, the body's ability to fight off the bacterial infection is believed to be  improved. There is a direct harmful effect on the bacteria that thrive in an oxygen-free environment. Some studies have shown that the use of hyperbaric oxygen relieves pain, reduces the number of amputations required, and reduces the extent of surgical debridement (removal of dead or damaged tissue).  The emotional needs of the patient must also be met. The individual with gangrene should be offered moral support, along with an opportunity to share questions and concerns. In addition, particularly in cases where amputation was required; physical, vocational, and rehabilitation therapy will also be required. Except in cases where the infection has spread to the bloodstream, the result of treated gangrene is usually favorable.  PREVENTION  Patients with diabetes or severe arteriosclerosis should take special care of their hands and feet. This is due to the risk of infection associated with even minor injuries. Education about good foot care is important. Poor blood flow as a result diseased blood vessels will lessen the body's defenses against invading germs. Your caregiver will take steps to improve circulation whenever possible. All injuries to skin or tissue should be cared for immediately. Dying or infected skin should be removed immediately. This will prevent the spread of bacteria. Wounds into the abdomen should be surgically treated and damage repaired early. This should be followed up with antibiotic treatment. Patients undergoing non-emergency (elective) intestinal surgery may receive preventive antibiotic therapy. Use of antibiotics prior to and directly following surgery has been shown to significantly reduce the rate of infection.  SEEK IMMEDIATE MEDICAL CARE IF:  You show signs of gaseous gangrene. MAKE SURE YOU:   Understand these instructions.  Will watch your condition.  Will get help right away if you are not doing well or get worse. Document Released: 09/22/2004 Document Revised:  05/22/2012 Document Reviewed: 01/15/2014 [facility] Patient Information 2015 [place], [place]. This information is not intended to replace advice given to you by your health care provider. Make sure you discuss any questions you have with your health care provider. Cellulitis Cellulitis is an infection of the skin and the tissue under the skin. The infected area is usually red and tender. This happens most often in the arms and lower legs. HOME CARE   Take your antibiotic medicine as told. Finish the medicine even if you start to feel better.  Keep the infected arm or leg raised (elevated).  Put a warm cloth on the area up to 4 times per day.  Only take medicines as told by your doctor.  Keep all doctor visits as told. GET HELP IF:  You see red streaks on the skin coming from the infected area.  Your red area gets bigger or turns a dark color.  Your bone or joint under the infected area is painful after the skin heals.  Your infection comes back in the same area or different area.  You have a puffy (swollen) bump in the infected area.  You have new symptoms.  You have a fever. GET HELP  RIGHT AWAY IF:   You feel very sleepy.  You throw up (vomit) or have watery poop (diarrhea).  You feel sick and have muscle aches and pains. MAKE SURE YOU:   Understand these instructions.  Will watch your condition.  Will get help right away if you are not doing well or get worse. Document Released: 05/09/2008 Document Revised: 04/07/2014 Document Reviewed: 02/06/2012 [facility] Patient Information 2015 [place], [place]. This information is not intended to replace advice given to you by your health care provider. Make sure you discuss any questions you have with your health care provider.

## 2015-08-13 NOTE — Progress Notes (Signed)
Patient Demographics  Toinette Lackie, is a 64 y.o. female   MRN: [id number]   DOB - 1951/03/30  Admit Date - 08/03/2015    Outpatient Primary MD for the patient is [name], DO          Past Medical History  Diagnosis Date  . Sleep apnea     cpap  . Dysrhythmia     a fib  . Hypertension   . Neuropathy   . History of hiatal hernia   . Anemia   . Arthritis   . Diabetes mellitus without complication   . HOH (hard of hearing)     aids  . A-fib       Past Surgical History  Procedure Laterality Date  . Abdominal hysterectomy    . Oophorectomy    . Cardioversion    . Endovenous ablation saphenous vein w/ laser    . Cataract extraction w/phaco Left 07/16/2015    Procedure: CATARACT EXTRACTION PHACO AND INTRAOCULAR LENS PLACEMENT (IOC);  Surgeon: Lyla Glassing, MD;  Location: [facility] ORS;  Service: Ophthalmology;  Laterality: Left;  [place]: 01:16.4 AP%: 12.7 CDE: 9.68 Fluid lot# [id number]  . Peripheral vascular catheterization Left 08/05/2015    Procedure: Lower Extremity Angiography;  Surgeon: Algernon Huxley, MD;  Location: [place] [facility];  Service: Cardiovascular;  Laterality: Left;  . Peripheral vascular catheterization  08/05/2015    Procedure: Lower Extremity Intervention;  Surgeon: Algernon Huxley, MD;  Location: [place] [facility];  Service: Cardiovascular;;    in for   Chief Complaint  Patient presents with  . Blood Infection   Gangrenous changes to left foot with cellulitis to left LE.  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 HPI  [name]  is a 64 y.o. female, and hospitalized for approximately week with cellulitis to the left lower extremity with gangrenous changes. Angiogram was attempted last week with some success but still significant vascular changes to left foot have him  continued.    Review of Systems    In addition to the HPI above,  No Fever-chills, No Headache, No changes with Vision or hearing, No problems swallowing food or Liquids, No Chest pain, Cough or Shortness of Breath, No Abdominal pain, No Nausea or Vommitting, Bowel movements are regular, No Blood in stool or Urine, No dysuria, No new skin rashes or bruises, No new joints pains-aches,  No new weakness, tingling, numbness in any extremity, No recent weight gain or loss, No polyuria, polydypsia or polyphagia, No significant Mental Stressors.  A full 10 point Review of Systems was done, except as stated above, all other Review of Systems were negative.   Social History Social History  Substance Use Topics  . Smoking status: Former Smoker -- 1.00 packs/day for 40 years    Types: Cigarettes  . Smokeless tobacco: Not on file  . Alcohol Use: 0.6 oz/week    1 Glasses of wine per week     Comment: occas     Family History Family History  Problem Relation Age of Onset  . Diabetes Mother   . COPD Mother   . Heart disease Mother   . Diabetes Father   . Heart disease Father     Prior to Admission medications   Medication Sig Start Date End Date Taking? Authorizing Provider  acetaminophen (TYLENOL) 500 MG tablet Take 1,000 mg by mouth every 6 (six) hours as needed for mild pain or headache.   Yes Historical Provider, MD  aspirin EC 81 MG tablet Take 81 mg by mouth 2 (two) times daily.   Yes Historical Provider, MD  atorvastatin (LIPITOR) 40 MG tablet Take 40 mg by mouth at bedtime.   Yes Historical Provider, MD  diphenhydrAMINE (BENADRYL) 25 MG tablet Take 25 mg by mouth at bedtime as needed for sleep.   Yes Historical Provider, MD  gabapentin (NEURONTIN) 100 MG capsule Take 100 mg by mouth 3 (three) times daily.    Yes Historical Provider, MD  glimepiride (AMARYL) 2 MG tablet Take 2 mg by mouth daily with breakfast.   Yes Historical Provider, MD  ibuprofen (ADVIL,MOTRIN) 200  MG tablet Take 400 mg by mouth every 6 (six) hours as needed for headache or mild pain.   Yes Historical Provider, MD  metFORMIN (GLUCOPHAGE) 500 MG tablet Take 500 mg by mouth 2 (two) times daily with a meal.   Yes Historical Provider, MD  Omega-3 Fatty Acids (FISH OIL) 1200 MG CAPS Take 1,200 mg by mouth 2 (two) times daily.   Yes Historical Provider, MD  pyridOXINE (VITAMIN B-6) 100 MG tablet Take 100 mg by mouth at bedtime.    Yes Historical Provider, MD  sotalol (BETAPACE) 80 MG tablet Take 80 mg by mouth 2 (two) times daily.    Yes Historical Provider, MD  vitamin E 400 UNIT capsule Take 400 Units by mouth at bedtime.    Yes Historical Provider, MD  insulin NPH Human (HUMULIN N) 100 UNIT/ML injection Inject 0.06 mLs (6 Units total) into the skin 2 (two) times daily before a meal. 08/13/15   Sital Mody, MD  piperacillin-tazobactam (ZOSYN) 3.375 GM/50ML IVPB Inject 50 mLs (3.375 g total) into the vein every 8 (eight) hours. 08/13/15 08/31/15  Bettey Costa, MD    Anti-infectives    Start     Dose/Rate Route Frequency Ordered Stop   08/13/15 0000  piperacillin-tazobactam (ZOSYN) 3.375 GM/50ML IVPB     3.375 g 12.5 mL/hr over 240 Minutes Intravenous Every 8 hours 08/13/15 0958 08/31/15 2359   08/09/15 2030  vancomycin (VANCOCIN) IVPB 1000 mg/200 mL premix  Status:  Discontinued     1,000 mg 200 mL/hr over 60 Minutes Intravenous Every 24 hours 08/09/15 1313 08/11/15 1532   08/09/15 1315  vancomycin (VANCOCIN) IVPB 1000 mg/200 mL premix     1,000 mg 200 mL/hr over 60 Minutes Intravenous  Once 08/09/15 1313 08/09/15 1821   08/08/15 1100  piperacillin-tazobactam (ZOSYN) IVPB 3.375 g     3.375 g 12.5 mL/hr over 240 Minutes Intravenous 3 times per day 08/08/15 1046     08/04/15 0900  vancomycin (VANCOCIN) IVPB 1000 mg/200 mL premix  Status:  Discontinued     1,000 mg 200 mL/hr over 60 Minutes Intravenous Every 18 hours 08/03/15 2325 08/07/15 1119   08/03/15 2330  vancomycin (VANCOCIN) IVPB 1000 mg/200  mL premix     1,000 mg 200 mL/hr over 60 Minutes Intravenous  Once 08/03/15 2325 08/04/15 0150   08/03/15 1915  piperacillin-tazobactam (ZOSYN) IVPB 3.375 g  3.375 g 12.5 mL/hr over 240 Minutes Intravenous  Once 08/03/15 1902 08/03/15 2315      Scheduled Meds: . aspirin EC  81 mg Oral BID  . atorvastatin  40 mg Oral QHS  . enoxaparin (LOVENOX) injection  40 mg Subcutaneous Q24H  . feeding supplement  1 Container Oral TID WC  . gabapentin  100 mg Oral TID  . glimepiride  2 mg Oral Q breakfast  . insulin aspart  0-15 Units Subcutaneous TID WC  . insulin aspart  0-5 Units Subcutaneous QHS  . insulin glargine  13 Units Subcutaneous QHS  . omega-3 acid ethyl esters  1 g Oral Daily  . piperacillin-tazobactam (ZOSYN)  IV  3.375 g Intravenous 3 times per day  . pyridOXINE  100 mg Oral QHS  . sotalol  80 mg Oral BID  . vitamin E  400 Units Oral QHS   Continuous Infusions:  PRN Meds:.acetaminophen **OR** acetaminophen, acetaminophen, ibuprofen, ondansetron **OR** ondansetron (ZOFRAN) IV, zolpidem  Allergies  Allergen Reactions  . Codeine Nausea And Vomiting  . Tape Rash    Physical Exam  Vitals  Blood pressure 116/68, pulse 88, temperature 97.7 F (36.5 C), temperature source Oral, resp. rate 18, height 5\' 2"  (1.575 m), weight 62.914 kg (138 lb 11.2 oz), SpO2 98 %.  Lower Extremity exam:  Vascular: DP and PT pulses are nonpalpable bilaterally. Left lower extremity has significant cyanosis discoloration to the toes all went back to the mid foot both dorsally and plantarly. A large swath necrotic tissue is noted dorsally with likely deep damage to the tissues. Overall cellulitis is much improved.  Dermatological: Diabetic ulceration in the third and intertriginous zone to the right foot. This wound looks better was no sign of infections shows gradual improvement enclosure still open with about 3-4 mm width and 5 mm of depth to the area but appears to be  granulating.  Neurological: Significant peripheral neuropathy bilaterally   Data Review  CBC  Recent Labs Lab 08/07/15 0436 08/08/15 0448 08/09/15 0506 08/10/15 0443 08/11/15 0509 08/12/15 0501  WBC 20.8* 19.3* 20.1* 18.1* 18.0* 16.9*  HGB 8.7* 8.6* 8.0* 7.9* 8.4* 7.8*  HCT 26.9* 26.6* 24.1* 24.4* 25.7* 23.5*  PLT 385 400 392 448* 492* 463*  MCV 81.2 79.8* 80.1 80.1 79.7* 80.3  MCH 26.3 25.8* 26.6 25.9* 26.2 26.5  MCHC 32.4 32.4 33.2 32.3 32.9 33.0  RDW 14.0 14.0 13.8 14.0 13.8 13.7  LYMPHSABS 3.8* 3.8*  --   --   --   --   MONOABS 1.3* 1.4*  --   --   --   --   EOSABS 0.1 0.1  --   --   --   --   BASOSABS 0.1 0.1  --   --   --   --    ------------------------------------------------------------------------------------------------------------------  Chemistries   Recent Labs Lab 08/08/15 0448 08/09/15 0506 08/11/15 0509 08/12/15 0501  NA 134* 134* 133* 135  K 3.8 3.8 4.3 3.9  CL 105 102 101 102  CO2 25 25 26 25   GLUCOSE 92 94 151* 70  BUN 13 12 13 11   CREATININE 1.21* 1.22* 1.22* 1.19*  CALCIUM 7.9* 8.0* 8.3* 8.3*   ------------------------------------------------------------------------------------------------------------------ estimated creatinine clearance is 40.5 mL/min (by C-G formula based on Cr of 1.19). ------------------------------------------------------------------------------------------------------------------ No results for input(s): TSH, T4TOTAL, T3FREE, THYROIDAB in the last 72 hours.  Invalid input(s): FREET3   Coagulation profile No results for input(s): INR, PROTIME in the last 168 hours. ------------------------------------------------------------------------------------------------------------------- No results  for input(s): DDIMER in the last 72 hours. -------------------------------------------------------------------------------------------------------------------  Cardiac Enzymes No results for input(s): CKMB, TROPONINI,  MYOGLOBIN in the last 168 hours.  Invalid input(s): CK ------------------------------------------------------------------------------------------------------------------ Invalid input(s): POCBNP   ---------------------------------------------------------------------------------------------------------------  Urinalysis    Component Value Date/Time   COLORURINE YELLOW* 08/09/2015 2334   APPEARANCEUR CLEAR* 08/09/2015 2334   LABSPEC 1.008 08/09/2015 2334   PHURINE 5.0 08/09/2015 2334   GLUCOSEU NEGATIVE 08/09/2015 2334   HGBUR NEGATIVE 08/09/2015 2334   [place] NEGATIVE 08/09/2015 2334   [place] NEGATIVE 08/09/2015 2334   PROTEINUR NEGATIVE 08/09/2015 2334   NITRITE NEGATIVE 08/09/2015 2334   LEUKOCYTESUR NEGATIVE 08/09/2015 2334    Assessment & Plan: I have discussed her condition with her. She understands she has a very little chance of salvage to the foot or in part of it on that left lower extremity. I explained her options to include waiting for further demarcation and then trying a flap the plantar aspect but because there is such significant damage to plantar area would be hard to get enough tissue to cover that region. She expressed the fact that she does not want to try and do something that has little chance of success. She states she would rather have a below-knee amputation of the fitted with a prosthetic. I believe this is also the best course of action. On her right foot the color to the toes still looks good and the wound shows improvement. Plan: Redressed the areas today. Recommend continued wet-to-dry saline dressings to both feet. Dr. do is going to follow her again next Friday in his office. I will follow her approximately week and a half and evaluate her for primarily the right foot ulcer. More than likely she will proceed with the BKA on the left. I will order postop shoes for both feet today. I will call her son and discussed this with him.  Active Problems:    Cellulitis   Malnutrition of moderate degree    Tonda Wiederhold G M.D on 08/13/2015 at 12:54 PM  Thank you for the consult, we will follow the patient with you in the Hospital.

## 2015-08-13 NOTE — Telephone Encounter (Signed)
[facility]     Patient Name: [facility] Client [facility] - Client    Client Site [place] - Day    Contact Type Call    Call Type Triage / Clinical  Gender: Female Caller Name Jackelyn Poling   DOB: 1951/09/05  Relationship To Patient Provider  Age: 64 Y 7 D Return Phone Number [phone] (Primary)  Return Phone Number: [phone] (Primary) Chief Complaint Fever (non urgent symptom) (> THREE MONTHS)  Address:  Initial Comment Caller states she is Debbie with [place] calling in regards to [employer]. Her DOB Apr 22, 1951. She was released from the hospital today and now has a fever of 101.9 oral and having chills. She has a picc line. She has Gangrene in her left foot.   City/State/Zip: [place]  PreDisposition InappropriateToAsk    Nurse Assessment  Nurse: Amalia Hailey, RN, Melissa Date/Time (Eastern Time): 08/13/2015 10:41:08 PM  Confirm and document reason for call. If symptomatic, describe symptoms. ---Caller states she is Debbie with [place] calling in regards to [employer]. Her DOB [birth year]. She was released from the hospital today and now has a fever of 101.9 oral and having chills. She has a picc line. She has Gangrene in her left foot.  Has the patient traveled out of the country within the last 30 days? ---Not Applicable  Does the patient require triage? ---Yes  Related visit to physician within the last 2 weeks? ---Yes  Does the PT have any chronic conditions? (i.e. diabetes, asthma, etc.) ---Yes  List chronic conditions. ---Diabetic-Insulin, Chronic A-Fib, PAD, Hyponatremia, Hypocholemic, malnutrition mod. degree,    Guidelines      Guideline Title Affirmed Question Affirmed Notes Nurse Date/Time (Eastern Time)  Fever Difficult to awaken or acting confused (e.g., disoriented, slurred speech)  Amalia Hailey, RN, Melissa 08/13/2015 10:43:51 PM  Disp. Time  Eilene Ghazi Time) Disposition Final User   08/13/2015 10:51:32 PM 911 Outcome Documentation  Amalia Hailey, RN, Melissa    Reason: called the caller back and reports she has called the ambulance. Transporting to the hospital.     08/13/2015 10:46:32 PM Call EMS 911 Now Yes Amalia Hailey, RN, Lenna Sciara         Caller Understands: Yes   Disagree/Comply: Comply      Care Advice Given Per Guideline         CALL EMS 911 NOW: Immediate medical attention is needed. You need to hang up and call 911 (or an ambulance). Psychologist, forensic Discretion: I'll call you back in a few minutes to be sure you were able to reach them.) CARE ADVICE given per Fever (Adult) guideline.   After Care Instructions Given     Call Event Type User Date / Time Description

## 2015-08-13 NOTE — ED Provider Notes (Signed)
[facility] Emergency Department Provider Note  ____________________________________________  Time seen: 11:20 PM  I have reviewed the triage vital signs and the nursing notes.   HISTORY  Chief Complaint Fever      HPI [name] is a 64 y.o. female presents with fever at home MAXIMUM TEMPERATURE 102. Of note patient has a plan for a left leg amputation and 2 weeks scheduled to be performed by Dr. dew. Patient is an 11 day inpatient hospital stay secondary to left leg osteomyelitis and ischemia. Patient has multiple toes on the left foot with cyanosis. And erythema extending up to the mid calf. Patient states that she went home with a PICC line in place and is receiving IV antibiotics but however spiked a temperature today.     Past Medical History  Diagnosis Date  . Sleep apnea     cpap  . Dysrhythmia     a fib  . Hypertension   . Neuropathy   . History of hiatal hernia   . Anemia   . Arthritis   . Diabetes mellitus without complication   . HOH (hard of hearing)     aids  . A-fib     Patient Active Problem List   Diagnosis Date Noted  . Malnutrition of moderate degree 08/04/2015  . Cellulitis 08/03/2015    Past Surgical History  Procedure Laterality Date  . Abdominal hysterectomy    . Oophorectomy    . Cardioversion    . Endovenous ablation saphenous vein w/ laser    . Cataract extraction w/phaco Left 07/16/2015    Procedure: CATARACT EXTRACTION PHACO AND INTRAOCULAR LENS PLACEMENT (IOC);  Surgeon: Lyla Glassing, MD;  Location: [facility] ORS;  Service: Ophthalmology;  Laterality: Left;  [place]: 01:16.4 AP%: 12.7 CDE: 9.68 Fluid lot# [id number]  . Peripheral vascular catheterization Left 08/05/2015    Procedure: Lower Extremity Angiography;  Surgeon: Algernon Huxley, MD;  Location: [place] [facility];  Service: Cardiovascular;  Laterality: Left;  . Peripheral vascular catheterization  08/05/2015    Procedure: Lower Extremity Intervention;   Surgeon: Algernon Huxley, MD;  Location: [place] [facility];  Service: Cardiovascular;;    Current Outpatient Rx  Name  Route  Sig  Dispense  Refill  . acetaminophen (TYLENOL) 500 MG tablet   Oral   Take 1,000 mg by mouth every 6 (six) hours as needed for mild pain or headache.         Mar[place] Kitchen aspirin EC 81 MG tablet   Oral   Take 81 mg by mouth 2 (two) times daily.         Marland Kitchen atorvastatin (LIPITOR) 40 MG tablet   Oral   Take 40 mg by mouth at bedtime.         . diphenhydrAMINE (BENADRYL) 25 MG tablet   Oral   Take 25 mg by mouth at bedtime as needed for sleep.         Marland Kitchen gabapentin (NEURONTIN) 100 MG capsule   Oral   Take 100 mg by mouth 3 (three) times daily.          Marland Kitchen glimepiride (AMARYL) 2 MG tablet   Oral   Take 2 mg by mouth daily with breakfast.         . ibuprofen (ADVIL,MOTRIN) 200 MG tablet   Oral   Take 400 mg by mouth every 6 (six) hours as needed for headache or mild pain.         Marland Kitchen insulin  NPH Human (HUMULIN N) 100 UNIT/ML injection   Subcutaneous   Inject 0.06 mLs (6 Units total) into the skin 2 (two) times daily before a meal.   10 mL   11   . metFORMIN (GLUCOPHAGE) 500 MG tablet   Oral   Take 500 mg by mouth 2 (two) times daily with a meal.         . Omega-3 Fatty Acids (FISH OIL) 1200 MG CAPS   Oral   Take 1,200 mg by mouth 2 (two) times daily.         . piperacillin-tazobactam (ZOSYN) 3.375 GM/50ML IVPB   Intravenous   Inject 50 mLs (3.375 g total) into the vein every 8 (eight) hours.   50 mL   0   . pyridOXINE (VITAMIN B-6) 100 MG tablet   Oral   Take 100 mg by mouth at bedtime.          . sotalol (BETAPACE) 80 MG tablet   Oral   Take 80 mg by mouth 2 (two) times daily.          . vitamin E 400 UNIT capsule   Oral   Take 400 Units by mouth at bedtime.            Allergies Codeine and Tape  Family History  Problem Relation Age of Onset  . Diabetes Mother   . COPD Mother   . Heart disease Mother   . Diabetes  Father   . Heart disease Father     Social History Social History  Substance Use Topics  . Smoking status: Former Smoker -- 1.00 packs/day for 40 years    Types: Cigarettes  . Smokeless tobacco: Not on file  . Alcohol Use: 0.6 oz/week    1 Glasses of wine per week     Comment: occas    Review of Systems  Constitutional: Positive for fever. Eyes: Negative for visual changes. ENT: Negative for sore throat. Cardiovascular: Negative for chest pain. Respiratory: Negative for shortness of breath. Gastrointestinal: Negative for abdominal pain, vomiting and diarrhea. Genitourinary: Negative for dysuria. Musculoskeletal: Negative for back pain. Skin: As a redness to the left foot Neurological: Negative for headaches, focal weakness or numbness.   10-point ROS otherwise negative.  ____________________________________________   PHYSICAL EXAM:  VITAL SIGNS: ED Triage Vitals  Enc Vitals Group     BP --      Pulse --      Resp --      Temp --      Temp src --      SpO2 08/13/15 2327 96 %     Weight --      Height --      Head Cir --      Peak Flow --      Pain Score --      Pain Loc --      Pain Edu? --      Excl. in [place]? --     Constitutional: Alert and oriented. Well appearing and in no distress. Eyes: Conjunctivae are normal. PERRL. Normal extraocular movements. ENT   Head: Normocephalic and atraumatic.   Nose: No congestion/rhinnorhea.   Mouth/Throat: Mucous membranes are moist.   Neck: No stridor. Hematological/Lymphatic/Immunilogical: No cervical lymphadenopathy. Cardiovascular: Normal rate, regular rhythm. Normal and symmetric distal pulses are present in all extremities. No murmurs, rubs, or gallops. Respiratory: Normal respiratory effort without tachypnea nor retractions. Breath sounds are clear and equal bilaterally. No wheezes/rales/rhonchi. Gastrointestinal: Soft and nontender.  No distention. There is no CVA tenderness. Genitourinary:  deferred Musculoskeletal: Nontender with normal range of motion in all extremities. No joint effusions.  No lower extremity tenderness nor edema. Neurologic:  Normal speech and language. No gross focal neurologic deficits are appreciated. Speech is normal.  Skin: Cyanosis noted all of the toes on the left foot with erythema extending up to the mid calf. Psychiatric: Mood and affect are normal. Speech and behavior are normal. Patient exhibits appropriate insight and judgment.  ____________________________________________    LABS (pertinent positives/negatives)  Labs Reviewed  CBC - Abnormal; Notable for the following:    WBC 20.6 (*)    RBC 3.05 (*)    Hemoglobin 7.7 (*)    HCT 24.1 (*)    MCV 79.0 (*)    MCH 25.4 (*)    Platelets 525 (*)    All other components within normal limits  COMPREHENSIVE METABOLIC PANEL - Abnormal; Notable for the following:    Sodium 130 (*)    Chloride 97 (*)    Glucose, Bld 178 (*)    Creatinine, Ser 1.19 (*)    Calcium 8.1 (*)    Albumin 2.0 (*)    GFR calc non Af Amer 47 (*)    GFR calc Af Amer 54 (*)    All other components within normal limits  CULTURE, BLOOD (ROUTINE X 2)  CULTURE, BLOOD (ROUTINE X 2)  LACTIC ACID, PLASMA  LACTIC ACID, PLASMA   Critical Care:CRITICAL CARE Performed by: Marjean Donna N   Total critical care time: 60 minutes  Critical care time was exclusive of separately billable procedures and treating other patients.  Critical care was necessary to treat or prevent imminent or life-threatening deterioration.  Critical care was time spent personally by me on the following activities: development of treatment plan with patient and/or surrogate as well as nursing, discussions with consultants, evaluation of patient's response to treatment, examination of patient, obtaining history from patient or surrogate, ordering and performing treatments and interventions, ordering and review of laboratory studies, ordering and  review of radiographic studies, pulse oximetry and re-evaluation of patient's condition.      INITIAL IMPRESSION / ASSESSMENT AND PLAN / ED COURSE  Pertinent labs & imaging results that were available during my care of the patient were reviewed by me and considered in my medical decision making (see chart for details).  Esther physical exam concerning for osteomyelitis with sepsis. Patient has a white blood cell count of 20 tachycardic and febrile. Patient receiving Zosyn at home per documentation as such IV vancomycin given patient received 30 mL per kilogram of normal saline. She'll be admitted to the hospital so she was discussed with Dr. Lavetta Nielsen.  ____________________________________________   FINAL CLINICAL IMPRESSION(S) / ED DIAGNOSES  Final diagnoses:  Sepsis, due to unspecified organism  Osteomyelitis of left foot      Gregor Hams, MD 08/14/15 [phone]

## 2015-08-13 NOTE — Discharge Summary (Signed)
[facility] NAME: Amantha Sklar    MR#:  [id number]  DATE OF BIRTH:  02-27-1951  DATE OF ADMISSION:  08/03/2015 ADMITTING PHYSICIAN: Henreitta Leber, MD  DATE OF DISCHARGE:08/13/2015  PRIMARY CARE PHYSICIAN: [name], DO    ADMISSION DIAGNOSIS:  Cellulitis of left lower extremity [W09.811]  DISCHARGE DIAGNOSIS:  Active Problems:   Cellulitis   Malnutrition of moderate degree   SECONDARY DIAGNOSIS:   Past Medical History  Diagnosis Date  . Sleep apnea     cpap  . Dysrhythmia     a fib  . Hypertension   . Neuropathy   . History of hiatal hernia   . Anemia   . Arthritis   . Diabetes mellitus without complication   . HOH (hard of hearing)     aids  . A-fib     HOSPITAL COURSE:  64 year old female with peripheral arterial disease status post PCI for left lower extremity who presented with left foot cellulitis with gangrene and sepsis.  1. Sepsis: Patient has group B strep in blood culture with left foot gangrene. She was started on IV vancomycin and Zosyn. Vancomycin was discontinued. Patient will remain on Zosyn primarily due to increased risk of sepsis with her left gangrenous foot.   2. Left foot gangrene: Patient is status post left lower extremity angioplasty. She was treated with broad-spectrum antibiotics including IV vancomycin and Zosyn. Podiatry and after surgery saw the patient consultation. Patient does not want amputation which has been recommended by podiatry at this time. Vascular feels that patient may benefit from one to 2 weeks trial of IV antibiotics. She will likely need amputation. I appreciate infectious disease consultation as well. Patient will have a PICC line placed and be on IV Zosyn with outpatient follow-up with vascular surgery and infectious disease.   3. Peripheral arterial disease: Status post PCI. Continue aspirin and statin  4. Hyponatremia/hypokalemia: Repleted and  improved  5. Type 2 diabetes: Patient will continue on Amaryl and NPH 6 units twice a day. Patient will need a follow-up with her PCP.  6. Chronic atrial fibrillation: Patient's heart rate is controlled.    DISCHARGE CONDITIONS AND DIET:  Patient's being discharged home with home health in stable condition on a heart healthy diet/diabetic diet  CONSULTS OBTAINED:  Treatment Team:  Albertine Patricia, DPM Adrian Prows, MD Algernon Huxley, MD  DRUG ALLERGIES:   Allergies  Allergen Reactions  . Codeine Nausea And Vomiting  . Tape Rash    DISCHARGE MEDICATIONS:   Current Discharge Medication List    START taking these medications   Details  insulin NPH Human (HUMULIN N) 100 UNIT/ML injection Inject 0.06 mLs (6 Units total) into the skin 2 (two) times daily before a meal. Qty: 10 mL, Refills: 11    piperacillin-tazobactam (ZOSYN) 3.375 GM/50ML IVPB Inject 50 mLs (3.375 g total) into the vein every 8 (eight) hours. Qty: 50 mL, Refills: 0      CONTINUE these medications which have NOT CHANGED   Details  acetaminophen (TYLENOL) 500 MG tablet Take 1,000 mg by mouth every 6 (six) hours as needed for mild pain or headache.    aspirin EC 81 MG tablet Take 81 mg by mouth 2 (two) times daily.    atorvastatin (LIPITOR) 40 MG tablet Take 40 mg by mouth at bedtime.    diphenhydrAMINE (BENADRYL) 25 MG tablet Take 25 mg by mouth at bedtime as needed for sleep.  gabapentin (NEURONTIN) 100 MG capsule Take 100 mg by mouth 3 (three) times daily.     glimepiride (AMARYL) 2 MG tablet Take 2 mg by mouth daily with breakfast.    ibuprofen (ADVIL,MOTRIN) 200 MG tablet Take 400 mg by mouth every 6 (six) hours as needed for headache or mild pain.    metFORMIN (GLUCOPHAGE) 500 MG tablet Take 500 mg by mouth 2 (two) times daily with a meal.    Omega-3 Fatty Acids (FISH OIL) 1200 MG CAPS Take 1,200 mg by mouth 2 (two) times daily.    pyridOXINE (VITAMIN B-6) 100 MG tablet Take 100 mg by mouth  at bedtime.     sotalol (BETAPACE) 80 MG tablet Take 80 mg by mouth 2 (two) times daily.     vitamin E 400 UNIT capsule Take 400 Units by mouth at bedtime.               Today   CHIEF COMPLAINT:  Patient is doing well this morning. Patient would like to go home. Patient reports no shortness of breath, chest pain or pain.   VITAL SIGNS:  Blood pressure 106/42, pulse 88, temperature 97.7 F (36.5 C), temperature source Oral, resp. rate 18, height 5\' 2"  (1.575 m), weight 62.914 kg (138 lb 11.2 oz), SpO2 98 %.   REVIEW OF SYSTEMS:  Review of Systems  Constitutional: Negative for fever, chills and malaise/fatigue.  HENT: Negative for sore throat.   Eyes: Negative for blurred vision.  Respiratory: Negative for cough, hemoptysis, shortness of breath and wheezing.   Cardiovascular: Negative for chest pain, palpitations and leg swelling.  Gastrointestinal: Negative for nausea, vomiting, abdominal pain, diarrhea and blood in stool.  Genitourinary: Negative for dysuria.  Musculoskeletal: Negative for back pain.  Neurological: Negative for dizziness, tremors and headaches.  Endo/Heme/Allergies: Does not bruise/bleed easily.     PHYSICAL EXAMINATION:  GENERAL:  64 y.o.-year-old patient lying in the bed with no acute distress.  NECK:  Supple, no jugular venous distention. No thyroid enlargement, no tenderness.  LUNGS: Normal breath sounds bilaterally, no wheezing, rales,rhonchi  No use of accessory muscles of respiration.  CARDIOVASCULAR: S1, S2 normal. No murmurs, rubs, or gallops.  ABDOMEN: Soft, non-tender, non-distended. Bowel sounds present. No organomegaly or mass.  EXTREMITIES: No pedal edema, cyanosis, or clubbing.  PSYCHIATRIC: The patient is alert and oriented x 3.  SKIN: Left lower extremity is blue with very poor circulation  DATA REVIEW:   CBC  Recent Labs Lab 08/12/15 0501  WBC 16.9*  HGB 7.8*  HCT 23.5*  PLT 463*    Chemistries   Recent Labs Lab  08/12/15 0501  NA 135  K 3.9  CL 102  CO2 25  GLUCOSE 70  BUN 11  CREATININE 1.19*  CALCIUM 8.3*    Cardiac Enzymes No results for input(s): TROPONINI in the last 168 hours.  Microbiology Results  @MICRORSLT48 @  RADIOLOGY:  No results found.    Management plans discussed with the patient and she is in agreement. Stable for discharge home  Patient should follow up with Dr Lucky Cowboy and Dr Ola Spurr in 1 week  CODE STATUS:     Code Status Orders        Start     Ordered   08/03/15 2256  Full code   Continuous     08/03/15 2256    Advance Directive Documentation        Most Recent Value   Type of Advance Directive  Living will   Pre-existing  out of facility DNR order (yellow form or pink MOST form)     "MOST" Form in Place?        TOTAL TIME TAKING CARE OF THIS PATIENT: 35 minutes.    Finnick Orosz M.D on 08/13/2015 at 11:47 AM  Between 7am to 6pm - Pager - [phone] After 6pm go to [email or web address] - password [email or web address] [facility]  Office  [phone]  CC: Primary care physician; [name], DO

## 2015-08-13 NOTE — Telephone Encounter (Signed)
Received call from home health nurse regarding patient's care. On chart review, it does not appear as if this patient has been seen at the [facility] and is currently a patient of Dr Lacinda Axon at [facility] in [place]. Nurse expressed understanding and stated that she would contact the patient's PCP's office.  [name]. Jerline Pain, [facility] Resident PGY-2 08/13/2015 9:45 PM

## 2015-08-13 NOTE — Care Management (Addendum)
Ok for Home Health to resume IVABX at 0800 tomorrow. Patient will have afternoon dose here prior to discharge.  Patietn stated that she will be able to afford Co pays for IVABX, [name] from [place] to discuss.

## 2015-08-13 NOTE — [place] Notes (Signed)
Infectious Disease Long Term IV Antibiotic Orders  Diagnosis Group B Strep bacteremia, Gangrene L foot Poorly controlled DM, PVD - s/p revasculaization  Culture results Results for orders placed or performed during the hospital encounter of 08/03/15  Culture, blood (routine x 2)     Status: None   Collection Time: 08/03/15  5:12 PM  Result Value Ref Range Status   Specimen Description BLOOD RIGHT ARM  Final   Special Requests BOTTLES DRAWN AEROBIC AND ANAEROBIC [place]  Final   Culture NO GROWTH 5 DAYS  Final   Report Status 08/08/2015 FINAL  Final  Culture, blood (routine x 2)     Status: None   Collection Time: 08/03/15  5:15 PM  Result Value Ref Range Status   Specimen Description BLOOD LEFT ARM  Final   Special Requests BOTTLES DRAWN AEROBIC AND ANAEROBIC 4CC  Final   Culture  Setup Time   Final    GRAM POSITIVE COCCI IN PAIRS IN CHAINS AEROBIC BOTTLE ONLY CRITICAL RESULT CALLED TO, READ BACK BY AND VERIFIED WITH: MICHAEL JACOBS 08/04/15 1320 JGF    Culture   Final    GROUP B STREP(S.AGALACTIAE)ISOLATED STREPTOCOCCUS SANGUINIS AEROBIC BOTTLE ONLY Virtually 100% of S. agalactiae (Group B) strains are susceptible to Penicillin.  For Penicillin-allergic patients, Erythromycin (85-95% sensitive) and Clindamycin (80% sensitive) are drugs of choice. Contact microbiology lab to request sensitivities if  needed within 7 days. ORG 2 SUGGESTIVE OF CONTAMINATION    Report Status 08/07/2015 FINAL  Final   Organism ID, Bacteria GROUP B STREP(S.AGALACTIAE)ISOLATED  Final      Susceptibility   Group b strep(s.agalactiae)isolated - MIC*    CLINDAMYCIN SENSITIVE Sensitive     CIPROFLOXACIN SENSITIVE Sensitive     VANCOMYCIN SENSITIVE Sensitive     ERYTHROMYCIN SENSITIVE Sensitive     * GROUP B STREP(S.AGALACTIAE)ISOLATED  Urine culture     Status: None   Collection Time: 08/03/15  6:10 PM  Result Value Ref Range Status   Specimen Description URINE, RANDOM  Final   Special Requests NONE   Final   Culture MULTIPLE SPECIES PRESENT, SUGGEST RECOLLECTION  Final   Report Status 08/05/2015 FINAL  Final  Wound culture     Status: None   Collection Time: 08/04/15  6:00 PM  Result Value Ref Range Status   Specimen Description FOOT  Final   Special Requests Immunocompromised  Final   Gram Stain FEW WBC SEEN MODERATE GRAM NEGATIVE RODS   Final   Culture NO GROWTH 4 DAYS  Final   Report Status 08/09/2015 FINAL  Final  C difficile quick scan w PCR reflex     Status: None   Collection Time: 08/07/15 11:38 AM  Result Value Ref Range Status   C Diff antigen NEGATIVE NEGATIVE Final   C Diff toxin NEGATIVE NEGATIVE Final   C Diff interpretation Negative for C. difficile  Final  Culture, blood (routine x 2)     Status: None (Preliminary result)   Collection Time: 08/09/15  9:12 AM  Result Value Ref Range Status   Specimen Description BLOOD RIGHT FATTY CASTS  Final   Special Requests BOTTLES DRAWN AEROBIC AND ANAEROBIC  12CC  Final   Culture NO GROWTH 4 DAYS  Final   Report Status PENDING  Incomplete  Culture, blood (routine x 2)     Status: None (Preliminary result)   Collection Time: 08/09/15  9:22 AM  Result Value Ref Range Status   Specimen Description BLOOD LEFT ASSIST CONTROL  Final  Special Requests BOTTLES DRAWN AEROBIC AND ANAEROBIC  12CC  Final   Culture NO GROWTH 4 DAYS  Final   Report Status PENDING  Incomplete     Allergies:  Allergies  Allergen Reactions  . Codeine Nausea And Vomiting  . Tape Rash    Discharge antibiotics Zosyn 3.375 grams every 8 hours  PICC Care per protocol Labs weekly while on IV antibiotics      CBC w diff   Comprehensive met panel CRP    Stop date 08/27/15  Follow up clinic date TBD   FAX weekly labs to [phone]  Leonel Ramsay, MD

## 2015-08-14 ENCOUNTER — Encounter: Payer: Self-pay | Admitting: Internal Medicine

## 2015-08-14 DIAGNOSIS — G4730 Sleep apnea, unspecified: Secondary | ICD-10-CM | POA: Diagnosis present

## 2015-08-14 DIAGNOSIS — L03116 Cellulitis of left lower limb: Secondary | ICD-10-CM | POA: Diagnosis present

## 2015-08-14 DIAGNOSIS — Z7982 Long term (current) use of aspirin: Secondary | ICD-10-CM | POA: Diagnosis not present

## 2015-08-14 DIAGNOSIS — Z886 Allergy status to analgesic agent status: Secondary | ICD-10-CM | POA: Diagnosis not present

## 2015-08-14 DIAGNOSIS — Z8249 Family history of ischemic heart disease and other diseases of the circulatory system: Secondary | ICD-10-CM | POA: Diagnosis not present

## 2015-08-14 DIAGNOSIS — A401 Sepsis due to streptococcus, group B: Secondary | ICD-10-CM | POA: Diagnosis present

## 2015-08-14 DIAGNOSIS — Z79899 Other long term (current) drug therapy: Secondary | ICD-10-CM | POA: Diagnosis not present

## 2015-08-14 DIAGNOSIS — E785 Hyperlipidemia, unspecified: Secondary | ICD-10-CM | POA: Diagnosis present

## 2015-08-14 DIAGNOSIS — Z825 Family history of asthma and other chronic lower respiratory diseases: Secondary | ICD-10-CM | POA: Diagnosis not present

## 2015-08-14 DIAGNOSIS — L97519 Non-pressure chronic ulcer of other part of right foot with unspecified severity: Secondary | ICD-10-CM | POA: Diagnosis present

## 2015-08-14 DIAGNOSIS — Z833 Family history of diabetes mellitus: Secondary | ICD-10-CM | POA: Diagnosis not present

## 2015-08-14 DIAGNOSIS — I482 Chronic atrial fibrillation: Secondary | ICD-10-CM | POA: Diagnosis present

## 2015-08-14 DIAGNOSIS — M1990 Unspecified osteoarthritis, unspecified site: Secondary | ICD-10-CM | POA: Diagnosis present

## 2015-08-14 DIAGNOSIS — Z961 Presence of intraocular lens: Secondary | ICD-10-CM | POA: Diagnosis present

## 2015-08-14 DIAGNOSIS — E1152 Type 2 diabetes mellitus with diabetic peripheral angiopathy with gangrene: Secondary | ICD-10-CM | POA: Diagnosis present

## 2015-08-14 DIAGNOSIS — Z9842 Cataract extraction status, left eye: Secondary | ICD-10-CM | POA: Diagnosis not present

## 2015-08-14 DIAGNOSIS — Z794 Long term (current) use of insulin: Secondary | ICD-10-CM | POA: Diagnosis not present

## 2015-08-14 DIAGNOSIS — Z9861 Coronary angioplasty status: Secondary | ICD-10-CM | POA: Diagnosis not present

## 2015-08-14 DIAGNOSIS — M869 Osteomyelitis, unspecified: Secondary | ICD-10-CM | POA: Diagnosis present

## 2015-08-14 DIAGNOSIS — D638 Anemia in other chronic diseases classified elsewhere: Secondary | ICD-10-CM | POA: Diagnosis present

## 2015-08-14 DIAGNOSIS — E860 Dehydration: Secondary | ICD-10-CM | POA: Diagnosis present

## 2015-08-14 DIAGNOSIS — I10 Essential (primary) hypertension: Secondary | ICD-10-CM | POA: Diagnosis present

## 2015-08-14 DIAGNOSIS — Z9889 Other specified postprocedural states: Secondary | ICD-10-CM | POA: Diagnosis not present

## 2015-08-14 DIAGNOSIS — Z888 Allergy status to other drugs, medicaments and biological substances status: Secondary | ICD-10-CM | POA: Diagnosis not present

## 2015-08-14 DIAGNOSIS — Z87891 Personal history of nicotine dependence: Secondary | ICD-10-CM | POA: Diagnosis not present

## 2015-08-14 DIAGNOSIS — Z90710 Acquired absence of both cervix and uterus: Secondary | ICD-10-CM | POA: Diagnosis not present

## 2015-08-14 DIAGNOSIS — E871 Hypo-osmolality and hyponatremia: Secondary | ICD-10-CM | POA: Diagnosis present

## 2015-08-14 DIAGNOSIS — I739 Peripheral vascular disease, unspecified: Secondary | ICD-10-CM | POA: Diagnosis present

## 2015-08-14 DIAGNOSIS — E1165 Type 2 diabetes mellitus with hyperglycemia: Secondary | ICD-10-CM | POA: Diagnosis present

## 2015-08-14 DIAGNOSIS — H9190 Unspecified hearing loss, unspecified ear: Secondary | ICD-10-CM | POA: Diagnosis present

## 2015-08-14 LAB — CBC
HCT: 24.1 % — ABNORMAL LOW (ref 35.0–47.0)
Hemoglobin: 7.7 g/dL — ABNORMAL LOW (ref 12.0–16.0)
MCH: 25.4 pg — ABNORMAL LOW (ref 26.0–34.0)
MCHC: 32.1 g/dL (ref 32.0–36.0)
MCV: 79 fL — ABNORMAL LOW (ref 80.0–100.0)
PLATELETS: 525 10*3/uL — AB (ref 150–440)
RBC: 3.05 MIL/uL — ABNORMAL LOW (ref 3.80–5.20)
RDW: 13.9 % (ref 11.5–14.5)
WBC: 20.6 10*3/uL — AB (ref 3.6–11.0)

## 2015-08-14 LAB — CULTURE, BLOOD (ROUTINE X 2)
CULTURE: NO GROWTH
Culture: NO GROWTH

## 2015-08-14 LAB — COMPREHENSIVE METABOLIC PANEL
ALBUMIN: 2 g/dL — AB (ref 3.5–5.0)
ALT: 34 U/L (ref 14–54)
ANION GAP: 9 (ref 5–15)
AST: 29 U/L (ref 15–41)
Alkaline Phosphatase: 78 U/L (ref 38–126)
BILIRUBIN TOTAL: 0.4 mg/dL (ref 0.3–1.2)
BUN: 16 mg/dL (ref 6–20)
CHLORIDE: 97 mmol/L — AB (ref 101–111)
CO2: 24 mmol/L (ref 22–32)
Calcium: 8.1 mg/dL — ABNORMAL LOW (ref 8.9–10.3)
Creatinine, Ser: 1.19 mg/dL — ABNORMAL HIGH (ref 0.44–1.00)
GFR calc Af Amer: 54 mL/min — ABNORMAL LOW (ref >60)
GFR calc non Af Amer: 47 mL/min — ABNORMAL LOW (ref >60)
GLUCOSE: 178 mg/dL — AB (ref 65–99)
POTASSIUM: 3.8 mmol/L (ref 3.5–5.1)
SODIUM: 130 mmol/L — AB (ref 135–145)
TOTAL PROTEIN: 7 g/dL (ref 6.5–8.1)

## 2015-08-14 LAB — GLUCOSE, CAPILLARY
GLUCOSE-CAPILLARY: 151 mg/dL — AB (ref 65–99)
GLUCOSE-CAPILLARY: 120 mg/dL — AB (ref 65–99)
Glucose-Capillary: 108 mg/dL — ABNORMAL HIGH (ref 65–99)
Glucose-Capillary: 116 mg/dL — ABNORMAL HIGH (ref 65–99)

## 2015-08-14 LAB — LACTIC ACID, PLASMA
Lactic Acid, Venous: 1.4 mmol/L (ref 0.5–2.0)
Lactic Acid, Venous: 0.9 mmol/L (ref 0.5–2.0)

## 2015-08-14 MED ORDER — ONDANSETRON HCL 4 MG/2ML IJ SOLN: 4.0000 mg | Freq: Four times a day (QID) | INTRAMUSCULAR | Status: DC | PRN

## 2015-08-14 MED ORDER — ATORVASTATIN CALCIUM 20 MG PO TABS
40.0000 mg | ORAL_TABLET | Freq: Every day | ORAL | Status: DC
  Administered 2015-08-14 – 2015-08-19 (×6): 40 mg via ORAL
  Filled 2015-08-14 (×6): qty 2

## 2015-08-14 MED ORDER — MORPHINE SULFATE (PF) 2 MG/ML IV SOLN
2.0000 mg | INTRAVENOUS | Status: DC | PRN
  Administered 2015-08-17 – 2015-08-20 (×8): 2 mg via INTRAVENOUS
  Filled 2015-08-14 (×11): qty 1

## 2015-08-14 MED ORDER — SODIUM CHLORIDE 0.9 % IV SOLN
INTRAVENOUS | Status: DC
  Administered 2015-08-14: 09:00:00 via INTRAVENOUS
  Administered 2015-08-14: 1000 mL via INTRAVENOUS
  Administered 2015-08-14 – 2015-08-18 (×7): via INTRAVENOUS

## 2015-08-14 MED ORDER — VITAMIN E 180 MG (400 UNIT) PO CAPS
400.0000 [IU] | ORAL_CAPSULE | Freq: Every day | ORAL | Status: DC
  Administered 2015-08-14 – 2015-08-19 (×5): 400 [IU] via ORAL
  Filled 2015-08-14 (×6): qty 1

## 2015-08-14 MED ORDER — INSULIN NPH (HUMAN) (ISOPHANE) 100 UNIT/ML ~~LOC~~ SUSP: 6.0000 [IU] | Freq: Two times a day (BID) | SUBCUTANEOUS | Status: DC

## 2015-08-14 MED ORDER — SOTALOL HCL 80 MG PO TABS
80.0000 mg | ORAL_TABLET | Freq: Two times a day (BID) | ORAL | Status: DC
  Administered 2015-08-14 – 2015-08-20 (×12): 80 mg via ORAL
  Filled 2015-08-14 (×14): qty 1

## 2015-08-14 MED ORDER — ACETAMINOPHEN 650 MG RE SUPP: 650.0000 mg | Freq: Four times a day (QID) | RECTAL | Status: DC | PRN

## 2015-08-14 MED ORDER — INSULIN DETEMIR 100 UNIT/ML ~~LOC~~ SOLN
6.0000 [IU] | Freq: Two times a day (BID) | SUBCUTANEOUS | Status: DC
  Administered 2015-08-14 – 2015-08-20 (×12): 6 [IU] via SUBCUTANEOUS
  Filled 2015-08-14 (×16): qty 0.06

## 2015-08-14 MED ORDER — ACETAMINOPHEN 325 MG PO TABS
650.0000 mg | ORAL_TABLET | Freq: Four times a day (QID) | ORAL | Status: DC | PRN
  Administered 2015-08-15 – 2015-08-16 (×3): 650 mg via ORAL
  Filled 2015-08-14 (×3): qty 2

## 2015-08-14 MED ORDER — VANCOMYCIN HCL IN DEXTROSE 1-5 GM/200ML-% IV SOLN
1000.0000 mg | INTRAVENOUS | Status: DC
  Administered 2015-08-14 – 2015-08-17 (×4): 1000 mg via INTRAVENOUS
  Filled 2015-08-14 (×6): qty 200

## 2015-08-14 MED ORDER — DIPHENHYDRAMINE HCL 25 MG PO CAPS: 25.0000 mg | ORAL_CAPSULE | Freq: Every evening | ORAL | Status: DC | PRN

## 2015-08-14 MED ORDER — INSULIN ASPART 100 UNIT/ML ~~LOC~~ SOLN
0.0000 [IU] | Freq: Three times a day (TID) | SUBCUTANEOUS | Status: DC
  Administered 2015-08-14 – 2015-08-15 (×2): 3 [IU] via SUBCUTANEOUS
  Administered 2015-08-15: 5 [IU] via SUBCUTANEOUS
  Administered 2015-08-16 (×2): 2 [IU] via SUBCUTANEOUS
  Administered 2015-08-17: 3 [IU] via SUBCUTANEOUS
  Administered 2015-08-18: 2 [IU] via SUBCUTANEOUS
  Administered 2015-08-18: 5 [IU] via SUBCUTANEOUS
  Administered 2015-08-19 – 2015-08-20 (×3): 3 [IU] via SUBCUTANEOUS
  Filled 2015-08-14 (×2): qty 2
  Filled 2015-08-14: qty 3
  Filled 2015-08-14: qty 5
  Filled 2015-08-14 (×2): qty 3
  Filled 2015-08-14 (×3): qty 2
  Filled 2015-08-14: qty 3
  Filled 2015-08-14: qty 5
  Filled 2015-08-14: qty 3

## 2015-08-14 MED ORDER — VITAMIN B-6 50 MG PO TABS
100.0000 mg | ORAL_TABLET | Freq: Every day | ORAL | Status: DC
Start: 2015-08-14 — End: 2015-08-20
  Administered 2015-08-15 – 2015-08-19 (×4): 100 mg via ORAL
  Filled 2015-08-14 (×7): qty 1

## 2015-08-14 MED ORDER — VANCOMYCIN HCL IN DEXTROSE 1-5 GM/200ML-% IV SOLN
1000.0000 mg | Freq: Once | INTRAVENOUS | Status: AC
  Administered 2015-08-14: 1000 mg via INTRAVENOUS
  Filled 2015-08-14: qty 200

## 2015-08-14 MED ORDER — OXYCODONE HCL 5 MG PO TABS
5.0000 mg | ORAL_TABLET | ORAL | Status: DC | PRN
  Administered 2015-08-17 – 2015-08-20 (×8): 5 mg via ORAL
  Filled 2015-08-14 (×8): qty 1

## 2015-08-14 MED ORDER — GABAPENTIN 100 MG PO CAPS
100.0000 mg | ORAL_CAPSULE | Freq: Three times a day (TID) | ORAL | Status: DC
  Administered 2015-08-14 – 2015-08-20 (×19): 100 mg via ORAL
  Filled 2015-08-14 (×19): qty 1

## 2015-08-14 MED ORDER — HEPARIN SODIUM (PORCINE) 5000 UNIT/ML IJ SOLN
5000.0000 [IU] | Freq: Three times a day (TID) | INTRAMUSCULAR | Status: DC
  Administered 2015-08-14: 5000 [IU] via SUBCUTANEOUS
  Filled 2015-08-14: qty 1

## 2015-08-14 MED ORDER — INSULIN ASPART 100 UNIT/ML ~~LOC~~ SOLN
0.0000 [IU] | Freq: Every day | SUBCUTANEOUS | Status: DC
  Administered 2015-08-18: 2 [IU] via SUBCUTANEOUS

## 2015-08-14 MED ORDER — HEPARIN SODIUM (PORCINE) 5000 UNIT/ML IJ SOLN
5000.0000 [IU] | Freq: Three times a day (TID) | INTRAMUSCULAR | Status: DC
  Administered 2015-08-14 – 2015-08-18 (×9): 5000 [IU] via SUBCUTANEOUS
  Filled 2015-08-14 (×9): qty 1

## 2015-08-14 MED ORDER — ONDANSETRON HCL 4 MG PO TABS: 4.0000 mg | ORAL_TABLET | Freq: Four times a day (QID) | ORAL | Status: DC | PRN

## 2015-08-14 MED ORDER — PIPERACILLIN-TAZOBACTAM 4.5 G IVPB
4.5000 g | Freq: Three times a day (TID) | INTRAVENOUS | Status: DC
  Administered 2015-08-14 – 2015-08-18 (×14): 4.5 g via INTRAVENOUS
  Filled 2015-08-14 (×18): qty 100

## 2015-08-14 NOTE — Progress Notes (Signed)
MD approved use of PICC line

## 2015-08-14 NOTE — Progress Notes (Signed)
Dr Lucky Cowboy has been following    Will plan for amputation early next week

## 2015-08-14 NOTE — Progress Notes (Signed)
Dr.Mody here to see patient and discussing plan of care with son.

## 2015-08-14 NOTE — Progress Notes (Signed)
[facility]  Daily Progress Note   Subjective  - * No surgery date entered *  Readmitted less than 24 hours after discharge with fever.  WBC up to 20K.  Having a lot of left foot pain.  Now agreeable to BKA.   ABx restarted Right foot wounds stable.  Objective Filed Vitals:   08/14/15 0151 08/14/15 0541 08/14/15 0734 08/14/15 1607  BP: 119/48 128/55 125/47 142/58  Pulse: 58 93 85 104  Temp: 98.7 F (37.1 C) 99.1 F (37.3 C) 98.5 F (36.9 C) 99.6 F (37.6 C)  TempSrc: Oral Oral Oral Oral  Resp: 18 18 18 18   Height: 5\' 2"  (1.575 m)     Weight: 69.945 kg (154 lb 3.2 oz)     SpO2: 94% 96% 96% 99%    Intake/Output Summary (Last 24 hours) at 08/14/15 1656 Last data filed at 08/14/15 1213  Gross per 24 hour  Intake   1017 ml  Output    500 ml  Net    517 ml    PULM  CTAB CV  RRR VASC  Foot demarcating from the mid foot on the left.  Moderate cellulitis changes of erythema.  Right foot wounds stable.  Laboratory CBC    Component Value Date/Time   WBC 20.6* 08/14/2015 0004   HGB 7.7* 08/14/2015 0004   HCT 24.1* 08/14/2015 0004   PLT 525* 08/14/2015 0004    BMET    Component Value Date/Time   NA 130* 08/14/2015 0004   K 3.8 08/14/2015 0004   CL 97* 08/14/2015 0004   CO2 24 08/14/2015 0004   GLUCOSE 178* 08/14/2015 0004   BUN 16 08/14/2015 0004   CREATININE 1.19* 08/14/2015 0004   CALCIUM 8.1* 08/14/2015 0004   GFRNONAA 47* 08/14/2015 0004   GFRAA 54* 08/14/2015 0004    Assessment/Planning: Patient with cellulitis readmitted less  Than 24 hours after discharge for fever and worsening pain   PAD improved with intervention, but foot has extensive tissue loss and not sure she has enough foot to bear weight even if the cellulitis improves to the point to allow demarcation.  With all these factors, BKA is the most likely choice to allow a single procedure and wound healing with ultimate ability to walk with a prosthesis faster.  She should  have a good chance or healing a primary BKA, and this would also remove the nidus for infection and pain.  She is now agreeable and I have scheduled this for Monday.    Also has ulcers on right foot and eventually, will need angio of that side as well but would try to get left leg healed first.    DEW,JASON  08/14/2015, 4:56 PM

## 2015-08-14 NOTE — Progress Notes (Signed)
ANTIBIOTIC CONSULT NOTE - INITIAL  Pharmacy Consult for vancomycin and Zosyn dosing Indication: wound infection/osteomyelitis?  Allergies  Allergen Reactions  . Codeine Nausea And Vomiting  . Tape Rash    Patient Measurements: Height: 5\' 2"  (157.5 cm) Weight: 154 lb 3.2 oz (69.945 kg) IBW/kg (Calculated) : 50.1 Adjusted Body Weight: 62.6 kg  Vital Signs: Temp: 98.5 F (36.9 C) (09/09 0734) Temp Source: Oral (09/09 0734) BP: 125/47 mmHg (09/09 0734) Pulse Rate: 85 (09/09 0734) Intake/Output from previous day:   Intake/Output from this shift: Total I/O In: 1017 [I.V.:1017] Out: 500 [Urine:500]  Labs:  Recent Labs  08/12/15 0501 08/14/15 0004  WBC 16.9* 20.6*  HGB 7.8* 7.7*  PLT 463* 525*  CREATININE 1.19* 1.19*   Estimated Creatinine Clearance: 42.6 mL/min (by C-G formula based on Cr of 1.19). No results for input(s): VANCOTROUGH, VANCOPEAK, VANCORANDOM, GENTTROUGH, GENTPEAK, GENTRANDOM, TOBRATROUGH, TOBRAPEAK, TOBRARND, AMIKACINPEAK, AMIKACINTROU, AMIKACIN in the last 72 hours.   Microbiology: Recent Results (from the past 720 hour(s))  Culture, blood (routine x 2)     Status: None   Collection Time: 08/03/15  5:12 PM  Result Value Ref Range Status   Specimen Description BLOOD RIGHT ARM  Final   Special Requests BOTTLES DRAWN AEROBIC AND ANAEROBIC [place]  Final   Culture NO GROWTH 5 DAYS  Final   Report Status 08/08/2015 FINAL  Final  Culture, blood (routine x 2)     Status: None   Collection Time: 08/03/15  5:15 PM  Result Value Ref Range Status   Specimen Description BLOOD LEFT ARM  Final   Special Requests BOTTLES DRAWN AEROBIC AND ANAEROBIC 4CC  Final   Culture  Setup Time   Final    GRAM POSITIVE COCCI IN PAIRS IN CHAINS AEROBIC BOTTLE ONLY CRITICAL RESULT CALLED TO, READ BACK BY AND VERIFIED WITH: MICHAEL JACOBS 08/04/15 1320 JGF    Culture   Final    GROUP B STREP(S.AGALACTIAE)ISOLATED STREPTOCOCCUS SANGUINIS AEROBIC BOTTLE ONLY Virtually 100% of  S. agalactiae (Group B) strains are susceptible to Penicillin.  For Penicillin-allergic patients, Erythromycin (85-95% sensitive) and Clindamycin (80% sensitive) are drugs of choice. Contact microbiology lab to request sensitivities if  needed within 7 days. ORG 2 SUGGESTIVE OF CONTAMINATION    Report Status 08/07/2015 FINAL  Final   Organism ID, Bacteria GROUP B STREP(S.AGALACTIAE)ISOLATED  Final      Susceptibility   Group b strep(s.agalactiae)isolated - MIC*    CLINDAMYCIN SENSITIVE Sensitive     CIPROFLOXACIN SENSITIVE Sensitive     VANCOMYCIN SENSITIVE Sensitive     ERYTHROMYCIN SENSITIVE Sensitive     * GROUP B STREP(S.AGALACTIAE)ISOLATED  Urine culture     Status: None   Collection Time: 08/03/15  6:10 PM  Result Value Ref Range Status   Specimen Description URINE, RANDOM  Final   Special Requests NONE  Final   Culture MULTIPLE SPECIES PRESENT, SUGGEST RECOLLECTION  Final   Report Status 08/05/2015 FINAL  Final  Wound culture     Status: None   Collection Time: 08/04/15  6:00 PM  Result Value Ref Range Status   Specimen Description FOOT  Final   Special Requests Immunocompromised  Final   Gram Stain FEW WBC SEEN MODERATE GRAM NEGATIVE RODS   Final   Culture NO GROWTH 4 DAYS  Final   Report Status 08/09/2015 FINAL  Final  C difficile quick scan w PCR reflex     Status: None   Collection Time: 08/07/15 11:38 AM  Result Value Ref Range  Status   C Diff antigen NEGATIVE NEGATIVE Final   C Diff toxin NEGATIVE NEGATIVE Final   C Diff interpretation Negative for C. difficile  Final  Culture, blood (routine x 2)     Status: None   Collection Time: 08/09/15  9:12 AM  Result Value Ref Range Status   Specimen Description BLOOD RIGHT FATTY CASTS  Final   Special Requests BOTTLES DRAWN AEROBIC AND ANAEROBIC  12CC  Final   Culture NO GROWTH 5 DAYS  Final   Report Status 08/14/2015 FINAL  Final  Culture, blood (routine x 2)     Status: None   Collection Time: 08/09/15  9:22 AM   Result Value Ref Range Status   Specimen Description BLOOD LEFT ASSIST CONTROL  Final   Special Requests BOTTLES DRAWN AEROBIC AND ANAEROBIC  12CC  Final   Culture NO GROWTH 5 DAYS  Final   Report Status 08/14/2015 FINAL  Final  Culture, blood (routine x 2)     Status: None (Preliminary result)   Collection Time: 08/14/15 12:04 AM  Result Value Ref Range Status   Specimen Description BLOOD RIGHT ARM  Final   Special Requests BOTTLES DRAWN AEROBIC AND ANAEROBIC 5ML  Final   Culture NO GROWTH < 24 HOURS  Final   Report Status PENDING  Incomplete  Culture, blood (routine x 2)     Status: None (Preliminary result)   Collection Time: 08/14/15 12:49 AM  Result Value Ref Range Status   Specimen Description BLOOD RIGHT ANTECUBITAL  Final   Special Requests BOTTLES DRAWN AEROBIC AND ANAEROBIC 5ML  Final   Culture NO GROWTH < 12 HOURS  Final   Report Status PENDING  Incomplete    Assessment: Pharmacy consulted to dose vancomycin and zosyn for gangrenous foot in this 64 year old female. Patient discharged home on zosyn on 9/8 after admission for gangrenous foot and group B strep bacteremia. Readmitted for sepsis/fever. Now planning for BKA sometime next week.   Goal of Therapy:  Vancomycin trough level 15-20 mcg/ml  Plan:  TBW 62.6kg  IBW 50.1kg  Dw 62.6kg  Vd 44L kei 0.038 hr-1  T1/2 18 hours  Will continue vancomycin 1gm IV Q24H. Trough ordered prior to 5th dose, which should be at steady state.  Continue zosyn 4.5gm IV Q8H extended infusion.   Rexene Edison, PharmD Clinical Pharmacist  08/14/2015 1:33 PM

## 2015-08-14 NOTE — Progress Notes (Addendum)
[place] at [place] NAME: [name]    MR#:  [id number]  DATE OF BIRTH:  March 07, 1951  SUBJECTIVE:   patient now agrees for BKA.  REVIEW OF SYSTEMS:    Review of Systems  Constitutional: Positive for fever. Negative for chills and malaise/fatigue.  HENT: Negative for sore throat.   Eyes: Negative for blurred vision.  Respiratory: Negative for cough, hemoptysis, shortness of breath and wheezing.   Cardiovascular: Negative for palpitations and leg swelling.  Gastrointestinal: Negative for nausea, vomiting, abdominal pain, diarrhea and blood in stool.  Genitourinary: Negative for dysuria.  Musculoskeletal: Negative for back pain.  Skin:       Left foot cold and blue  Neurological: Negative for dizziness, tremors and headaches.  Endo/Heme/Allergies: Does not bruise/bleed easily.    Tolerating Diet:yes      DRUG ALLERGIES:   Allergies  Allergen Reactions  . Codeine Nausea And Vomiting  . Tape Rash    VITALS:  Blood pressure 125/47, pulse 85, temperature 98.5 F (36.9 C), temperature source Oral, resp. rate 18, height 5\' 2"  (1.575 m), weight 69.945 kg (154 lb 3.2 oz), SpO2 96 %.  PHYSICAL EXAMINATION:   Physical Exam  Constitutional: She is oriented to person, place, and time and well-developed, well-nourished, and in no distress. No distress.  HENT:  Head: Normocephalic.  Eyes: No scleral icterus.  Neck: Normal range of motion. Neck supple. No JVD present. No tracheal deviation present.  Cardiovascular: Normal rate, regular rhythm and normal heart sounds.  Exam reveals no gallop and no friction rub.   No murmur heard. Pulmonary/Chest: Effort normal and breath sounds normal. No respiratory distress. She has no wheezes. She has no rales. She exhibits no tenderness.  Abdominal: Soft. Bowel sounds are normal. She exhibits no distension and no mass. There is no tenderness. There is no rebound and no guarding.   Musculoskeletal: Normal range of motion. She exhibits no edema.  Neurological: She is alert and oriented to person, place, and time.  Skin: Skin is warm. No erythema.  Left foot is cold to touch and blue  Psychiatric: Affect and judgment normal.      LABORATORY PANEL:   CBC  Recent Labs Lab 08/14/15 0004  WBC 20.6*  HGB 7.7*  HCT 24.1*  PLT 525*   ------------------------------------------------------------------------------------------------------------------  Chemistries   Recent Labs Lab 08/14/15 0004  NA 130*  K 3.8  CL 97*  CO2 24  GLUCOSE 178*  BUN 16  CREATININE 1.19*  CALCIUM 8.1*  AST 29  ALT 34  ALKPHOS 78  BILITOT 0.4   ------------------------------------------------------------------------------------------------------------------  Cardiac Enzymes No results for input(s): TROPONINI in the last 168 hours. ------------------------------------------------------------------------------------------------------------------  RADIOLOGY:  Dg Chest Port 1 View  08/13/2015   CLINICAL DATA:  PICC line placement.  EXAM: PORTABLE CHEST - 1 VIEW  COMPARISON:  08/13/2015  FINDINGS: Previously seen left subclavian approach PICC line has been retracted, its tip now overlies expected location of cavoatrial junction.  Cardiomediastinal silhouette is enlarged. Mediastinal contours appear intact. Aorta is torturous and contains atherosclerotic calcifications.  There is no evidence of focal airspace consolidation, pleural effusion or pneumothorax.  Osseous structures are without acute abnormality. Soft tissues are grossly normal.  IMPRESSION: PICC line in good position.  Enlarged cardiac silhouette.   Electronically Signed   By: Fidela Salisbury M.D.   On: 08/13/2015 15:30   Dg Chest Port 1 View  08/13/2015   CLINICAL DATA:  PICC placement.  EXAM:  PORTABLE CHEST - 1 VIEW  COMPARISON:  08/09/2015  FINDINGS: Left-sided PICC is been inserted. The tip is 7.7 cm below the  carina in the right atrium. I recommend this be retracted 3 cm.  Heart size and pulmonary vascularity are normal. Minimal atelectasis at the left lung base. Tiny bilateral effusions.  Aortic atherosclerosis.  IMPRESSION: PICC tip is in the right atrium and could be retracted 3 cm.  Small bilateral effusions.   Electronically Signed   By: Lorriane Shire M.D.   On: 08/13/2015 15:02     ASSESSMENT AND PLAN:   64 year old female with peripheral arterial disease status post PCI for left lower extremity who presented with left foot cellulitis with gangrene and sepsis.  1. Sepsis: Patient ha had group B strep in blood culture  During recent admission. She presented with fever and leukocytosis this admission. Sepsis is likelyy from her gangrenous foot.  She is currently on Zosyn which I will continue. ID has been consulted.   2. Left foot gangrene: Patient is status post left lower extremity angioplasty  And during last hospitalization she did not want amputation. She presents with fever and now says she would like a BKA. This will be planned for next week as she once Dr. Lucky Cowboy to perform the surgery and he will not be here until next week. Continue IV antibiotics.   3. Peripheral arterial disease: Status post PCI. Continue aspirin and statin  4. Hyponatremia:  Continue IV fluids and repeat BMP in a.m.  5. Type 2 diabetes:  Continue sliding scale insulin, ADA diet , Amaryl and Levemir.   6. Chronic atrial fibrillation: Patient's heart rate is controlled.   7. Anemia of chronic disease: Patient's hemoglobin remains to be stable during 7 and 8. No need for transfusion at this time. CBC will be ordered for a.m.   Management plans discussed with the patient and son they are in agreement.  CODE STATUS: FULL  TOTAL TIME TAKING CARE OF THIS PATIENT: 35 minutes.     POSSIBLE D/C 5 days, DEPENDING ON CLINICAL CONDITION.   Tannya Gonet M.D on 08/14/2015 at 11:35 AM  Between 7am to 6pm - Pager -  [phone] After 6pm go to [email or web address] - password [email or web address] [facility]  Office  [phone]  CC: Primary care physician; [name], DO

## 2015-08-14 NOTE — H&P (Signed)
[place] at [place] NAME: [name]    MR#:  [id number]  DATE OF BIRTH:  Aug 15, 1951   DATE OF ADMISSION:  08/13/2015  PRIMARY CARE PHYSICIAN: [name], DO   REQUESTING/REFERRING PHYSICIAN: Owens Shark  CHIEF COMPLAINT:   Chief Complaint  Patient presents with  . Fever   foot pain  HISTORY OF PRESENT ILLNESS:  [name]  is a 64 y.o. female with a known history of peripheral vascular disease with ischemic left foot, type 2 diabetes insulin requiring presenting with fever 10 73F at home with worsening of left foot pain. Of note patient just discharged around 4:30 PM 08/13/2015 discharge diagnosis cellulitis left foot has a PICC line in place and was discharged on IV Zosyn. Despite these measures at home had worsening pain described only as "pain" nonradiating intensity 8/10 no worsening or relieving factors.  PAST MEDICAL HISTORY:   Past Medical History  Diagnosis Date  . Sleep apnea     cpap  . Dysrhythmia     a fib  . Hypertension   . Neuropathy   . History of hiatal hernia   . Anemia   . Arthritis   . Diabetes mellitus without complication   . HOH (hard of hearing)     aids  . A-fib     PAST SURGICAL HISTORY:   Past Surgical History  Procedure Laterality Date  . Abdominal hysterectomy    . Oophorectomy    . Cardioversion    . Endovenous ablation saphenous vein w/ laser    . Cataract extraction w/phaco Left 07/16/2015    Procedure: CATARACT EXTRACTION PHACO AND INTRAOCULAR LENS PLACEMENT (IOC);  Surgeon: Lyla Glassing, MD;  Location: [facility];  Service: Ophthalmology;  Laterality: Left;  [place]: 01:16.4 AP%: 12.7 CDE: 9.68 Fluid lot# [id number]  . Peripheral vascular catheterization Left 08/05/2015    Procedure: Lower Extremity Angiography;  Surgeon: Algernon Huxley, MD;  Location: [facility];  Service: Cardiovascular;  Laterality: Left;  . Peripheral vascular catheterization  08/05/2015    Procedure:  Lower Extremity Intervention;  Surgeon: Algernon Huxley, MD;  Location: [facility];  Service: Cardiovascular;;    SOCIAL HISTORY:   Social History  Substance Use Topics  . Smoking status: Former Smoker -- 1.00 packs/day for 40 years    Types: Cigarettes  . Smokeless tobacco: Not on file  . Alcohol Use: 0.6 oz/week    1 Glasses of wine per week     Comment: occas    FAMILY HISTORY:   Family History  Problem Relation Age of Onset  . Diabetes Mother   . COPD Mother   . Heart disease Mother   . Diabetes Father   . Heart disease Father     DRUG ALLERGIES:   Allergies  Allergen Reactions  . Codeine Nausea And Vomiting  . Tape Rash    REVIEW OF SYSTEMS:  REVIEW OF SYSTEMS:  CONSTITUTIONAL: Positive fevers, chills, denies fatigue, weakness.  EYES: Denies blurred vision, double vision, or eye pain.  EARS, NOSE, THROAT: Denies tinnitus, ear pain, hearing loss.  RESPIRATORY: denies cough, shortness of breath, wheezing  CARDIOVASCULAR: Denies chest pain, palpitations, edema.  GASTROINTESTINAL: Denies nausea, vomiting, diarrhea, abdominal pain.  GENITOURINARY: Denies dysuria, hematuria.  ENDOCRINE: Denies nocturia or thyroid problems. HEMATOLOGIC AND LYMPHATIC: Denies easy bruising or bleeding.  SKIN: Blue/black left foot Denies further rash or lesions.  MUSCULOSKELETAL: Denies pain in neck, back, shoulder, knees, hips, or  further arthritic symptoms.  NEUROLOGIC: Denies paralysis, paresthesias.  PSYCHIATRIC: Denies anxiety or depressive symptoms. Otherwise full review of systems performed by me is negative.   MEDICATIONS AT HOME:   Prior to Admission medications   Medication Sig Start Date End Date Taking? Authorizing Provider  acetaminophen (TYLENOL) 500 MG tablet Take 1,000 mg by mouth every 6 (six) hours as needed for mild pain or headache.    Historical Provider, MD  aspirin EC 81 MG tablet Take 81 mg by mouth 2 (two) times daily.    Historical Provider, MD   atorvastatin (LIPITOR) 40 MG tablet Take 40 mg by mouth at bedtime.    Historical Provider, MD  diphenhydrAMINE (BENADRYL) 25 MG tablet Take 25 mg by mouth at bedtime as needed for sleep.    Historical Provider, MD  gabapentin (NEURONTIN) 100 MG capsule Take 100 mg by mouth 3 (three) times daily.     Historical Provider, MD  glimepiride (AMARYL) 2 MG tablet Take 2 mg by mouth daily with breakfast.    Historical Provider, MD  ibuprofen (ADVIL,MOTRIN) 200 MG tablet Take 400 mg by mouth every 6 (six) hours as needed for headache or mild pain.    Historical Provider, MD  insulin NPH Human (HUMULIN N) 100 UNIT/ML injection Inject 0.06 mLs (6 Units total) into the skin 2 (two) times daily before a meal. 08/13/15   Bettey Costa, MD  metFORMIN (GLUCOPHAGE) 500 MG tablet Take 500 mg by mouth 2 (two) times daily with a meal.    Historical Provider, MD  Omega-3 Fatty Acids (FISH OIL) 1200 MG CAPS Take 1,200 mg by mouth 2 (two) times daily.    Historical Provider, MD  piperacillin-tazobactam (ZOSYN) 3.375 GM/50ML IVPB Inject 50 mLs (3.375 g total) into the vein every 8 (eight) hours. 08/13/15 08/31/15  Bettey Costa, MD  pyridOXINE (VITAMIN B-6) 100 MG tablet Take 100 mg by mouth at bedtime.     Historical Provider, MD  sotalol (BETAPACE) 80 MG tablet Take 80 mg by mouth 2 (two) times daily.     Historical Provider, MD  vitamin E 400 UNIT capsule Take 400 Units by mouth at bedtime.     Historical Provider, MD      VITAL SIGNS:  Blood pressure 129/67, pulse 76, temperature 99.4 F (37.4 C), temperature source Oral, resp. rate 16, height 5\' 2"  (1.575 m), weight 138 lb (62.596 kg), SpO2 96 %.  PHYSICAL EXAMINATION:  VITAL SIGNS: Filed Vitals:   08/13/15 2332  BP: 129/67  Pulse: 76  Temp: 99.4 F (37.4 C)  Resp: 75   GENERAL:64 y.o.female currently in no acute distress.  HEAD: Normocephalic, atraumatic.  EYES: Pupils equal, round, reactive to light. Extraocular muscles intact. No scleral icterus.  MOUTH:  Moist mucosal membrane. Dentition poor. No abscess noted.  EAR, NOSE, THROAT: Clear without exudates. No external lesions.  NECK: Supple. No thyromegaly. No nodules. No JVD.  PULMONARY: Clear to ascultation, without wheeze rails or rhonci. No use of accessory muscles, Good respiratory effort. good air entry bilaterally CHEST: Nontender to palpation.  CARDIOVASCULAR: S1 and S2. Regular rate and rhythm. No murmurs, rubs, or gallops. No edema. Pedal pulses absent left foot  GASTROINTESTINAL: Soft, nontender, nondistended. No masses. Positive bowel sounds. No hepatosplenomegaly.  MUSCULOSKELETAL: No swelling, clubbing, or edema. Range of motion full in all extremities.  NEUROLOGIC: Cranial nerves II through XII are intact. No gross focal neurological deficits. Sensation intact. Reflexes intact.  SKIN: Ischemic changes of left foot cyanotic with distal ulceration minimal  erythema extending up the leg actually improved from prior and no further lesions, rashes, or cyanosis. Skin warm and dry. Turgor intact.  PSYCHIATRIC: Mood, affect within normal limits. The patient is awake, alert and oriented x 3. Insight, judgment intact.    LABORATORY PANEL:   CBC  Recent Labs Lab 08/14/15 0004  WBC 20.6*  HGB 7.7*  HCT 24.1*  PLT 525*   ------------------------------------------------------------------------------------------------------------------  Chemistries   Recent Labs Lab 08/14/15 0004  NA 130*  K 3.8  CL 97*  CO2 24  GLUCOSE 178*  BUN 16  CREATININE 1.19*  CALCIUM 8.1*  AST 29  ALT 34  ALKPHOS 78  BILITOT 0.4   ------------------------------------------------------------------------------------------------------------------  Cardiac Enzymes No results for input(s): TROPONINI in the last 168 hours. ------------------------------------------------------------------------------------------------------------------  RADIOLOGY:  Dg Chest Port 1 View  08/13/2015   CLINICAL DATA:   PICC line placement.  EXAM: PORTABLE CHEST - 1 VIEW  COMPARISON:  08/13/2015  FINDINGS: Previously seen left subclavian approach PICC line has been retracted, its tip now overlies expected location of cavoatrial junction.  Cardiomediastinal silhouette is enlarged. Mediastinal contours appear intact. Aorta is torturous and contains atherosclerotic calcifications.  There is no evidence of focal airspace consolidation, pleural effusion or pneumothorax.  Osseous structures are without acute abnormality. Soft tissues are grossly normal.  IMPRESSION: PICC line in good position.  Enlarged cardiac silhouette.   Electronically Signed   By: Fidela Salisbury M.D.   On: 08/13/2015 15:30   Dg Chest Port 1 View  08/13/2015   CLINICAL DATA:  PICC placement.  EXAM: PORTABLE CHEST - 1 VIEW  COMPARISON:  08/09/2015  FINDINGS: Left-sided PICC is been inserted. The tip is 7.7 cm below the carina in the right atrium. I recommend this be retracted 3 cm.  Heart size and pulmonary vascularity are normal. Minimal atelectasis at the left lung base. Tiny bilateral effusions.  Aortic atherosclerosis.  IMPRESSION: PICC tip is in the right atrium and could be retracted 3 cm.  Small bilateral effusions.   Electronically Signed   By: Lorriane Shire M.D.   On: 08/13/2015 15:02    EKG:   Orders placed or performed during the hospital encounter of 08/03/15  . EKG 12-Lead  . EKG 12-Lead  . EKG    IMPRESSION AND PLAN:   64 year old Caucasian female history of peripheral vascular disease presenting with ischemic left foot, fever, leg pain  1. Sepsis, meeting septic criteria by leukocytosis, tachycardia, temperature present on arrival. Source of left foot cellulitis/ischemic foot  Panculture. Broad-spectrum antibiotics including vancomycin/Zosyn and taper antibiotics when culture data returns (continue consultation with infectious disease). . Continue IV fluid hydration to keep mean arterial pressure greater than 65. He may require  pressor therapy if blood pressure worsens. We will repeat lactic acid given the initial is greater than 2.2. We will also consult vascular surgery, 2. Type 2 diabetes insulin requiring: Hold oral agents continue basal insulin at insulin sliding scale 3. Hyperlipidemia unspecified Lipitor  4. Venous thromboembolism prophylactic: We'll start heparin if not having surgery    All the records are reviewed and case discussed with ED provider. Management plans discussed with the patient, family and they are in agreement.  CODE STATUS: Full code  TOTAL TIME TAKING CARE OF THIS PATIENT: 45 minutes.    Brihana Quickel,  Karenann Cai.D on 08/14/2015 at 1:02 AM  Between 7am to 6pm - Pager - [phone]  After 6pm: House Pager: - [place] [facility]  Office  [phone]  CC: Primary care physician; [name], DO

## 2015-08-14 NOTE — Progress Notes (Signed)
ANTIBIOTIC CONSULT NOTE - INITIAL  Pharmacy Consult for vancomycin and Zosyn dosing Indication: wound infection/osteomyelitis?  Allergies  Allergen Reactions  . Codeine Nausea And Vomiting  . Tape Rash    Patient Measurements: Height: 5\' 2"  (157.5 cm) Weight: 138 lb (62.596 kg) IBW/kg (Calculated) : 50.1 Adjusted Body Weight: 62.6 kg  Vital Signs: Temp: 99.4 F (37.4 C) (09/08 2332) Temp Source: Oral (09/08 2332) BP: 129/67 mmHg (09/08 2332) Pulse Rate: 76 (09/08 2332) Intake/Output from previous day:   Intake/Output from this shift:    Labs:  Recent Labs  08/11/15 0509 08/12/15 0501 08/14/15 0004  WBC 18.0* 16.9* 20.6*  HGB 8.4* 7.8* 7.7*  PLT 492* 463* 525*  CREATININE 1.22* 1.19* 1.19*   Estimated Creatinine Clearance: 40.5 mL/min (by C-G formula based on Cr of 1.19). No results for input(s): VANCOTROUGH, VANCOPEAK, VANCORANDOM, GENTTROUGH, GENTPEAK, GENTRANDOM, TOBRATROUGH, TOBRAPEAK, TOBRARND, AMIKACINPEAK, AMIKACINTROU, AMIKACIN in the last 72 hours.   Microbiology: Recent Results (from the past 720 hour(s))  Culture, blood (routine x 2)     Status: None   Collection Time: 08/03/15  5:12 PM  Result Value Ref Range Status   Specimen Description BLOOD RIGHT ARM  Final   Special Requests BOTTLES DRAWN AEROBIC AND ANAEROBIC [place]  Final   Culture NO GROWTH 5 DAYS  Final   Report Status 08/08/2015 FINAL  Final  Culture, blood (routine x 2)     Status: None   Collection Time: 08/03/15  5:15 PM  Result Value Ref Range Status   Specimen Description BLOOD LEFT ARM  Final   Special Requests BOTTLES DRAWN AEROBIC AND ANAEROBIC 4CC  Final   Culture  Setup Time   Final    GRAM POSITIVE COCCI IN PAIRS IN CHAINS AEROBIC BOTTLE ONLY CRITICAL RESULT CALLED TO, READ BACK BY AND VERIFIED WITH: MICHAEL JACOBS 08/04/15 1320 JGF    Culture   Final    GROUP B STREP(S.AGALACTIAE)ISOLATED STREPTOCOCCUS SANGUINIS AEROBIC BOTTLE ONLY Virtually 100% of S. agalactiae (Group B)  strains are susceptible to Penicillin.  For Penicillin-allergic patients, Erythromycin (85-95% sensitive) and Clindamycin (80% sensitive) are drugs of choice. Contact microbiology lab to request sensitivities if  needed within 7 days. ORG 2 SUGGESTIVE OF CONTAMINATION    Report Status 08/07/2015 FINAL  Final   Organism ID, Bacteria GROUP B STREP(S.AGALACTIAE)ISOLATED  Final      Susceptibility   Group b strep(s.agalactiae)isolated - MIC*    CLINDAMYCIN SENSITIVE Sensitive     CIPROFLOXACIN SENSITIVE Sensitive     VANCOMYCIN SENSITIVE Sensitive     ERYTHROMYCIN SENSITIVE Sensitive     * GROUP B STREP(S.AGALACTIAE)ISOLATED  Urine culture     Status: None   Collection Time: 08/03/15  6:10 PM  Result Value Ref Range Status   Specimen Description URINE, RANDOM  Final   Special Requests NONE  Final   Culture MULTIPLE SPECIES PRESENT, SUGGEST RECOLLECTION  Final   Report Status 08/05/2015 FINAL  Final  Wound culture     Status: None   Collection Time: 08/04/15  6:00 PM  Result Value Ref Range Status   Specimen Description FOOT  Final   Special Requests Immunocompromised  Final   Gram Stain FEW WBC SEEN MODERATE GRAM NEGATIVE RODS   Final   Culture NO GROWTH 4 DAYS  Final   Report Status 08/09/2015 FINAL  Final  C difficile quick scan w PCR reflex     Status: None   Collection Time: 08/07/15 11:38 AM  Result Value Ref Range Status  C Diff antigen NEGATIVE NEGATIVE Final   C Diff toxin NEGATIVE NEGATIVE Final   C Diff interpretation Negative for C. difficile  Final  Culture, blood (routine x 2)     Status: None (Preliminary result)   Collection Time: 08/09/15  9:12 AM  Result Value Ref Range Status   Specimen Description BLOOD RIGHT FATTY CASTS  Final   Special Requests BOTTLES DRAWN AEROBIC AND ANAEROBIC  12CC  Final   Culture NO GROWTH 4 DAYS  Final   Report Status PENDING  Incomplete  Culture, blood (routine x 2)     Status: None (Preliminary result)   Collection Time: 08/09/15   9:22 AM  Result Value Ref Range Status   Specimen Description BLOOD LEFT ASSIST CONTROL  Final   Special Requests BOTTLES DRAWN AEROBIC AND ANAEROBIC  12CC  Final   Culture NO GROWTH 4 DAYS  Final   Report Status PENDING  Incomplete    Medical History: Past Medical History  Diagnosis Date  . Sleep apnea     cpap  . Dysrhythmia     a fib  . Hypertension   . Neuropathy   . History of hiatal hernia   . Anemia   . Arthritis   . Diabetes mellitus without complication   . HOH (hard of hearing)     aids  . A-fib     Medications:   Assessment: Blood cx pending  Goal of Therapy:  Vancomycin trough level 15-20 mcg/ml  Plan:  TBW 62.6kg  IBW 50.1kg  Dw 62.6kg  Vd 44L kei 0.038 hr-1  T1/2 18 hours 1 gram q 24 hours ordered with stacked dosing. Level before 5th dose.  Zosyn 4.5 grams q 8 hours ordered for Pseudomonas risk of recent abx exposure.  Sim Boast, PharmD, BCPS  08/14/2015

## 2015-08-14 NOTE — Care Management Note (Addendum)
Case Management Note  Patient Details  Name: [name] MRN: [id number] Date of Birth: 06/20/1951  Subjective/Objective:  Patient discharged at approximately 4:30pm 09/08/216 with PICC, IV abx and [place] secondary to sepsis and left foot gangrene. Notified Advance of admission. Readmitted with sepsis and gangreene. Vascular consult by Dr. Lucky Cowboy, vascular surgery planned for next Monday or Tuesday. Dr. Doy Hutching updated on readmission   Action/Plan: Surgery Next Week  Expected Discharge Date:                  Expected Discharge Plan:     In-House Referral:     Discharge planning Services  CM Consult  Post Acute Care Choice:    Choice offered to:     DME Arranged:    DME Agency:     HH Arranged:    HH Agency:     Status of Service:  In process, will continue to follow  Medicare Important Message Given:    Date Medicare IM Given:    Medicare IM give by:    Date Additional Medicare IM Given:    Additional Medicare Important Message give by:     If discussed at [place] of Stay Meetings, dates discussed:    Additional Comments:  Jolly Mango, RN 08/14/2015, 10:30 AM

## 2015-08-14 NOTE — Progress Notes (Signed)
Dr.Mody spoke with patient and son after speaking with Dr.Dew- Plan for surgery possible Monday or Tuesday. Informed Dr.Mody regarding Na-130, WBC-20.6, Hgb- 7.7, Creat- 1.19.

## 2015-08-14 NOTE — ED Notes (Signed)
Blood culture draw #1: Right forearm

## 2015-08-15 LAB — C DIFFICILE QUICK SCREEN W PCR REFLEX
C DIFFICILE (CDIFF) INTERP: NEGATIVE
C DIFFICILE (CDIFF) TOXIN: NEGATIVE
C Diff antigen: NEGATIVE

## 2015-08-15 LAB — GLUCOSE, CAPILLARY
GLUCOSE-CAPILLARY: 165 mg/dL — AB (ref 65–99)
Glucose-Capillary: 78 mg/dL (ref 65–99)
Glucose-Capillary: 211 mg/dL — ABNORMAL HIGH (ref 65–99)
Glucose-Capillary: 108 mg/dL — ABNORMAL HIGH (ref 65–99)

## 2015-08-15 LAB — CBC
HEMATOCRIT: 21.9 % — AB (ref 35.0–47.0)
Hemoglobin: 7 g/dL — ABNORMAL LOW (ref 12.0–16.0)
MCH: 25.2 pg — ABNORMAL LOW (ref 26.0–34.0)
MCHC: 32.1 g/dL (ref 32.0–36.0)
MCV: 78.7 fL — AB (ref 80.0–100.0)
PLATELETS: 441 10*3/uL — AB (ref 150–440)
RBC: 2.78 MIL/uL — AB (ref 3.80–5.20)
RDW: 13.9 % (ref 11.5–14.5)
WBC: 15.2 10*3/uL — AB (ref 3.6–11.0)

## 2015-08-15 LAB — FERRITIN: FERRITIN: 189 ng/mL (ref 11–307)

## 2015-08-15 LAB — BASIC METABOLIC PANEL
ANION GAP: 6 (ref 5–15)
BUN: 11 mg/dL (ref 6–20)
CO2: 23 mmol/L (ref 22–32)
Calcium: 8 mg/dL — ABNORMAL LOW (ref 8.9–10.3)
Chloride: 105 mmol/L (ref 101–111)
Creatinine, Ser: 1.04 mg/dL — ABNORMAL HIGH (ref 0.44–1.00)
GFR calc Af Amer: >60 mL/min (ref >60)
GFR, EST NON AFRICAN AMERICAN: 55 mL/min — AB (ref >60)
GLUCOSE: 83 mg/dL (ref 65–99)
POTASSIUM: 3.8 mmol/L (ref 3.5–5.1)
Sodium: 134 mmol/L — ABNORMAL LOW (ref 135–145)

## 2015-08-15 LAB — ABO/RH: ABO/RH(D): O POS

## 2015-08-15 LAB — FOLATE: Folate: 12.6 ng/mL (ref >5.9)

## 2015-08-15 LAB — RETICULOCYTES
RBC.: 2.79 MIL/uL — ABNORMAL LOW (ref 3.80–5.20)
Retic Count, Absolute: 64.2 10*3/uL (ref 19.0–183.0)
Retic Ct Pct: 2.3 % (ref 0.4–3.1)

## 2015-08-15 LAB — IRON AND TIBC
IRON: 14 ug/dL — AB (ref 28–170)
SATURATION RATIOS: 7 % — AB (ref 10.4–31.8)
TIBC: 198 ug/dL — AB (ref 250–450)
UIBC: 184 ug/dL

## 2015-08-15 LAB — PREPARE RBC (CROSSMATCH)

## 2015-08-15 LAB — HEMOGLOBIN AND HEMATOCRIT, BLOOD
HCT: 26.3 % — ABNORMAL LOW (ref 35.0–47.0)
Hemoglobin: 8.4 g/dL — ABNORMAL LOW (ref 12.0–16.0)

## 2015-08-15 MED ORDER — RISAQUAD PO CAPS
1.0000 | ORAL_CAPSULE | Freq: Every day | ORAL | Status: DC
  Administered 2015-08-15 – 2015-08-20 (×6): 1 via ORAL
  Filled 2015-08-15 (×6): qty 1

## 2015-08-15 MED ORDER — SODIUM CHLORIDE 0.9 % IV SOLN
Freq: Once | INTRAVENOUS | Status: AC
  Administered 2015-08-15: 11:00:00 via INTRAVENOUS

## 2015-08-15 NOTE — Progress Notes (Signed)
[facility] at [place] NAME: [name]    MR#:  [id number]  DATE OF BIRTH:  June 14, 1951  SUBJECTIVE:  Patient is having loose stools. Patient denies melanotic stools or hematochezia.  REVIEW OF SYSTEMS:    Review of Systems  Constitutional: Negative for fever, chills and malaise/fatigue.  HENT: Negative for sore throat.   Eyes: Negative for blurred vision.  Respiratory: Negative for cough, hemoptysis, shortness of breath and wheezing.   Cardiovascular: Negative for palpitations and leg swelling.  Gastrointestinal: Positive for diarrhea. Negative for nausea, vomiting, abdominal pain and blood in stool.  Genitourinary: Negative for dysuria.  Musculoskeletal: Negative for back pain.  Skin:       Left foot cold and blue  Neurological: Negative for dizziness, tremors and headaches.  Endo/Heme/Allergies: Does not bruise/bleed easily.    Tolerating Diet:yes      DRUG ALLERGIES:   Allergies  Allergen Reactions  . Codeine Nausea And Vomiting  . Tape Rash    VITALS:  Blood pressure 144/66, pulse 90, temperature 99.3 F (37.4 C), temperature source Oral, resp. rate 18, height 5\' 2"  (1.575 m), weight 69.945 kg (154 lb 3.2 oz), SpO2 97 %.  PHYSICAL EXAMINATION:   Physical Exam  Constitutional: She is oriented to person, place, and time and well-developed, well-nourished, and in no distress. No distress.  HENT:  Head: Normocephalic.  Eyes: No scleral icterus.  Neck: Normal range of motion. Neck supple. No JVD present. No tracheal deviation present.  Cardiovascular: Normal rate, regular rhythm and normal heart sounds.  Exam reveals no gallop and no friction rub.   No murmur heard. Pulmonary/Chest: Effort normal and breath sounds normal. No respiratory distress. She has no wheezes. She has no rales. She exhibits no tenderness.  Abdominal: Soft. Bowel sounds are normal. She exhibits no distension and no mass. There is no  tenderness. There is no rebound and no guarding.  Musculoskeletal: Normal range of motion. She exhibits no edema.  Neurological: She is alert and oriented to person, place, and time.  Skin: Skin is warm. No erythema.  Left foot is cold to touch and blue  Psychiatric: Affect and judgment normal.      LABORATORY PANEL:   CBC  Recent Labs Lab 08/15/15 0332  WBC 15.2*  HGB 7.0*  HCT 21.9*  PLT 441*   ------------------------------------------------------------------------------------------------------------------  Chemistries   Recent Labs Lab 08/14/15 0004 08/15/15 0332  NA 130* 134*  K 3.8 3.8  CL 97* 105  CO2 24 23  GLUCOSE 178* 83  BUN 16 11  CREATININE 1.19* 1.04*  CALCIUM 8.1* 8.0*  AST 29  --   ALT 34  --   ALKPHOS 78  --   BILITOT 0.4  --    ------------------------------------------------------------------------------------------------------------------  Cardiac Enzymes No results for input(s): TROPONINI in the last 168 hours. ------------------------------------------------------------------------------------------------------------------  RADIOLOGY:  Dg Chest Port 1 View  08/13/2015   CLINICAL DATA:  PICC line placement.  EXAM: PORTABLE CHEST - 1 VIEW  COMPARISON:  08/13/2015  FINDINGS: Previously seen left subclavian approach PICC line has been retracted, its tip now overlies expected location of cavoatrial junction.  Cardiomediastinal silhouette is enlarged. Mediastinal contours appear intact. Aorta is torturous and contains atherosclerotic calcifications.  There is no evidence of focal airspace consolidation, pleural effusion or pneumothorax.  Osseous structures are without acute abnormality. Soft tissues are grossly normal.  IMPRESSION: PICC line in good position.  Enlarged cardiac silhouette.   Electronically Signed   By:  Fidela Salisbury M.D.   On: 08/13/2015 15:30   Dg Chest Port 1 View  08/13/2015   CLINICAL DATA:  PICC placement.  EXAM: PORTABLE  CHEST - 1 VIEW  COMPARISON:  08/09/2015  FINDINGS: Left-sided PICC is been inserted. The tip is 7.7 cm below the carina in the right atrium. I recommend this be retracted 3 cm.  Heart size and pulmonary vascularity are normal. Minimal atelectasis at the left lung base. Tiny bilateral effusions.  Aortic atherosclerosis.  IMPRESSION: PICC tip is in the right atrium and could be retracted 3 cm.  Small bilateral effusions.   Electronically Signed   By: Lorriane Shire M.D.   On: 08/13/2015 15:02     ASSESSMENT AND PLAN:   64 year old female with peripheral arterial disease status post PCI for left lower extremity who presented with left foot cellulitis with gangrene and sepsis.  1. Sepsis: Patient  had group B strep in blood culture during recent admission. She presented with fever and leukocytosis this admission. Sepsis is likely from her gangrenous foot.  She is currently on Zosyn and vancomycin which I will continue. ID has been consulted. Blood cultures thus far are negative to date   2. 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 She presents with fever and now says she would like a BKA. This will be planned for next week by Dr. Lucky Cowboy.  3. Peripheral arterial disease: Status post PCI. Continue aspirin and statin  4. Hyponatremia:  Resolving likely secondary to dehydration as sodium level improved with IV fluids.  5. Type 2 diabetes:  Continue sliding scale insulin, ADA diet , Amaryl and Levemir.   6. Chronic atrial fibrillation: Patient's heart rate is controlled on sotalol.   7. Anemia of chronic disease: Patient's hemoglobin at baseline is 7.58 0.5. Her hemoglobin has dropped to 7.0 today. She will be transfused 1 unit of PRBC. She has consented for blood transfusion. Side effects, alternatives and risks have been discussed with the patient. She except these risk and would like it blood transfusion.  Anemia panel has been ordered as well as guaiac test.  8. Diarrhea: I will check C. difficile and add probiotic to her medications.   Management plans discussed with the patient she is in agreement.  CODE STATUS: FULL  TOTAL TIME TAKING CARE OF THIS PATIENT: 30 minutes.     POSSIBLE D/C 5 days, DEPENDING ON CLINICAL CONDITION.   Carleigh Buccieri M.D on 08/15/2015 at 11:05 AM  Between 7am to 6pm - Pager - [phone] After 6pm go to [email or web address] - password [email or web address] [facility]  Office  [phone]  CC: Primary care physician; [name], DO

## 2015-08-15 NOTE — Care Management Note (Addendum)
Case Management Note  Patient Details  Name: [name] MRN: [id number] Date of Birth: [birth year]  Subjective/Objective:    Confirmed with Megan at [facility] that [name] is no longer an active client of [facility], skilled nursing ended on 08/13/15 and PT was never initiated. From her previous hospital admission [name] has a rolling walker at home, and she still has a PICC line. Case Management will follow for discharge planning.                 Action/Plan:   Expected Discharge Date:                  Expected Discharge Plan:     In-House Referral:     Discharge planning Services  CM Consult  Post Acute Care Choice:    Choice offered to:     DME Arranged:    DME Agency:     HH Arranged:    HH Agency:     Status of Service:  In process, will continue to follow  Medicare Important Message Given:    Date Medicare IM Given:    Medicare IM give by:    Date Additional Medicare IM Given:    Additional Medicare Important Message give by:     If discussed at [place] of Stay Meetings, dates discussed:    Additional Comments:  Hamilton Marinello A, RN 08/15/2015, 5:02 PM

## 2015-08-15 NOTE — Progress Notes (Signed)
Patient's stool negative for c-diff, order for enteric precautions removed per protocol. Charge Nurse- Brooklynn informed of result.

## 2015-08-15 NOTE — Progress Notes (Signed)
Pt. Alert and oriented. VSS. No c/o pain. Dressings to feet changed bilaterally. Pills whole with water. Using BSC. For surgery Monday to remove ganegreen extremity. Rested quietly throughout the night.

## 2015-08-15 NOTE — Progress Notes (Signed)
Patient resting in bed. Patient refused dressing change on lower ext's. Patient states that she has had a long day and would like to rest. Will attempt in the morning during my shift. No drainage noted and dressings intact.

## 2015-08-16 LAB — BASIC METABOLIC PANEL
ANION GAP: 8 (ref 5–15)
BUN: 9 mg/dL (ref 6–20)
CALCIUM: 8.3 mg/dL — AB (ref 8.9–10.3)
CO2: 22 mmol/L (ref 22–32)
Chloride: 103 mmol/L (ref 101–111)
Creatinine, Ser: 1.02 mg/dL — ABNORMAL HIGH (ref 0.44–1.00)
GFR, EST NON AFRICAN AMERICAN: 56 mL/min — AB (ref >60)
Glucose, Bld: 95 mg/dL (ref 65–99)
Potassium: 4 mmol/L (ref 3.5–5.1)
SODIUM: 133 mmol/L — AB (ref 135–145)

## 2015-08-16 LAB — CBC
HCT: 27.3 % — ABNORMAL LOW (ref 35.0–47.0)
Hemoglobin: 8.8 g/dL — ABNORMAL LOW (ref 12.0–16.0)
MCH: 25.9 pg — ABNORMAL LOW (ref 26.0–34.0)
MCHC: 32.3 g/dL (ref 32.0–36.0)
MCV: 80 fL (ref 80.0–100.0)
PLATELETS: 453 10*3/uL — AB (ref 150–440)
RBC: 3.41 MIL/uL — AB (ref 3.80–5.20)
RDW: 13.9 % (ref 11.5–14.5)
WBC: 16.1 10*3/uL — AB (ref 3.6–11.0)

## 2015-08-16 LAB — GLUCOSE, CAPILLARY
GLUCOSE-CAPILLARY: 80 mg/dL (ref 65–99)
GLUCOSE-CAPILLARY: 149 mg/dL — AB (ref 65–99)
Glucose-Capillary: 124 mg/dL — ABNORMAL HIGH (ref 65–99)
Glucose-Capillary: 132 mg/dL — ABNORMAL HIGH (ref 65–99)

## 2015-08-16 LAB — MRSA PCR SCREENING: MRSA BY PCR: NEGATIVE

## 2015-08-16 NOTE — Progress Notes (Signed)
Patient's alert and oriented, resting in bed most of the day, dressing changed bilateral lower extremities- wet to dry. offered toileting on hourly rounds. NPO post midnight for possible surgery in am. PICC line-left upper arm infusing normal saline 100 ml/hr, saline lock right forearm.

## 2015-08-16 NOTE — Care Management Important Message (Signed)
Important Message  Patient Details  Name: [name] MRN: [id number] Date of Birth: 01-Mar-1951   Medicare Important Message Given:  Other (see comment)    Cesia Orf A, RN 08/16/2015, 3:41 PM

## 2015-08-16 NOTE — Progress Notes (Signed)
[facility] at [place] NAME: [name]    MR#:  [id number]  DATE OF BIRTH:  08/08/1951  SUBJECTIVE:  Patient is having loose stools. c diff negative. Patient denies melanotic stools or hematochezia. No new complains.  REVIEW OF SYSTEMS:    Review of Systems  Constitutional: Negative for fever, chills and malaise/fatigue.  HENT: Negative for sore throat.   Eyes: Negative for blurred vision.  Respiratory: Negative for cough, hemoptysis, shortness of breath and wheezing.   Cardiovascular: Negative for palpitations and leg swelling.  Gastrointestinal: Positive for diarrhea. Negative for nausea, vomiting, abdominal pain and blood in stool.  Genitourinary: Negative for dysuria.  Musculoskeletal: Negative for back pain.  Skin:       Left foot cold and blue  Neurological: Negative for dizziness, tremors and headaches.  Endo/Heme/Allergies: Does not bruise/bleed easily.    Tolerating Diet:yes   DRUG ALLERGIES:   Allergies  Allergen Reactions  . Codeine Nausea And Vomiting  . Tape Rash    VITALS:  Blood pressure 126/47, pulse 77, temperature 98.5 F (36.9 C), temperature source Oral, resp. rate 18, height 5\' 2"  (1.575 m), weight 69.945 kg (154 lb 3.2 oz), SpO2 94 %.  PHYSICAL EXAMINATION:   Physical Exam  Constitutional: She is oriented to person, place, and time and well-developed, well-nourished, and in no distress. No distress.  HENT:  Head: Normocephalic.  Eyes: No scleral icterus.  Neck: Normal range of motion. Neck supple. No JVD present. No tracheal deviation present.  Cardiovascular: Normal rate, regular rhythm and normal heart sounds.  Exam reveals no gallop and no friction rub.   No murmur heard. Pulmonary/Chest: Effort normal and breath sounds normal. No respiratory distress. She has no wheezes. She has no rales. She exhibits no tenderness.  Abdominal: Soft. Bowel sounds are normal. She exhibits no distension and  no mass. There is no tenderness. There is no rebound and no guarding.  Musculoskeletal: Normal range of motion. She exhibits no edema.  Neurological: She is alert and oriented to person, place, and time.  Skin: Skin is warm. No erythema.  Left foot is cold to touch and blue, left toe- is black.  Psychiatric: Affect and judgment normal.      LABORATORY PANEL:   CBC  Recent Labs Lab 08/16/15 0259  WBC 16.1*  HGB 8.8*  HCT 27.3*  PLT 453*   ------------------------------------------------------------------------------------------------------------------  Chemistries   Recent Labs Lab 08/14/15 0004  08/16/15 0259  NA 130*  < > 133*  K 3.8  < > 4.0  CL 97*  < > 103  CO2 24  < > 22  GLUCOSE 178*  < > 95  BUN 16  < > 9  CREATININE 1.19*  < > 1.02*  CALCIUM 8.1*  < > 8.3*  AST 29  --   --   ALT 34  --   --   ALKPHOS 78  --   --   BILITOT 0.4  --   --   < > = values in this interval not displayed. ------------------------------------------------------------------------------------------------------------------  Cardiac Enzymes No results for input(s): TROPONINI in the last 168 hours. ------------------------------------------------------------------------------------------------------------------  RADIOLOGY:  No results found.   ASSESSMENT AND PLAN:   64 year old female with peripheral arterial disease status post PCI for left lower extremity who presented with left foot cellulitis with gangrene and sepsis.  1. Sepsis: Patient  had group B strep in blood culture during recent admission. She presented with fever and leukocytosis  this admission. Sepsis is likely from her gangrenous foot.  She is currently on Zosyn and vancomycin . ID has been consulted. Blood cultures are negative to date  2. 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 She presents with fever and now says she  would like a BKA. This will be planned for next week by Dr. Lucky Cowboy.  3. Peripheral arterial disease: Status post PCI. Continue aspirin and statin  4. Hyponatremia:  Resolving likely secondary to dehydration as sodium level improved with IV fluids.  5. Type 2 diabetes:  Continue sliding scale insulin, ADA diet , Amaryl and Levemir.  6. Chronic atrial fibrillation: Patient's heart rate is controlled on sotalol.   7. Anemia of chronic disease: Patient's hemoglobin at baseline is 7.58 0.5. Her hemoglobin has dropped to 7.0 today. She will be transfused 1 unit of PRBC. She has consented for blood transfusion. Side effects, alternatives and risks have been discussed with the patient. She except these risk and would like it blood transfusion. Anemia panel has been ordered as well as guaiac test.  8. Diarrhea: negative C. difficile and add probiotic to her medications.   Likely due to antibiotics.   Management plans discussed with the patient she is in agreement.  CODE STATUS: FULL  TOTAL TIME TAKING CARE OF THIS PATIENT: 30 minutes.     POSSIBLE D/C 5 days, DEPENDING ON CLINICAL CONDITION.   [name] M.D on 08/16/2015 at 8:09 AM  Between 7am to 6pm - Pager - [phone] After 6pm go to [email or web address] - password [email or web address] [facility]  Office  [phone]  CC: Primary care physician; [name], DO

## 2015-08-17 ENCOUNTER — Ambulatory Visit: Payer: Medicare Other | Admitting: Family Medicine

## 2015-08-17 ENCOUNTER — Encounter
Admission: EM | Disposition: A | Discharge status: Skilled Nursing Facility | Payer: Self-pay | Source: Home / Self Care | Attending: Internal Medicine

## 2015-08-17 ENCOUNTER — Inpatient Hospital Stay: Payer: Medicare Other | Admitting: Anesthesiology

## 2015-08-17 ENCOUNTER — Encounter: Payer: Self-pay | Admitting: Anesthesiology

## 2015-08-17 HISTORY — PX: AMPUTATION: SHX166

## 2015-08-17 LAB — TYPE AND SCREEN
ABO/RH(D): O POS
ANTIBODY SCREEN: NEGATIVE
UNIT DIVISION: 0

## 2015-08-17 LAB — GLUCOSE, CAPILLARY
GLUCOSE-CAPILLARY: 109 mg/dL — AB (ref 65–99)
GLUCOSE-CAPILLARY: 115 mg/dL — AB (ref 65–99)
Glucose-Capillary: 158 mg/dL — ABNORMAL HIGH (ref 65–99)
Glucose-Capillary: 181 mg/dL — ABNORMAL HIGH (ref 65–99)

## 2015-08-17 LAB — VANCOMYCIN, TROUGH
VANCOMYCIN TR: 17 ug/mL (ref 10–20)
Vancomycin Tr: 16 ug/mL (ref 10–20)

## 2015-08-17 SURGERY — AMPUTATION BELOW KNEE
Anesthesia: General | Laterality: Left

## 2015-08-17 SURGERY — AMPUTATION BELOW KNEE
Anesthesia: General | Site: Leg Lower | Laterality: Left | Wound class: Dirty or Infected

## 2015-08-17 MED ORDER — HYDRALAZINE HCL 20 MG/ML IJ SOLN
10.0000 mg | Freq: Once | INTRAMUSCULAR | Status: AC
  Administered 2015-08-17: 10 mg via INTRAVENOUS

## 2015-08-17 MED ORDER — HYDRALAZINE HCL 25 MG PO TABS
25.0000 mg | ORAL_TABLET | Freq: Three times a day (TID) | ORAL | Status: DC
  Administered 2015-08-17 – 2015-08-20 (×7): 25 mg via ORAL
  Filled 2015-08-17 (×8): qty 1

## 2015-08-17 MED ORDER — FENTANYL CITRATE (PF) 100 MCG/2ML IJ SOLN
25.0000 ug | INTRAMUSCULAR | Status: AC | PRN
  Administered 2015-08-17 (×6): 25 ug via INTRAVENOUS

## 2015-08-17 MED ORDER — FENTANYL CITRATE (PF) 100 MCG/2ML IJ SOLN
INTRAMUSCULAR | Status: DC | PRN
  Administered 2015-08-17 (×2): 50 ug via INTRAVENOUS

## 2015-08-17 MED ORDER — ONDANSETRON HCL 4 MG/2ML IJ SOLN
INTRAMUSCULAR | Status: DC | PRN
  Administered 2015-08-17: 4 mg via INTRAVENOUS

## 2015-08-17 MED ORDER — FENTANYL CITRATE (PF) 100 MCG/2ML IJ SOLN: 25.0000 ug | INTRAMUSCULAR | Status: DC | PRN

## 2015-08-17 MED ORDER — ONDANSETRON HCL 4 MG/2ML IJ SOLN: 4.0000 mg | Freq: Once | INTRAMUSCULAR | Status: DC | PRN

## 2015-08-17 MED ORDER — KETOROLAC TROMETHAMINE 30 MG/ML IJ SOLN
INTRAMUSCULAR | Status: DC | PRN
  Administered 2015-08-17: 30 mg via INTRAVENOUS

## 2015-08-17 MED ORDER — HYDRALAZINE HCL 20 MG/ML IJ SOLN
INTRAMUSCULAR | Status: AC
  Filled 2015-08-17: qty 1

## 2015-08-17 SURGICAL SUPPLY — 33 items
BANDAGE ELASTIC 6 CLIP NS LF (GAUZE/BANDAGES/DRESSINGS) ×3 IMPLANT
BLADE SAGITTAL WIDE XTHICK NO (BLADE) ×3 IMPLANT
BNDG COHESIVE 4X5 TAN STRL (GAUZE/BANDAGES/DRESSINGS) ×3 IMPLANT
BNDG GAUZE 4.5X4.1 6PLY STRL (MISCELLANEOUS) ×6 IMPLANT
BRUSH SCRUB 4% CHG (MISCELLANEOUS) ×3 IMPLANT
CANISTER SUCT 1200ML W/VALVE (MISCELLANEOUS) ×3 IMPLANT
DRAIN PENROSE 1/4X12 LTX (DRAIN) IMPLANT
DURAPREP 26ML APPLICATOR (WOUND CARE) ×6 IMPLANT
ELECT CAUTERY BLADE 6.4 (BLADE) ×3 IMPLANT
GAUZE PETRO XEROFOAM 1X8 (MISCELLANEOUS) ×6 IMPLANT
GLOVE BIO SURGEON STRL SZ7 (GLOVE) ×3 IMPLANT
GOWN STRL REUS W/ TWL LRG LVL3 (GOWN DISPOSABLE) ×1 IMPLANT
GOWN STRL REUS W/ TWL XL LVL3 (GOWN DISPOSABLE) ×1 IMPLANT
GOWN STRL REUS W/TWL LRG LVL3 (GOWN DISPOSABLE) ×2
GOWN STRL REUS W/TWL XL LVL3 (GOWN DISPOSABLE) ×2
HANDLE YANKAUER SUCT BULB TIP (MISCELLANEOUS) ×3 IMPLANT
KIT RM TURNOVER STRD PROC AR (KITS) ×3 IMPLANT
LABEL OR SOLS (LABEL) ×3 IMPLANT
NS IRRIG 1000ML POUR BTL (IV SOLUTION) ×3 IMPLANT
PACK EXTREMITY ARMC (MISCELLANEOUS) ×3 IMPLANT
PAD ABD DERMACEA PRESS 5X9 (GAUZE/BANDAGES/DRESSINGS) ×6 IMPLANT
PAD GROUND ADULT SPLIT (MISCELLANEOUS) ×3 IMPLANT
PAD PREP 24X41 OB/GYN DISP (PERSONAL CARE ITEMS) ×3 IMPLANT
SPONGE LAP 18X18 5 PK (GAUZE/BANDAGES/DRESSINGS) ×6 IMPLANT
STAPLER SKIN PROX 35W (STAPLE) ×6 IMPLANT
STOCKINETTE M/LG 89821 (MISCELLANEOUS) ×3 IMPLANT
SUT SILK 2 0 (SUTURE) ×2
SUT SILK 2 0 SH (SUTURE) ×6 IMPLANT
SUT SILK 2-0 18XBRD TIE 12 (SUTURE) ×1 IMPLANT
SUT SILK 3 0 (SUTURE) ×2
SUT SILK 3-0 18XBRD TIE 12 (SUTURE) ×1 IMPLANT
SUT VIC AB 0 CT1 36 (SUTURE) ×9 IMPLANT
SUT VIC AB 2-0 CT1 (SUTURE) ×6 IMPLANT

## 2015-08-17 NOTE — Anesthesia Preprocedure Evaluation (Addendum)
Anesthesia Evaluation  Patient identified by MRN, date of birth, ID band Patient awake    Reviewed: Allergy & Precautions, NPO status , Patient's Chart, lab work & pertinent test results, reviewed documented beta blocker date and time   Airway Mallampati: III  TM Distance: >3 FB     Dental  (+) Chipped   Pulmonary sleep apnea and Continuous Positive Airway Pressure Ventilation , former smoker,    Pulmonary exam normal        Cardiovascular hypertension, Pt. on medications and Pt. on home beta blockers Normal cardiovascular exam+ dysrhythmias Atrial Fibrillation      Neuro/Psych    GI/Hepatic hiatal hernia,   Endo/Other  diabetes, Type 2  Renal/GU      Musculoskeletal  (+) Arthritis ,   Abdominal   Peds  Hematology  (+) anemia ,   Anesthesia Other Findings Will use cpap tonite.  Reproductive/Obstetrics                            Anesthesia Physical Anesthesia Plan  ASA: III  Anesthesia Plan: General   Post-op Pain Management:    Induction: Intravenous  Airway Management Planned: LMA  Additional Equipment:   Intra-op Plan:   Post-operative Plan:   Informed Consent: I have reviewed the patients History and Physical, chart, labs and discussed the procedure including the risks, benefits and alternatives for the proposed anesthesia with the patient or authorized representative who has indicated his/her understanding and acceptance.     Plan Discussed with: CRNA  Anesthesia Plan Comments:         Anesthesia Quick Evaluation

## 2015-08-17 NOTE — Progress Notes (Signed)
CSW met with patient today to discuss possible need for  SNF- surery pending later today.. Full assessment to follow-  Eduard Clos, MSW, [place]

## 2015-08-17 NOTE — Anesthesia Procedure Notes (Signed)
Procedure Name: LMA Insertion Date/Time: 08/17/2015 3:57 PM Performed by: Jonna Clark Pre-anesthesia Checklist: Patient identified, Patient being monitored, Timeout performed, Emergency Drugs available and Suction available Patient Re-evaluated:Patient Re-evaluated prior to inductionOxygen Delivery Method: Circle system utilized Preoxygenation: Pre-oxygenation with 100% oxygen Intubation Type: IV induction Ventilation: Mask ventilation without difficulty LMA: LMA inserted LMA Size: 3.5 Tube type: Oral Number of attempts: 1 Placement Confirmation: positive ETCO2 and breath sounds checked- equal and bilateral Tube secured with: Tape Dental Injury: Teeth and Oropharynx as per pre-operative assessment

## 2015-08-17 NOTE — Progress Notes (Signed)
Patient cooperative during shift.  Denies any pain.  Dressing changed bilaterally.  Patient off unit for Left Below Knee Amputation.  Seems to be accepting loss of leg well.

## 2015-08-17 NOTE — Progress Notes (Signed)
Patient blood pressure continues to be elevated.  Gave po meds with small sip of water this am and MD added another po afterwards.  Paged Dr. Anselm Jungling to see if I could get a IV dose since already done one small sip of water today and patient is going for surgery.

## 2015-08-17 NOTE — Progress Notes (Signed)
[facility] [place] NAME: [name]    MR#:  [id number]  DATE OF BIRTH:  24-Feb-1951  SUBJECTIVE:  Patient is having loose stools. c diff negative. Patient denies melanotic stools or hematochezia. No new complains. Waiting for amputation later today.  REVIEW OF SYSTEMS:    Review of Systems  Constitutional: Negative for fever, chills and malaise/fatigue.  HENT: Negative for sore throat.   Eyes: Negative for blurred vision.  Respiratory: Negative for cough, hemoptysis, shortness of breath and wheezing.   Cardiovascular: Negative for palpitations and leg swelling.  Gastrointestinal: Positive for diarrhea. Negative for nausea, vomiting, abdominal pain and blood in stool.  Genitourinary: Negative for dysuria.  Musculoskeletal: Negative for back pain.  Skin:       Left foot cold and blue  Neurological: Negative for dizziness, tremors and headaches.  Endo/Heme/Allergies: Does not bruise/bleed easily.   Tolerating Diet:yes   DRUG ALLERGIES:   Allergies  Allergen Reactions  . Codeine Nausea And Vomiting  . Tape Rash    VITALS:  Blood pressure 175/74, pulse 72, temperature 98.9 F (37.2 C), temperature source Oral, resp. rate 16, height 5\' 2"  (1.575 m), weight 69.945 kg (154 lb 3.2 oz), SpO2 96 %.  PHYSICAL EXAMINATION:   Physical Exam  Constitutional: She is oriented to person, place, and time and well-developed, well-nourished, and in no distress. No distress.  HENT:  Head: Normocephalic.  Eyes: No scleral icterus.  Neck: Normal range of motion. Neck supple. No JVD present. No tracheal deviation present.  Cardiovascular: Normal rate, regular rhythm and normal heart sounds.  Exam reveals no gallop and no friction rub.   No murmur heard. Pulmonary/Chest: Effort normal and breath sounds normal. No respiratory distress. She has no wheezes. She has no rales. She exhibits no tenderness.  Abdominal: Soft. Bowel sounds are  normal. She exhibits no distension and no mass. There is no tenderness. There is no rebound and no guarding.  Musculoskeletal: Normal range of motion. She exhibits no edema.  Neurological: She is alert and oriented to person, place, and time.  Skin: Skin is warm. No erythema.  Left foot is cold to touch and blue, left toe- is black.  Psychiatric: Affect and judgment normal.    LABORATORY PANEL:   CBC  Recent Labs Lab 08/16/15 0259  WBC 16.1*  HGB 8.8*  HCT 27.3*  PLT 453*   ------------------------------------------------------------------------------------------------------------------  Chemistries   Recent Labs Lab 08/14/15 0004  08/16/15 0259  NA 130*  < > 133*  K 3.8  < > 4.0  CL 97*  < > 103  CO2 24  < > 22  GLUCOSE 178*  < > 95  BUN 16  < > 9  CREATININE 1.19*  < > 1.02*  CALCIUM 8.1*  < > 8.3*  AST 29  --   --   ALT 34  --   --   ALKPHOS 78  --   --   BILITOT 0.4  --   --   < > = values in this interval not displayed. ------------------------------------------------------------------------------------------------------------------  Cardiac Enzymes No results for input(s): TROPONINI in the last 168 hours. ------------------------------------------------------------------------------------------------------------------  RADIOLOGY:  No results found.   ASSESSMENT AND PLAN:   64 year old female with peripheral arterial disease status post PCI for left lower extremity who presented with left foot cellulitis with gangrene and sepsis.  1. Sepsis: Patient  had group B strep in blood culture during recent admission. She presented with fever  and leukocytosis this admission. Sepsis is likely from her gangrenous foot.  She is currently on Zosyn and vancomycin . ID has been consulted. Blood cultures are negative to date, may not need many days after amputation.  2. Left foot gangrene: Patient is status post left lower extremity angioplasty and during last  hospitalization she did not want amputation. She presents with fever and now says she would like a BKA.  Scheduled later today.  3. Peripheral arterial disease: Status post PCI. Continue aspirin and statin  4. Hyponatremia:  Resolving likely secondary to dehydration as sodium level improved with IV fluids.  5. Type 2 diabetes:  Continue sliding scale insulin, ADA diet , Amaryl and Levemir.  6. Chronic atrial fibrillation: Patient's heart rate is controlled on sotalol.   7. Anemia of chronic disease: Patient's hemoglobin  has dropped to 7.0 , transfused 1 unit of PRBC.   8. Diarrhea: negative C. difficile and add probiotic to her medications.   Likely due to antibiotics.  Will need PT after amputation.   Management plans discussed with the patient she is in agreement.  CODE STATUS: FULL  TOTAL TIME TAKING CARE OF THIS PATIENT: 30 minutes.    POSSIBLE D/C 2-3 days, DEPENDING ON CLINICAL CONDITION.   [name] M.D on 08/17/2015 at 3:18 PM  Between 7am to 6pm - Pager - [phone] After 6pm go to [email or web address] - password [email or web address] [facility]  Office  [phone]  CC: Primary care physician; [name], DO

## 2015-08-17 NOTE — Transfer of Care (Signed)
Immediate Anesthesia Transfer of Care Note  Patient: [name]  Procedure(s) Performed: Procedure(s): AMPUTATION BELOW KNEE (Left)  Patient Location: PACU  Anesthesia Type:General  Level of Consciousness: sedated and responds to stimulation  Airway & Oxygen Therapy: Patient Spontanous Breathing and Patient connected to face mask oxygen  Post-op Assessment: Report given to RN and Post -op Vital signs reviewed and stable  Post vital signs: Reviewed and stable  Last Vitals:  Filed Vitals:   08/17/15 1717  BP: 134/57  Pulse: 83  Temp: 37.1 C  Resp: 20    Complications: No apparent anesthesia complications

## 2015-08-17 NOTE — Op Note (Signed)
   OPERATIVE NOTE   PROCEDURE: left below-the-knee amputation  PRE-OPERATIVE DIAGNOSIS: PAD, left foot gangrene  POST-OPERATIVE DIAGNOSIS: same as above  SURGEON: Leotis Pain, MD  ASSISTANT(S): none  ANESTHESIA: general  ESTIMATED BLOOD LOSS: 250 cc  FINDING(S): none  SPECIMEN(S):  left below-the-knee amputation  INDICATIONS:   [name] is a 64 y.o. female who presents with left foot gangrene.  The patient is scheduled for a left below-the-knee amputation.  I discussed in depth with the patient the risks, benefits, and alternatives to this procedure.  The patient is aware that the risk of this operation included but are not limited to:  bleeding, infection, myocardial infarction, stroke, death, failure to heal amputation wound, and possible need for more proximal amputation.  The patient is aware of the risks and agrees proceed forward with the procedure.  DESCRIPTION:  After full informed written consent was obtained from the patient, the patient was brought back to the operating room, and placed supine upon the operating table.  Prior to induction, the patient received IV antibiotics.  The patient was then prepped and draped in the standard fashion for a below-the-knee amputation.  After obtaining adequate anesthesia, the patient was prepped and draped in the standard fashion for a below-the-knee amputation.  I marked out the anterior incision two finger breadths below the tibial tuberosity and then the marked out a posterior flap that was one third of the circumference of the calf in length.   I made the incisions for these flaps, and then dissected through the subcutaneous tissue, fascia, and muscle anteriorly.  I elevated  the periosteal tissue superiorly so that the tibia was about 3-4 cm shorter than the anterior skin flap.  I then transected the tibia with a power saw and then took a wedge off the tibia anteriorly with the power saw.  Then I smoothed out the rough edges.  In  a similar fashion, I cut back the fibula about two centimeters higher than the level of the tibia with a bone cutter.  I put a bone hook into the distal tibia and then used a large amputation knife to sharply develop a tissue plane through the muscle along the fibula.  In such fashion, the posterior flap was developed.  At this point, the specimen was passed off the field as the below-the-knee amputation.  At this point, I clamped all visibly bleeding arteries and veins using a combination of suture ligation with Silk suture and electrocautery.  Bleeding continued to be controlled with electrocautery and suture ligature.  The stump was washed off with sterile normal saline and no further active bleeding was noted.  I reapproximated the anterior and posterior fascia  with interrupted stitches of 0 Vicryl.  This was completed along the entire length of anterior and posterior fascia until there were no more loose space in the fascial line. I then placed a layer of 2-0 Vicryl sutures in the subcutaneous tissue. The skin was then  reapproximated with staples.  The stump was washed off and dried.  The incision was dressed with Xeroform and  then fluffs were applied.  Kerlix was wrapped around the leg and then gently an ACE wrap was applied.    COMPLICATIONS: None  CONDITION: stable   Grayton Lobo  08/17/2015, 5:12 PM

## 2015-08-17 NOTE — Consult Note (Signed)
ANTIBIOTIC CONSULT NOTE - FOLLOW UP  Pharmacy Consult for vancomycin Indication: sepsis/gangreen osteomylitis?  Allergies  Allergen Reactions  . Codeine Nausea And Vomiting  . Tape Rash    Patient Measurements: Height: 5\' 2"  (157.5 cm) Weight: 154 lb 3.2 oz (69.945 kg) IBW/kg (Calculated) : 50.1 Adjusted Body Weight:   Vital Signs: Temp: 98.9 F (37.2 C) (09/12 1036) Temp Source: Oral (09/12 1036) BP: 175/74 mmHg (09/12 1036) Pulse Rate: 72 (09/12 1036) Intake/Output from previous day: 09/11 0701 - 09/12 0700 In: 3229.3 [P.O.:760; I.V.:2369.3; IV Piggyback:100] Out: 1200 [Urine:1200] Intake/Output from this shift: Total I/O In: 431.7 [I.V.:431.7] Out: -   Labs:  Recent Labs  08/15/15 0332 08/15/15 1554 08/16/15 0259  WBC 15.2*  --  16.1*  HGB 7.0* 8.4* 8.8*  PLT 441*  --  453*  CREATININE 1.04*  --  1.02*   Estimated Creatinine Clearance: 49.7 mL/min (by C-G formula based on Cr of 1.02).  Recent Labs  08/17/15 0447 08/17/15 1216  [place] 16     Microbiology: Recent Results (from the past 720 hour(s))  Culture, blood (routine x 2)     Status: None   Collection Time: 08/03/15  5:12 PM  Result Value Ref Range Status   Specimen Description BLOOD RIGHT ARM  Final   Special Requests BOTTLES DRAWN AEROBIC AND ANAEROBIC [place]  Final   Culture NO GROWTH 5 DAYS  Final   Report Status 08/08/2015 FINAL  Final  Culture, blood (routine x 2)     Status: None   Collection Time: 08/03/15  5:15 PM  Result Value Ref Range Status   Specimen Description BLOOD LEFT ARM  Final   Special Requests BOTTLES DRAWN AEROBIC AND ANAEROBIC 4CC  Final   Culture  Setup Time   Final    GRAM POSITIVE COCCI IN PAIRS IN CHAINS AEROBIC BOTTLE ONLY CRITICAL RESULT CALLED TO, READ BACK BY AND VERIFIED WITH: MICHAEL JACOBS 08/04/15 1320 JGF    Culture   Final    GROUP B STREP(S.AGALACTIAE)ISOLATED STREPTOCOCCUS SANGUINIS AEROBIC BOTTLE ONLY Virtually 100% of S. agalactiae (Group  B) strains are susceptible to Penicillin.  For Penicillin-allergic patients, Erythromycin (85-95% sensitive) and Clindamycin (80% sensitive) are drugs of choice. Contact microbiology lab to request sensitivities if  needed within 7 days. ORG 2 SUGGESTIVE OF CONTAMINATION    Report Status 08/07/2015 FINAL  Final   Organism ID, Bacteria GROUP B STREP(S.AGALACTIAE)ISOLATED  Final      Susceptibility   Group b strep(s.agalactiae)isolated - MIC*    CLINDAMYCIN SENSITIVE Sensitive     CIPROFLOXACIN SENSITIVE Sensitive     VANCOMYCIN SENSITIVE Sensitive     ERYTHROMYCIN SENSITIVE Sensitive     * GROUP B STREP(S.AGALACTIAE)ISOLATED  Urine culture     Status: None   Collection Time: 08/03/15  6:10 PM  Result Value Ref Range Status   Specimen Description URINE, RANDOM  Final   Special Requests NONE  Final   Culture MULTIPLE SPECIES PRESENT, SUGGEST RECOLLECTION  Final   Report Status 08/05/2015 FINAL  Final  Wound culture     Status: None   Collection Time: 08/04/15  6:00 PM  Result Value Ref Range Status   Specimen Description FOOT  Final   Special Requests Immunocompromised  Final   Gram Stain FEW WBC SEEN MODERATE GRAM NEGATIVE RODS   Final   Culture NO GROWTH 4 DAYS  Final   Report Status 08/09/2015 FINAL  Final  C difficile quick scan w PCR reflex  Status: None   Collection Time: 08/07/15 11:38 AM  Result Value Ref Range Status   C Diff antigen NEGATIVE NEGATIVE Final   C Diff toxin NEGATIVE NEGATIVE Final   C Diff interpretation Negative for C. difficile  Final  Culture, blood (routine x 2)     Status: None   Collection Time: 08/09/15  9:12 AM  Result Value Ref Range Status   Specimen Description BLOOD RIGHT FATTY CASTS  Final   Special Requests BOTTLES DRAWN AEROBIC AND ANAEROBIC  12CC  Final   Culture NO GROWTH 5 DAYS  Final   Report Status 08/14/2015 FINAL  Final  Culture, blood (routine x 2)     Status: None   Collection Time: 08/09/15  9:22 AM  Result Value Ref Range  Status   Specimen Description BLOOD LEFT ASSIST CONTROL  Final   Special Requests BOTTLES DRAWN AEROBIC AND ANAEROBIC  12CC  Final   Culture NO GROWTH 5 DAYS  Final   Report Status 08/14/2015 FINAL  Final  Culture, blood (routine x 2)     Status: None (Preliminary result)   Collection Time: 08/14/15 12:04 AM  Result Value Ref Range Status   Specimen Description BLOOD RIGHT ARM  Final   Special Requests BOTTLES DRAWN AEROBIC AND ANAEROBIC 5ML  Final   Culture NO GROWTH 3 DAYS  Final   Report Status PENDING  Incomplete  Culture, blood (routine x 2)     Status: None (Preliminary result)   Collection Time: 08/14/15 12:49 AM  Result Value Ref Range Status   Specimen Description BLOOD RIGHT ANTECUBITAL  Final   Special Requests BOTTLES DRAWN AEROBIC AND ANAEROBIC 5ML  Final   Culture NO GROWTH 3 DAYS  Final   Report Status PENDING  Incomplete  C difficile quick scan w PCR reflex     Status: None   Collection Time: 08/15/15  3:37 PM  Result Value Ref Range Status   C Diff antigen NEGATIVE NEGATIVE Final   C Diff toxin NEGATIVE NEGATIVE Final   C Diff interpretation Negative for C. difficile  Final  MRSA PCR Screening     Status: None   Collection Time: 08/16/15  9:32 PM  Result Value Ref Range Status   MRSA by PCR NEGATIVE NEGATIVE Final    Comment:        The GeneXpert MRSA Assay (FDA approved for NASAL specimens only), is one component of a comprehensive MRSA colonization surveillance program. It is not intended to diagnose MRSA infection nor to guide or monitor treatment for MRSA infections.     Anti-infectives    Start     Dose/Rate Route Frequency Ordered Stop   08/14/15 1300  vancomycin (VANCOCIN) IVPB 1000 mg/200 mL premix     1,000 mg 200 mL/hr over 60 Minutes Intravenous Every 24 hours 08/14/15 0137     08/14/15 0145  piperacillin-tazobactam (ZOSYN) IVPB 4.5 g     4.5 g 200 mL/hr over 30 Minutes Intravenous 3 times per day 08/14/15 0137     08/14/15 0030   vancomycin (VANCOCIN) IVPB 1000 mg/200 mL premix     1,000 mg 200 mL/hr over 60 Minutes Intravenous  Once 08/14/15 0018 08/14/15 0203      Assessment: Vanc trough = 16 therapeutic  Goal of Therapy:  Vancomycin trough level 15-20 mcg/ml  Plan:  Continue vancomycin 1000mg  q 24 hours  Ramond Dial 08/17/2015,3:04 PM

## 2015-08-18 ENCOUNTER — Encounter: Payer: Self-pay | Admitting: Vascular Surgery

## 2015-08-18 LAB — CBC
HEMATOCRIT: 24.5 % — AB (ref 35.0–47.0)
Hemoglobin: 7.9 g/dL — ABNORMAL LOW (ref 12.0–16.0)
MCH: 25.7 pg — ABNORMAL LOW (ref 26.0–34.0)
MCHC: 32.2 g/dL (ref 32.0–36.0)
MCV: 79.8 fL — ABNORMAL LOW (ref 80.0–100.0)
PLATELETS: 441 10*3/uL — AB (ref 150–440)
RBC: 3.07 MIL/uL — ABNORMAL LOW (ref 3.80–5.20)
RDW: 14.6 % — AB (ref 11.5–14.5)
WBC: 20.3 10*3/uL — AB (ref 3.6–11.0)

## 2015-08-18 LAB — BASIC METABOLIC PANEL
ANION GAP: 7 (ref 5–15)
BUN: 11 mg/dL (ref 6–20)
CALCIUM: 7.6 mg/dL — AB (ref 8.9–10.3)
CO2: 23 mmol/L (ref 22–32)
CREATININE: 0.98 mg/dL (ref 0.44–1.00)
Chloride: 105 mmol/L (ref 101–111)
GFR, EST NON AFRICAN AMERICAN: 59 mL/min — AB (ref >60)
Glucose, Bld: 121 mg/dL — ABNORMAL HIGH (ref 65–99)
Potassium: 3.4 mmol/L — ABNORMAL LOW (ref 3.5–5.1)
SODIUM: 135 mmol/L (ref 135–145)

## 2015-08-18 LAB — GLUCOSE, CAPILLARY
GLUCOSE-CAPILLARY: 132 mg/dL — AB (ref 65–99)
GLUCOSE-CAPILLARY: 108 mg/dL — AB (ref 65–99)
Glucose-Capillary: 148 mg/dL — ABNORMAL HIGH (ref 65–99)
Glucose-Capillary: 201 mg/dL — ABNORMAL HIGH (ref 65–99)

## 2015-08-18 MED ORDER — SODIUM CHLORIDE 0.9 % IV SOLN
3.0000 g | Freq: Four times a day (QID) | INTRAVENOUS | Status: DC
  Administered 2015-08-18 – 2015-08-19 (×4): 3 g via INTRAVENOUS
  Filled 2015-08-18 (×9): qty 3

## 2015-08-18 MED ORDER — ASPIRIN 81 MG PO CHEW
81.0000 mg | CHEWABLE_TABLET | Freq: Every day | ORAL | Status: DC
  Administered 2015-08-18 – 2015-08-20 (×3): 81 mg via ORAL
  Filled 2015-08-18 (×3): qty 1

## 2015-08-18 MED ORDER — PIPERACILLIN-TAZOBACTAM 4.5 G IVPB
4.5000 g | Freq: Three times a day (TID) | INTRAVENOUS | Status: DC
  Filled 2015-08-18 (×4): qty 100

## 2015-08-18 MED ORDER — ENOXAPARIN SODIUM 40 MG/0.4ML ~~LOC~~ SOLN
40.0000 mg | SUBCUTANEOUS | Status: DC
  Administered 2015-08-18 – 2015-08-19 (×2): 40 mg via SUBCUTANEOUS
  Filled 2015-08-18 (×2): qty 0.4

## 2015-08-18 MED ORDER — POTASSIUM CHLORIDE CRYS ER 20 MEQ PO TBCR
20.0000 meq | EXTENDED_RELEASE_TABLET | Freq: Two times a day (BID) | ORAL | Status: AC
  Administered 2015-08-18 – 2015-08-19 (×3): 20 meq via ORAL
  Filled 2015-08-18 (×3): qty 1

## 2015-08-18 NOTE — Progress Notes (Signed)
Pts. Dressing changed to right foot. Medicated for pain before dressing change per pt. Request.

## 2015-08-18 NOTE — Progress Notes (Signed)
POD 1. Pt. Alert and oriented. VSS. Pain in left leg controlled with meds per MAR. Dressing on left stump clean dry and intact. Dressing to right foot changed. Pt. Using bedpan. Pt. Receiving IV antibiotics. Tolerating PO's. Resting quietly with family at the bedside. Will continue to monitor.

## 2015-08-18 NOTE — Evaluation (Signed)
Physical Therapy Evaluation Patient Details Name: [name] MRN: [id number] DOB: [birth year] Today's Date: 08/18/2015   History of Present Illness  Pt is a 64 yo female who was admitted to the hospital s/p  L BKA on 08/17/15. Pt was recently d/c'd from the hospital secondary to LLE cellulitis   Clinical Impression  Pt presents with hx of HTN, anemia, DM, A-fib, and neuropathy. Examination reveals that pt performs bed mobility at min assist, transfers at mod assist +2, and ambulation at mod assist +2. Pt shows fairly good strength in UEs but has endurance issues that need to be addressed. Pt reports phantom pain in LLE. Pt will continue to benefit from skilled PT in order for her to return to premorbid state. She shows a great deal of motivation, and has been issued an exercise packet. Pt's O2 sat at rest on room air was 95%, and following ambulation her O2 sat was 96%. She was therefore left off of O2 [place]. Nurse notified.     Follow Up Recommendations SNF    Equipment Recommendations       Recommendations for Other Services       Precautions / Restrictions Precautions Precautions: Fall Restrictions Weight Bearing Restrictions: No      Mobility  Bed Mobility Overal bed mobility: Needs Assistance Bed Mobility: Supine to Sit     Supine to sit: Min assist     General bed mobility comments: Pt with good use of siderails and demonstrating good functional strength in RLE in managing bed mobility. Needs assist for trunk management yet.  Transfers Overall transfer level: Needs assistance Equipment used: Rolling walker (2 wheeled) Transfers: Sit to/from Stand Sit to Stand: Mod assist;+2 safety/equipment         General transfer comment: Pt requiring +2 for safety. Pt performed 5 sit-to-stands with the first transfer requiring mod-max assist, however with practice pt can perform transfer at min assist. Needs cues for hand placement. No unsteadiness, LOB or buckling during  transfers  Ambulation/Gait Ambulation/Gait assistance: Mod assist;+2 physical assistance Ambulation Distance (Feet): 3 Feet Assistive device: Rolling walker (2 wheeled) Gait Pattern/deviations:  (Hop with RW ) Gait velocity: decreased Gait velocity interpretation: Below normal speed for age/gender General Gait Details: Pt able to hop with RW by pushing down into hand rests. Needs cues for hopping and turning of RW. Pt fatigues after a few ft ambulation. Have chair ready  Stairs            Wheelchair Mobility    Modified Rankin (Stroke Patients Only)       Balance Overall balance assessment: Needs assistance Sitting-balance support: No upper extremity supported Sitting balance-Leahy Scale: Good     Standing balance support: Bilateral upper extremity supported (Single LE support) Standing balance-Leahy Scale: Fair                               Pertinent Vitals/Pain Pain Assessment: 0-10 Pain Score: 3  Pain Location: Phantom pain where LLE was  Pain Intervention(s): Limited activity within patient's tolerance;Monitored during session;Premedicated before session    [place] expects to be discharged to:: Private residence Living Arrangements: Alone Available Help at Discharge: Family;Friend(s) (no 24 hr assistance ) Type of Home: House Home Access: Stairs to enter Entrance Stairs-Rails: Can reach both Entrance Stairs-Number of Steps: 5 from front with B railing or 4 from garage with L railing (ramp being built) Home Layout: One level  Prior Function Level of Independence: Independent         Comments: Pt was independent with ADLs and ambulation      Hand Dominance        Extremity/Trunk Assessment   Upper Extremity Assessment: Overall WFL for tasks assessed           Lower Extremity Assessment: LLE deficits/detail   LLE Deficits / Details: L gross knee and hip MMT is 4/5     Communication   Communication:  No difficulties  Cognition Arousal/Alertness: Awake/alert Behavior During Therapy: WFL for tasks assessed/performed Overall Cognitive Status: Within Functional Limits for tasks assessed                      General Comments      Exercises Other Exercises Other Exercises: Pt performed bilateral therex x 12 reps at supervision for proper technique. Exercises performed: quad sets, hamstring curls over pillow, SLR, hip abd, and glute sets      Assessment/Plan    PT Assessment Patient needs continued PT services  PT Diagnosis Difficulty walking;Acute pain;Abnormality of gait   PT Problem List Decreased strength;Decreased balance;Decreased mobility;Decreased knowledge of use of DME;Decreased coordination;Pain  PT Treatment Interventions DME instruction;Gait training;Stair training;Functional mobility training;Therapeutic activities;Therapeutic exercise;Balance training;Neuromuscular re-education;Patient/family education;Wheelchair mobility training;Manual techniques   PT Goals (Current goals can be found in the Care Plan section) Acute Rehab PT Goals Patient Stated Goal: To go home PT Goal Formulation: With patient Time For Goal Achievement: 09/01/15 Potential to Achieve Goals: Good    Frequency 7X/week   Barriers to discharge   no 24 hr assistance     Co-evaluation               End of Session Equipment Utilized During Treatment: Gait belt Activity Tolerance: Patient tolerated treatment well Patient left: in chair;with call bell/phone within reach;with chair alarm set;with nursing/sitter in room Nurse Communication: Mobility status;Weight bearing status         Time: 2130-8657 PT Time Calculation (min) (ACUTE ONLY): 33 min   Charges:         PT G CodesJanyth Contes September 16, 2015, 1:29 PM  Janyth Contes, SPT. [phone]

## 2015-08-18 NOTE — Progress Notes (Signed)
[place] [facility] [place] NAME: [name]    MR#:  [id number]  DATE OF BIRTH:  [birth year]  SUBJECTIVE:  Patient is having loose stools. c diff negative. Patient denies melanotic stools or hematochezia. No new complains. 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 C/o phantom pain now on left foot. Starting PT.  REVIEW OF SYSTEMS:    Review of Systems  Constitutional: Negative for fever, chills and malaise/fatigue.  HENT: Negative for sore throat.   Eyes: Negative for blurred vision.  Respiratory: Negative for cough, hemoptysis, shortness of breath and wheezing.   Cardiovascular: Negative for palpitations and leg swelling.  Gastrointestinal: Negative for nausea, vomiting, abdominal pain, diarrhea and blood in stool.  Genitourinary: Negative for dysuria.  Musculoskeletal: Negative for back pain.  Neurological: Negative for dizziness, tremors and headaches.  Endo/Heme/Allergies: Does not bruise/bleed easily.   Tolerating Diet:yes   DRUG ALLERGIES:   Allergies  Allergen Reactions  . Codeine Nausea And Vomiting  . Tape Rash    VITALS:  Blood pressure 122/53, pulse 56, temperature 98.3 F (36.8 C), temperature source Oral, resp. rate 18, height 5\' 2"  (1.575 m), weight 69.945 kg (154 lb 3.2 oz), SpO2 100 %.  PHYSICAL EXAMINATION:   Physical Exam  Constitutional: She is oriented to person, place, and time and well-developed, well-nourished, and in no distress. No distress.  HENT:  Head: Normocephalic.  Eyes: No scleral icterus.  Neck: Normal range of motion. Neck supple. No JVD present. No tracheal deviation present.  Cardiovascular: Normal rate, regular rhythm and normal heart sounds.  Exam reveals no gallop and no friction rub.   No murmur heard. Pulmonary/Chest: Effort normal and breath sounds normal. No respiratory distress. She has no wheezes. She has no rales. She exhibits no tenderness.  Abdominal: Soft. Bowel sounds are normal.  She exhibits no distension and no mass. There is no tenderness. There is no rebound and no guarding.  Musculoskeletal: Normal range of motion. She exhibits no edema.  Left BKA and dressing present.  Neurological: She is alert and oriented to person, place, and time.  Skin: Skin is warm. No erythema.  Psychiatric: Affect and judgment normal.    LABORATORY PANEL:   CBC  Recent Labs Lab 08/18/15 0400  WBC 20.3*  HGB 7.9*  HCT 24.5*  PLT 441*   ------------------------------------------------------------------------------------------------------------------  Chemistries   Recent Labs Lab 08/14/15 0004  08/18/15 0400  NA 130*  < > 135  K 3.8  < > 3.4*  CL 97*  < > 105  CO2 24  < > 23  GLUCOSE 178*  < > 121*  BUN 16  < > 11  CREATININE 1.19*  < > 0.98  CALCIUM 8.1*  < > 7.6*  AST 29  --   --   ALT 34  --   --   ALKPHOS 78  --   --   BILITOT 0.4  --   --   < > = values in this interval not displayed. ------------------------------------------------------------------------------------------------------------------  Cardiac Enzymes No results for input(s): TROPONINI in the last 168 hours. ------------------------------------------------------------------------------------------------------------------  RADIOLOGY:  No results found.   ASSESSMENT AND PLAN:   64 year old female with peripheral arterial disease status post PCI for left lower extremity who presented with left foot cellulitis with gangrene and sepsis.  1. Sepsis: Patient  had group B strep in blood culture during recent admission. She presented with fever and leukocytosis this admission. Sepsis is likely from her gangrenous foot. She was  on Zosyn and vancomycin . ID has been consulted. Blood cultures are negative to date, as per him- finished course for bacteremia,and now after amputation can swich and later stop ABx.  2. Left foot gangrene: Patient is status post left lower extremity angioplasty and during  last hospitalization she did not want amputation. She presents with fever s/p BKA done.08/17/15.  3. Peripheral arterial disease: Status post PCI. Continue aspirin and statin  4. Hyponatremia:  Resolving likely secondary to dehydration as sodium level improved with IV fluids.  5. Type 2 diabetes:  Continue sliding scale insulin, ADA diet , Amaryl and Levemir.  6. Chronic atrial fibrillation: Patient's heart rate is controlled on sotalol.   7. Anemia of chronic disease: Patient's hemoglobin  has dropped to 7.0 , transfused 1 unit of PRBC.   8. Diarrhea: negative C. difficile and add probiotic to her medications.   Likely due to antibiotics.  Will need PT after amputation.   Management plans discussed with the patient she is in agreement.  CODE STATUS: FULL  TOTAL TIME TAKING CARE OF THIS PATIENT: 30 minutes.    POSSIBLE D/C 2-3 days, DEPENDING ON CLINICAL CONDITION.   [name] M.D on 08/18/2015 at 9:02 PM  Between 7am to 6pm - Pager - [phone] After 6pm go to [email or web address] - password [email or web address] [facility]  Office  [phone]  CC: Primary care physician; [name], DO

## 2015-08-18 NOTE — Progress Notes (Signed)
[facility] INFECTIOUS DISE[facility] PROGRESS NOTE Date of Admission:  08/13/2015     ID: LYDIE STAMMEN is a 64 y.o. female with gangrene Principal Problem:   Sepsis  Subjective: S/p BKA 9/12  no new complaints  ROS  Eleven systems are reviewed and negative except per hpi  Medications:  Antibiotics Given (last 72 hours)    Date/Time Action Medication Dose Rate   08/15/15 1803 Given   piperacillin-tazobactam (ZOSYN) IVPB 4.5 g 4.5 g 200 mL/hr   08/16/15 0152 Given   piperacillin-tazobactam (ZOSYN) IVPB 4.5 g 4.5 g 200 mL/hr   08/16/15 4270 Given   piperacillin-tazobactam (ZOSYN) IVPB 4.5 g 4.5 g 200 mL/hr   08/16/15 1209 Given   vancomycin (VANCOCIN) IVPB 1000 mg/200 mL premix 1,000 mg 200 mL/hr   08/16/15 1712 Given   piperacillin-tazobactam (ZOSYN) IVPB 4.5 g 4.5 g 200 mL/hr   08/17/15 0249 Given   piperacillin-tazobactam (ZOSYN) IVPB 4.5 g 4.5 g 200 mL/hr   08/17/15 1030 Given   piperacillin-tazobactam (ZOSYN) IVPB 4.5 g 4.5 g 200 mL/hr   08/17/15 1418 Given   vancomycin (VANCOCIN) IVPB 1000 mg/200 mL premix 1,000 mg 200 mL/hr   08/17/15 1833 Given   piperacillin-tazobactam (ZOSYN) IVPB 4.5 g 4.5 g 200 mL/hr   08/18/15 0221 Given   piperacillin-tazobactam (ZOSYN) IVPB 4.5 g 4.5 g 200 mL/hr   08/18/15 0826 Given   piperacillin-tazobactam (ZOSYN) IVPB 4.5 g 4.5 g 200 mL/hr     . acidophilus  1 capsule Oral Daily  . ampicillin-sulbactam (UNASYN) IV  3 g Intravenous Q6H  . aspirin  81 mg Oral Daily  . atorvastatin  40 mg Oral QHS  . enoxaparin (LOVENOX) injection  40 mg Subcutaneous Q24H  . gabapentin  100 mg Oral TID  . hydrALAZINE  25 mg Oral 3 times per day  . insulin aspart  0-15 Units Subcutaneous TID WC  . insulin aspart  0-5 Units Subcutaneous QHS  . insulin detemir  6 Units Subcutaneous BID AC  . potassium chloride  20 mEq Oral BID  . pyridOXINE  100 mg Oral QHS  . sotalol  80 mg Oral BID  . vitamin E  400 Units Oral QHS    Objective: Vital signs in last  24 hours: Temp:  [97.5 F (36.4 C)-98.8 F (37.1 C)] 98.1 F (36.7 C) (09/13 0722) Pulse Rate:  [41-102] 61 (09/13 0722) Resp:  [16-20] 16 (09/13 0722) BP: (126-161)/(45-91) 142/53 mmHg (09/13 0722) SpO2:  [86 %-100 %] 100 % (09/13 0722) FiO2 (%):  [28 %] 28 % (09/12 1728) Constitutional: oriented to person, place, and time. appears well-developed chronically ill appearing HENT: [place]/AT, PERRLA, no scleral icterus Mouth/Throat: Oropharynx is clear and moist. No oropharyngeal exudate.  Cardiovascular: Normal rate, regular rhythm and normal heart sounds. Pulmonary/Chest: Effort normal and breath sounds normal. No respiratory distress. has no wheezes.  Neck = supple, no nuchal rigidity Abdominal: Soft. Bowel sounds are normal. exhibits no distension. There is no tenderness.  Lymphadenopathy: no cervical adenopathy. No axillary adenopathy Neurological: alert and oriented to person, place, and time.  Skin: L leg post op wrapped stump Psychiatric: a normal mood and affect. behavior is normal.   Lab Results  Recent Labs  08/16/15 0259 08/18/15 0400  WBC 16.1* 20.3*  HGB 8.8* 7.9*  HCT 27.3* 24.5*  NA 133* 135  K 4.0 3.4*  CL 103 105  CO2 22 23  BUN 9 11  CREATININE 1.02* 0.98    Microbiology: Results for orders placed  or performed during the hospital encounter of 08/13/15  Culture, blood (routine x 2)     Status: None (Preliminary result)   Collection Time: 08/14/15 12:04 AM  Result Value Ref Range Status   Specimen Description BLOOD RIGHT ARM  Final   Special Requests BOTTLES DRAWN AEROBIC AND ANAEROBIC 5ML  Final   Culture NO GROWTH 4 DAYS  Final   Report Status PENDING  Incomplete  Culture, blood (routine x 2)     Status: None (Preliminary result)   Collection Time: 08/14/15 12:49 AM  Result Value Ref Range Status   Specimen Description BLOOD RIGHT ANTECUBITAL  Final   Special Requests BOTTLES DRAWN AEROBIC AND ANAEROBIC 5ML  Final   Culture NO GROWTH 4 DAYS   Final   Report Status PENDING  Incomplete  C difficile quick scan w PCR reflex     Status: None   Collection Time: 08/15/15  3:37 PM  Result Value Ref Range Status   C Diff antigen NEGATIVE NEGATIVE Final   C Diff toxin NEGATIVE NEGATIVE Final   C Diff interpretation Negative for C. difficile  Final  MRSA PCR Screening     Status: None   Collection Time: 08/16/15  9:32 PM  Result Value Ref Range Status   MRSA by PCR NEGATIVE NEGATIVE Final    Comment:        The GeneXpert MRSA Assay (FDA approved for NASAL specimens only), is one component of a comprehensive MRSA colonization surveillance program. It is not intended to diagnose MRSA infection nor to guide or monitor treatment for MRSA infections.     Studies/Results: No results found.  Assessment/Plan: BRITTAINY BUCKER is a 64 y.o. female with very poorly controlled DM (A1C> 11), PN and PVD admitted with gangrenous changes of L foot and ulceration of R foot. She had revascularization. She had fevers and elevated wbc. Had been on IV abx with minimal improvement since angiogram. Bcx + for Grp B strep and viridans strep in 1/2 bcx (8/29) Wound cx negative.  Now s/p BKA 9/12  Recommendations Can change abx to usual post op abx for BKA - would change to ancef until dc Has finished course for her bacteremia Can fu with vascular for wound check. I do not need to see unless worsens Will sign off.  Please call if needed  [place], Finas Delone   08/18/2015, 2:11 PM

## 2015-08-18 NOTE — Care Management Important Message (Signed)
Important Message  Patient Details  Name: [name] MRN: [id number] Date of Birth: 11-11-1951   Medicare Important Message Given:  Yes-second notification given    [name] 08/18/2015, 11:07 AM

## 2015-08-18 NOTE — Progress Notes (Signed)
[facility]  Daily Progress Note   Subjective: 1 Day Post-Op: left below the knee amputation for left foot gangrene  Patient doing well, some pain at stump site - states she still "feels her big toe" otherwise without complaint. Patient will go to rehab when ready for discharge.   Objective: Filed Vitals:   08/17/15 2256 08/18/15 0422 08/18/15 0640 08/18/15 0722  BP: 153/53 161/91 140/72 142/53  Pulse: 67 63  61  Temp: 97.7 F (36.5 C) 97.6 F (36.4 C)  98.1 F (36.7 C)  TempSrc: Oral Oral  Oral  Resp: 18 18  16   Height:      Weight:      SpO2: 100% 100%  100%    Intake/Output Summary (Last 24 hours) at 08/18/15 0818 Last data filed at 08/18/15 1497  Gross per 24 hour  Intake 3140.01 ml  Output    350 ml  Net 2790.01 ml    Physical Exam: A&Ox3, NAD CV: RRR Pulmonary: CTA Bilaterally Abdomen: Soft, Nontender, Nondistended Vascular: Left Leg Extremity: OR dressing intact, clean and dry. Thigh soft.  Laboratory: CBC    Component Value Date/Time   WBC 20.3* 08/18/2015 0400   HGB 7.9* 08/18/2015 0400   HCT 24.5* 08/18/2015 0400   PLT 441* 08/18/2015 0400   BMET    Component Value Date/Time   NA 135 08/18/2015 0400   K 3.4* 08/18/2015 0400   CL 105 08/18/2015 0400   CO2 23 08/18/2015 0400   GLUCOSE 121* 08/18/2015 0400   BUN 11 08/18/2015 0400   CREATININE 0.98 08/18/2015 0400   CALCIUM 7.6* 08/18/2015 0400   GFRNONAA 59* 08/18/2015 0400   GFRAA >60 08/18/2015 0400   Assessment/Planning: 64 year old female s/p left BKA for left foot gangrene - POD#1 - stable 1) PT 2) Pain Control 3) Will change OR dressing POD 3-4 4) Care as per primary team  [employer] PA-C 08/18/2015 8:18 AM

## 2015-08-19 LAB — CBC WITH DIFFERENTIAL/PLATELET
Basophils Absolute: 0.2 10*3/uL — ABNORMAL HIGH (ref 0–0.1)
Basophils Relative: 1 %
Eosinophils Absolute: 0.2 10*3/uL (ref 0–0.7)
Eosinophils Relative: 1 %
HEMATOCRIT: 25.1 % — AB (ref 35.0–47.0)
HEMOGLOBIN: 8 g/dL — AB (ref 12.0–16.0)
LYMPHS ABS: 3.9 10*3/uL — AB (ref 1.0–3.6)
LYMPHS PCT: 25 %
MCH: 25.7 pg — AB (ref 26.0–34.0)
MCHC: 32 g/dL (ref 32.0–36.0)
MCV: 80.5 fL (ref 80.0–100.0)
MONOS PCT: 6 %
Monocytes Absolute: 0.9 10*3/uL (ref 0.2–0.9)
NEUTROS ABS: 10.3 10*3/uL — AB (ref 1.4–6.5)
NEUTROS PCT: 67 %
Platelets: 427 10*3/uL (ref 150–440)
RBC: 3.12 MIL/uL — AB (ref 3.80–5.20)
RDW: 14.7 % — ABNORMAL HIGH (ref 11.5–14.5)
WBC: 15.5 10*3/uL — AB (ref 3.6–11.0)

## 2015-08-19 LAB — GLUCOSE, CAPILLARY
GLUCOSE-CAPILLARY: 99 mg/dL (ref 65–99)
Glucose-Capillary: 113 mg/dL — ABNORMAL HIGH (ref 65–99)
Glucose-Capillary: 161 mg/dL — ABNORMAL HIGH (ref 65–99)
Glucose-Capillary: 156 mg/dL — ABNORMAL HIGH (ref 65–99)

## 2015-08-19 LAB — SURGICAL PATHOLOGY

## 2015-08-19 LAB — CULTURE, BLOOD (ROUTINE X 2)
CULTURE: NO GROWTH
CULTURE: NO GROWTH

## 2015-08-19 MED ORDER — CEPHALEXIN 500 MG PO CAPS
500.0000 mg | ORAL_CAPSULE | Freq: Three times a day (TID) | ORAL | Status: DC
  Administered 2015-08-19 – 2015-08-20 (×4): 500 mg via ORAL
  Filled 2015-08-19 (×4): qty 1

## 2015-08-19 NOTE — Progress Notes (Signed)
[facility]  Daily Progress Note  Subjective: 2 Days Post-Op: left below the knee amputation for left foot gangrene  Patient doing well, some pain at stump site - states she feels "phantom pain" otherwise without complaint. Patient will go to rehab when ready for discharge. Working with PT - patient seems motivated.   Objective: Filed Vitals:   08/19/15 0455 08/19/15 0539 08/19/15 0546 08/19/15 0725  BP: 135/55 127/44 129/49 127/53  Pulse: 52 60  52  Temp: 98.1 F (36.7 C) 98 F (36.7 C)  98.3 F (36.8 C)  TempSrc: Oral Oral  Oral  Resp: 18 18  18   Height:      Weight:      SpO2: 100% 100%  99%    Intake/Output Summary (Last 24 hours) at 08/19/15 0756 Last data filed at 08/18/15 1809  Gross per 24 hour  Intake 901.67 ml  Output      0 ml  Net 901.67 ml   Physical Exam: A&Ox3, NAD CV: RRR Pulmonary: CTA Bilaterally Abdomen: Soft, Nontender, Nondistended Vascular: Left Leg Extremity: OR dressing intact, clean and dry. Thigh soft.   Laboratory: CBC    Component Value Date/Time   WBC 20.3* 08/18/2015 0400   HGB 7.9* 08/18/2015 0400   HCT 24.5* 08/18/2015 0400   PLT 441* 08/18/2015 0400   BMET    Component Value Date/Time   NA 135 08/18/2015 0400   K 3.4* 08/18/2015 0400   CL 105 08/18/2015 0400   CO2 23 08/18/2015 0400   GLUCOSE 121* 08/18/2015 0400   BUN 11 08/18/2015 0400   CREATININE 0.98 08/18/2015 0400   CALCIUM 7.6* 08/18/2015 0400   GFRNONAA 59* 08/18/2015 0400   GFRAA >60 08/18/2015 0400    Assessment/Planning: 64 year old female s/p left BKA for left foot gangrene - POD#2 - stable 1) PT 2) Pain Control 3) Will change OR dressing Thursday 4) Care as per primary team  [facility] PA-C 08/19/2015 7:56 AM

## 2015-08-19 NOTE — Progress Notes (Signed)
Initial Nutrition Assessment   INTERVENTION:   Meals and Snacks: Cater to patient preferences Medical Food Supplement Therapy: pt not wanting supplements currently as they are 'too sweet' Coordination of Care: recommend new weight s/p amputation Education: RD emphasized importance of protein rich foods for wound healing as well as eating balanced meals to optimize nutritional intake and maintaining healthy blood sugars. Expect good compliance.   NUTRITION DIAGNOSIS:   Increased nutrient needs related to wound healing as evidenced by  (recent BKA 08/17/2015).  GOAL:   Patient will meet greater than or equal to 90% of their needs  MONITOR:    (Energy Intake, Anthropometrics, Glucose Profile)  REASON FOR ASSESSMENT:   LOS    ASSESSMENT:   Pt s/p left BKA amuptation secondary to gangrene of left foot on 08/17/2015, per pt likely discharge to rehab tomorrow.   Past Medical History  Diagnosis Date  . Sleep apnea     cpap  . Dysrhythmia     a fib  . Hypertension   . Neuropathy   . History of hiatal hernia   . Anemia   . Arthritis   . Diabetes mellitus without complication   . HOH (hard of hearing)     aids  . A-fib     Diet Order:  Diet regular Room service appropriate?: Yes; Fluid consistency:: Thin    Current Nutrition: Pt reports eating peaches, grilled cheese for lunch and eating yogurt, toast and grits for breakfast, 100%. Recorded po intake average 77% of meals since admission.   Food/Nutrition-Related History: Pt reports good appetite PTA eating 3 meals per day.    Medications: Novolog, Levemir, vitamin B6, Vitamin E  Electrolyte/Renal Profile and Glucose Profile:   Recent Labs Lab 08/15/15 0332 08/16/15 0259 08/18/15 0400  NA 134* 133* 135  K 3.8 4.0 3.4*  CL 105 103 105  CO2 23 22 23   BUN 11 9 11   CREATININE 1.04* 1.02* 0.98  CALCIUM 8.0* 8.3* 7.6*  GLUCOSE 83 95 121*   Protein Profile:  Recent Labs Lab 08/14/15 0004  ALBUMIN 2.0*     Gastrointestinal Profile: Last BM:  08/18/2015   Weight Change: Pt reports weight of 138.2lbs 2 weeks ago at outpatient MD office. RD notes current weight in [facility] pre-amputation. RD attempted to weigh pt in bed however not accurate, likely secondary to equipment now on bed.   Height:   Ht Readings from Last 1 Encounters:  08/14/15 5\' 2"  (1.575 m)    Weight:   Wt Readings from Last 1 Encounters:  08/14/15 154 lb 3.2 oz (69.945 kg)     BMI:  Body mass index is 28.2 kg/(m^2).   EDUCATION NEEDS:   No education needs identified at this time however RD did encourage good protein intake and healthy eating habits for wound healing.  LOW Care Level  Dwyane Luo, [place], [place] Pager [phone]

## 2015-08-19 NOTE — Progress Notes (Signed)
[place] at [place] NAME: [name]    MR#:  [id number]  DATE OF BIRTH:  02/01/1951  SUBJECTIVE:  No new complains. 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 C/o phantom pain now on left foot. Working with PT.  REVIEW OF SYSTEMS:    Review of Systems  Constitutional: Negative for fever, chills and malaise/fatigue.  HENT: Negative for sore throat.   Eyes: Negative for blurred vision.  Respiratory: Negative for cough, hemoptysis, shortness of breath and wheezing.   Cardiovascular: Negative for palpitations and leg swelling.  Gastrointestinal: Negative for nausea, vomiting, abdominal pain, diarrhea and blood in stool.  Genitourinary: Negative for dysuria.  Musculoskeletal: Negative for back pain.  Neurological: Negative for dizziness, tremors and headaches.  Endo/Heme/Allergies: Does not bruise/bleed easily.   Tolerating Diet:yes   DRUG ALLERGIES:   Allergies  Allergen Reactions  . Codeine Nausea And Vomiting  . Tape Rash    VITALS:  Blood pressure 124/75, pulse 75, temperature 98.3 F (36.8 C), temperature source Oral, resp. rate 18, height 5\' 2"  (1.575 m), weight 69.945 kg (154 lb 3.2 oz), SpO2 95 %.  PHYSICAL EXAMINATION:   Physical Exam  Constitutional: She is oriented to person, place, and time and well-developed, well-nourished, and in no distress. No distress.  HENT:  Head: Normocephalic.  Eyes: No scleral icterus.  Neck: Normal range of motion. Neck supple. No JVD present. No tracheal deviation present.  Cardiovascular: Normal rate, regular rhythm and normal heart sounds.  Exam reveals no gallop and no friction rub.   No murmur heard. Pulmonary/Chest: Effort normal and breath sounds normal. No respiratory distress. She has no wheezes. She has no rales. She exhibits no tenderness.  Abdominal: Soft. Bowel sounds are normal. She exhibits no distension and no mass. There is no tenderness. There is no rebound and no  guarding.  Musculoskeletal: Normal range of motion. She exhibits no edema.  Left BKA and dressing present.  Neurological: She is alert and oriented to person, place, and time.  Skin: Skin is warm. No erythema.  Psychiatric: Affect and judgment normal.   right foot dressing.  LABORATORY PANEL:   CBC  Recent Labs Lab 08/19/15 1030  WBC 15.5*  HGB 8.0*  HCT 25.1*  PLT 427   ------------------------------------------------------------------------------------------------------------------  Chemistries   Recent Labs Lab 08/14/15 0004  08/18/15 0400  NA 130*  < > 135  K 3.8  < > 3.4*  CL 97*  < > 105  CO2 24  < > 23  GLUCOSE 178*  < > 121*  BUN 16  < > 11  CREATININE 1.19*  < > 0.98  CALCIUM 8.1*  < > 7.6*  AST 29  --   --   ALT 34  --   --   ALKPHOS 78  --   --   BILITOT 0.4  --   --   < > = values in this interval not displayed. ------------------------------------------------------------------------------------------------------------------  Cardiac Enzymes No results for input(s): TROPONINI in the last 168 hours. ------------------------------------------------------------------------------------------------------------------  RADIOLOGY:  No results found.   ASSESSMENT AND PLAN:   64 year old female with peripheral arterial disease status post PCI for left lower extremity who presented with left foot cellulitis with gangrene and sepsis.  1. Sepsis: Patient  had group B strep in blood culture during recent admission. Sepsis is likely from her gangrenous foot. She was on Zosyn and vancomycin . ID has been consulted. Blood cultures are negative to date. Finished course for  bacteremia,and now after amputation stopped IV antibiotics and switched to Keflex orally.  2. 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 She presents with fever s/p BKA done.08/17/15.  3. Peripheral  arterial disease: Status post PCI. Continue aspirin and statin  4. Hyponatremia:  Resolving likely secondary to dehydration as sodium level improved with IV fluids.  5. Type 2 diabetes:  Continue sliding scale insulin, ADA diet , Amaryl and Levemir.  6. Chronic atrial fibrillation: Patient's heart rate is controlled on sotalol.   7. Anemia of chronic disease: Patient's hemoglobin  is stable  8. Diarrhea: negative C. difficile and add probiotic to her medications.   Management plans discussed with the patient she is in agreement.  CODE STATUS: FULL  TOTAL TIME TAKING CARE OF THIS PATIENT: 30 minutes.    POSSIBLE D/C tomorrow DEPENDING ON CLINICAL CONDITION. Discharge to skilled nursing facility after dressing change tomorrow.   Hillary Bow R M.D on 08/19/2015 at 12:54 PM  Between 7am to 6pm - Pager - [phone] After 6pm go to [email or web address] - password [email or web address] [facility]  Office  [phone]  CC: Primary care physician; [name], DO

## 2015-08-19 NOTE — Progress Notes (Signed)
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 Pt was recently d/c'd from the hospital secondary to LLE cellulitis     PT Comments    Patient continues to show excellent motivation and participation with therapy. She is able to complete bed mobility at modified independent level of assist and transfers with min A x1 and RW during this session, representing a significant improvement from previous PT session. Patient continues to be limited by fatigue and decreased UE strength to allow for longer distance hopping on RLE with RW. Patient states the post-op shoe on her RLE feels very heavy and difficult to lift, and at this time is more appropriate for pivoting. Patient able to use bedside commode with min A x1 and significant cuing for sequencing and hand placement, with minimal eccentric control on descent. Skilled acute PT services continue to be indicated to address her mobility deficits.   Follow Up Recommendations  SNF     Equipment Recommendations  Rolling walker with 5" wheels    Recommendations for Other Services       Precautions / Restrictions Precautions Precautions: Fall Restrictions Weight Bearing Restrictions: No LLE Weight Bearing: Weight bearing as tolerated (on RLE) Other Position/Activity Restrictions: Keep LLE knee in extension to prevent contractures.     Mobility  Bed Mobility Overal bed mobility: Needs Assistance Bed Mobility: Supine to Sit     Supine to sit: Modified independent (Device/Increase time);HOB elevated     General bed mobility comments: Patient uses bedrails appropriately and is able to complete supine to sit transfer with HOB elevated and use of bridging via her RLE with no additional assistance or cuing.   Transfers Overall transfer level: Needs  assistance Equipment used: Rolling walker (2 wheeled) Transfers: Sit to/from Stand Sit to Stand: Min assist         General transfer comment: Patient cued to have RLE brought under her knee joint and to lean anteriorly so as to provide pushing through her elbow extensors rather than pulling through the RW. Patient required cuing for sequencing of hand transfer from transfer surface to RW. No LE buckling noted throughout transfers/ambulation.   Ambulation/Gait Ambulation/Gait assistance: Min assist Ambulation Distance (Feet): 3 Feet Assistive device: Rolling walker (2 wheeled)     Gait velocity interpretation: Below normal speed for age/gender General Gait Details: Patient cued extensively to maintain elbows in extension and push through RW for hopping, at this time she does not have the UE strength to complete more than a few hops. Patient instead instructed to pivot on RLE, cued for turning RW first. Patient fatigued quickly with this and states with her post-op shoe on RLE that it "feels like it's stuck to the floor".    Stairs            Wheelchair Mobility    Modified Rankin (Stroke Patients Only)       Balance Overall balance assessment: Needs assistance   Sitting balance-Leahy Scale: Good Sitting balance - Comments: No loss of balance noted in unsupported sitting at bedside.    Standing balance support: Bilateral upper extremity supported Standing balance-Leahy Scale: Fair Standing balance comment: Patient displays no obvious loss of balance in standing, however with ambulation/pivoting she does not possess the requisite UE strength to independently complete more than 3-5 pivots/hops without fatiguing.  Cognition Arousal/Alertness: Awake/alert Behavior During Therapy: WFL for tasks assessed/performed Overall Cognitive Status: Within Functional Limits for tasks assessed                      Exercises General Exercises - Lower  Extremity Ankle Circles/Pumps: AROM;Right;15 reps Long Arc Quad: AROM;15 reps;Both;Seated Heel Slides: AROM;15 reps;Right Hip ABduction/ADduction: AROM;15 reps;Left Straight Leg Raises: AROM;15 reps;Both    General Comments General comments (skin integrity, edema, etc.): Initially LLE had pillow underneath the knee joint, instructed patient and RN staff pillow needs to be under residual limb to maintain knee joint in extension. Bandage over RLE foot.       Pertinent Vitals/Pain Pain Assessment: 0-10 Pain Score: 2  Pain Location: Phantom pain described as burning over her left toes.  Pain Descriptors / Indicators: Burning Pain Intervention(s): Limited activity within patient's tolerance;Monitored during session    Home Living                      Prior Function            PT Goals (current goals can now be found in the care plan section) Acute Rehab PT Goals Patient Stated Goal: To go home PT Goal Formulation: With patient Time For Goal Achievement: 09/01/15 Potential to Achieve Goals: Good Progress towards PT goals: Progressing toward goals    Frequency  7X/week    PT Plan Current plan remains appropriate    Co-evaluation             End of Session Equipment Utilized During Treatment: Gait belt Activity Tolerance: Patient tolerated treatment well Patient left: in chair;with call bell/phone within reach;with chair alarm set     Time: 2423-5361 PT Time Calculation (min) (ACUTE ONLY): 33 min  Charges:  $Therapeutic Exercise: 8-22 mins $Therapeutic Activity: 8-22 mins                    G Codes:     Kerman Passey, PT, DPT    08/19/2015, 10:36 AM

## 2015-08-19 NOTE — Progress Notes (Signed)
SNF bed selected at [facility]- tentatively for tomorrow- patient in good spirits today- a bit frustrated by the "phantom pains". Support and encouragement provided.    Eduard Clos, MSW, [place]

## 2015-08-19 NOTE — Anesthesia Postprocedure Evaluation (Signed)
  Anesthesia Post-op Note  Patient: [name]  Procedure(s) Performed: Procedure(s): AMPUTATION BELOW KNEE (Left)  Anesthesia type:General  Patient location: PACU  Post pain: Pain level controlled  Post assessment: Post-op Vital signs reviewed, Patient's Cardiovascular Status Stable, Respiratory Function Stable, Patent Airway and No signs of Nausea or vomiting  Post vital signs: Reviewed and stable  Last Vitals:  Filed Vitals:   08/19/15 0546  BP: 129/49  Pulse:   Temp:   Resp:     Level of consciousness: awake, alert  and patient cooperative  Complications: No apparent anesthesia complications

## 2015-08-19 NOTE — Progress Notes (Signed)
Wet to dry dressing change done to pts. Right foot.

## 2015-08-19 NOTE — Progress Notes (Signed)
Pt. Alert and oriented. VSS. Pts. Having phantom pain in left lower extremity controlled with PO pain meds. Using bedpan. Tolerating PO's. Receiving IV antibiotics. Resting quietly.

## 2015-08-19 NOTE — Clinical Social Work Placement (Signed)
   CLINICAL SOCIAL WORK PLACEMENT  NOTE  Date:  08/19/2015  Patient Details  Name: [name] MRN: [id number] Date of Birth: 1951/10/28  Clinical Social Work is seeking post-discharge placement for this patient at the [place] level of care (*CSW will initial, date and re-position this form in  chart as items are completed):  Yes   Patient/family provided with [place] Work Department's list of facilities offering this level of care within the geographic area requested by the patient (or if unable, by the patient's family).  Yes   Patient/family informed of their freedom to choose among providers that offer the needed level of care, that participate in Medicare, Medicaid or managed care program needed by the patient, have an available bed and are willing to accept the patient.  Yes   Patient/family informed of [place]'s ownership interest in [facility] and [facility], as well as of the fact that they are under no obligation to receive care at these facilities.  PASRR submitted to EDS on 08/18/15     PASRR number received on 08/18/15     Existing PASRR number confirmed on       FL2 transmitted to all facilities in geographic area requested by pt/family on 08/18/15     FL2 transmitted to all facilities within larger geographic area on       Patient informed that his/her managed care company has contracts with or will negotiate with certain facilities, including the following:        Yes   Patient/family informed of bed offers received.  Patient chooses bed at  [facility])     Physician recommends and patient chooses bed at      Patient to be transferred to   on  .  Patient to be transferred to facility by       Patient family notified on   of transfer.  Name of family member notified:        PHYSICIAN Please sign FL2     Additional Comment:    _______________________________________________ Ludwig Clarks, LCSW 08/19/2015, 3:10 PM

## 2015-08-19 NOTE — Clinical Social Work Note (Addendum)
Clinical Social Work Assessment  Patient Details  Name: [name] MRN: [id number] Date of Birth: 1951-04-21  Date of referral:  08/17/15               Reason for consult:  Facility Placement                Permission sought to share information with:  Facility Art therapist granted to share information::  Yes, Verbal Permission Granted  Name::      (daughter and son)  Agency::     Relationship::   (adult children)  Contact Information:     Housing/Transportation Living arrangements for the past 2 months:  Single Family Home Source of Information:  Patient Patient Interpreter Needed:  None Criminal Activity/Legal Involvement Pertinent to Current Situation/Hospitalization:  No - Comment as needed Significant Relationships:  Adult Children, Community Support Lives with:  Self Do you feel safe going back to the place where you live?  No Need for family participation in patient care:  No (Coment)  Care giving concerns:  Patient will need SNF rehab at dc- s/p BKA    Social Worker assessment / plan:  CSW met with patient pre-operatively to begin SNF conversation. Employment status:  Retired Nurse, adult PT Recommendations:  [place], [place] / Referral to community resources:  [place]  Patient/Family's Response to care:  Patient understands plan for SNF at [facility]- wants to consider [employer]  Patient/Family's Understanding of and Emotional Response to Diagnosis, Current Treatment, and Prognosis: accepting   Emotional Assessment Appearance:  Appears stated age Attitude/Demeanor/Rapport:   (NA) Affect (typically observed):  Appropriate, Accepting Orientation:  Oriented to Situation, Oriented to Self, Oriented to Place, Oriented to  Time Alcohol / Substance use:  Not Applicable Psych involvement (Current and /or in the community):  No (Comment)  Discharge Needs  Concerns to be addressed:   Discharge Planning Concerns, Adjustment to Illness Readmission within the last 30 days:  Yes Current discharge risk:  Physical Impairment Barriers to Discharge:      Ludwig Clarks, LCSW 08/19/2015, 3:05 PM

## 2015-08-20 ENCOUNTER — Encounter: Admission: RE | Admit: 2015-08-20 | Payer: Medicare Other | Source: Ambulatory Visit | Admitting: Internal Medicine

## 2015-08-20 LAB — GLUCOSE, CAPILLARY
GLUCOSE-CAPILLARY: 163 mg/dL — AB (ref 65–99)
Glucose-Capillary: 102 mg/dL — ABNORMAL HIGH (ref 65–99)

## 2015-08-20 MED ORDER — OXYCODONE HCL 5 MG PO TABS: 5.0000 mg | ORAL_TABLET | ORAL | Status: DC | PRN

## 2015-08-20 MED ORDER — RISAQUAD PO CAPS: 1.0000 | ORAL_CAPSULE | Freq: Every day | ORAL | Status: DC

## 2015-08-20 MED ORDER — HYDRALAZINE HCL 50 MG PO TABS
50.0000 mg | ORAL_TABLET | Freq: Three times a day (TID) | ORAL | Status: DC
  Administered 2015-08-20 (×2): 50 mg via ORAL
  Filled 2015-08-20 (×2): qty 1

## 2015-08-20 NOTE — Discharge Summary (Signed)
[name] at [facility] NAME: [name]    MR#:  [id number]  DATE OF BIRTH:  Feb 27, 1951  DATE OF ADMISSION:  08/13/2015 ADMITTING PHYSICIAN: Lytle Butte, MD  DATE OF DISCHARGE: No discharge date for patient encounter.  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 Sleep apnea     cpap  . Dysrhythmia     a fib  . Hypertension   . Neuropathy   . History of hiatal hernia   . Anemia   . Arthritis   . Diabetes mellitus without complication   . HOH (hard of hearing)     aids  . A-fib      ADMITTING HISTORY  [name] is a 64 y.o. female with a known history of peripheral vascular disease with ischemic left foot, type 2 diabetes insulin requiring presenting with fever 10 26F at home with worsening of left foot pain. Of note patient just discharged around 4:30 PM 08/13/2015 discharge diagnosis cellulitis left foot has a PICC line in place and was discharged on IV Zosyn. Despite these measures at home had worsening pain described only as "pain" nonradiating intensity 8/10 no worsening or relieving factors.   HOSPITAL COURSE:   64 year old female with peripheral arterial disease status post PCI for left lower extremity who presented with left foot cellulitis with gangrene and sepsis.  1. Sepsis: Patient had group B strep in blood culture during recent admission. Sepsis is likely from her gangrenous foot. She was on Zosyn and vancomycin . ID has been consulted. Blood cultures are negative to date. Finished course for bacteremia,and now after amputation stopped IV antibiotics and switched to Keflex orally. No further abx needed.  2. Left foot gangrene: Patient is status post left lower extremity angioplasty  and during last hospitalization she did not want amputation. She presents with fever s/p BKA done.08/17/15.  3. Peripheral arterial disease: Status post PCI. Continue aspirin and statin  4. Hyponatremia: Resolving likely secondary to dehydration as sodium level improved with IV fluids.  5. Type 2 diabetes: Continue sliding scale insulin, ADA diet , Amaryl and Levemir.  6. Chronic atrial fibrillation: Patient's heart rate is controlled on sotalol.  7. Anemia of chronic disease: Patient's hemoglobin is stable  8. Diarrhea: negative C. difficile and added probiotic to her medications. Improved.  Stable for discharge to SNF. F/u with Dr. Lucky Cowboy   CONSULTS OBTAINED:  Treatment Team:  Adrian Prows, MD Algernon Huxley, MD Katha Cabal, MD  DRUG ALLERGIES:   Allergies  Allergen Reactions  . Codeine Nausea And Vomiting  . Tape Rash    DISCHARGE MEDICATIONS:   Current Discharge Medication List    START taking these medications   Details  acidophilus (RISAQUAD) CAPS capsule Take 1 capsule by mouth daily. Qty: 30 capsule, Refills: 0    oxyCODONE (OXY IR/ROXICODONE) 5 MG immediate release tablet Take 1 tablet (5 mg total) by mouth every 4 (four) hours as needed for moderate pain or severe pain. Qty: 30 tablet, Refills: 0      CONTINUE these medications which have NOT CHANGED   Details  acetaminophen (TYLENOL) 500 MG tablet Take 1,000 mg by mouth every 6 (six) hours as needed for mild pain or headache.  aspirin EC 81 MG tablet Take 81 mg by mouth 2 (two) times daily.    diphenhydrAMINE (BENADRYL) 25 MG tablet Take 25 mg by mouth at bedtime as needed for sleep.    gabapentin (NEURONTIN) 100 MG capsule Take 100 mg by mouth 3 (three) times daily.     glimepiride (AMARYL) 2 MG tablet Take 2 mg by mouth daily with breakfast.    ibuprofen (ADVIL,MOTRIN) 200 MG tablet Take 400 mg by mouth every 6 (six) hours as needed for headache or mild pain.    insulin NPH Human  (HUMULIN N) 100 UNIT/ML injection Inject 0.06 mLs (6 Units total) into the skin 2 (two) times daily before a meal. Qty: 10 mL, Refills: 11    Omega-3 Fatty Acids (FISH OIL) 1200 MG CAPS Take 1,200 mg by mouth 2 (two) times daily.    pyridOXINE (VITAMIN B-6) 100 MG tablet Take 100 mg by mouth at bedtime.     sotalol (BETAPACE) 80 MG tablet Take 80 mg by mouth 2 (two) times daily.     vitamin E 400 UNIT capsule Take 400 Units by mouth at bedtime.     atorvastatin (LIPITOR) 40 MG tablet Take 40 mg by mouth at bedtime.      STOP taking these medications     piperacillin-tazobactam (ZOSYN) 3.375 GM/50ML IVPB          Today    VITAL SIGNS:  Blood pressure 178/75, pulse 85, temperature 98.2 F (36.8 C), temperature source Oral, resp. rate 18, height 5\' 2"  (1.575 m), weight 72.757 kg (160 lb 6.4 oz), SpO2 100 %.  I/O:   Intake/Output Summary (Last 24 hours) at 08/20/15 1056 Last data filed at 08/20/15 0900  Gross per 24 hour  Intake    720 ml  Output    500 ml  Net    220 ml    PHYSICAL EXAMINATION:  Physical Exam  GENERAL:  64 y.o.-year-old patient lying in the bed with no acute distress.  LUNGS: Normal breath sounds bilaterally, no wheezing, rales,rhonchi or crepitation. No use of accessory muscles of respiration.  CARDIOVASCULAR: S1, S2 normal. No murmurs, rubs, or gallops.  ABDOMEN: Soft, non-tender, non-distended. Bowel sounds present. No organomegaly or mass.  NEUROLOGIC: Moves all 4 extremities. PSYCHIATRIC: The patient is alert and oriented x 3.  SKIN: Amputated stump looks clean. Left below knee amputation  DATA REVIEW:   CBC  Recent Labs Lab 08/19/15 1030  WBC 15.5*  HGB 8.0*  HCT 25.1*  PLT 427    Chemistries   Recent Labs Lab 08/14/15 0004  08/18/15 0400  NA 130*  < > 135  K 3.8  < > 3.4*  CL 97*  < > 105  CO2 24  < > 23  GLUCOSE 178*  < > 121*  BUN 16  < > 11  CREATININE 1.19*  < > 0.98  CALCIUM 8.1*  < > 7.6*  AST 29  --   --   ALT  34  --   --   ALKPHOS 78  --   --   BILITOT 0.4  --   --   < > = values in this interval not displayed.  Cardiac Enzymes No results for input(s): TROPONINI in the last 168 hours.  Microbiology Results  Results for orders placed or performed during the hospital encounter of 08/13/15  Culture, blood (routine x 2)     Status: None   Collection Time: 08/14/15 12:04 AM  Result Value Ref Range  Status   Specimen Description BLOOD RIGHT ARM  Final   Special Requests BOTTLES DRAWN AEROBIC AND ANAEROBIC 5ML  Final   Culture NO GROWTH 5 DAYS  Final   Report Status 08/19/2015 FINAL  Final  Culture, blood (routine x 2)     Status: None   Collection Time: 08/14/15 12:49 AM  Result Value Ref Range Status   Specimen Description BLOOD RIGHT ANTECUBITAL  Final   Special Requests BOTTLES DRAWN AEROBIC AND ANAEROBIC 5ML  Final   Culture NO GROWTH 5 DAYS  Final   Report Status 08/19/2015 FINAL  Final  C difficile quick scan w PCR reflex     Status: None   Collection Time: 08/15/15  3:37 PM  Result Value Ref Range Status   C Diff antigen NEGATIVE NEGATIVE Final   C Diff toxin NEGATIVE NEGATIVE Final   C Diff interpretation Negative for C. difficile  Final  MRSA PCR Screening     Status: None   Collection Time: 08/16/15  9:32 PM  Result Value Ref Range Status   MRSA by PCR NEGATIVE NEGATIVE Final    Comment:        The GeneXpert MRSA Assay (FDA approved for NASAL specimens only), is one component of a comprehensive MRSA colonization surveillance program. It is not intended to diagnose MRSA infection nor to guide or monitor treatment for MRSA infections.     RADIOLOGY:  No results found.    Follow up with PCP in 1 week.  Management plans discussed with the patient, family and they are in agreement.  CODE STATUS:     Code Status Orders        Start     Ordered   08/14/15 0046  Full code   Continuous     08/14/15 0045    Advance Directive Documentation        Most Recent  Value   Type of Advance Directive  Healthcare Power of Attorney   Pre-existing out of facility DNR order (yellow form or pink MOST form)     "MOST" Form in Place?        TOTAL TIME TAKING CARE OF THIS PATIENT ON DAY OF DISCHARGE: more than 35 minutes.    Hillary Bow R M.D on 08/20/2015 at 10:56 AM  Between 7am to 6pm - Pager - [phone]  After 6pm go to [email or web address] - password [email or web address] [place] [facility]  Office  [phone]  CC: Primary care physician; [name], DO

## 2015-08-20 NOTE — Progress Notes (Signed)
Dressing to right foot changed.

## 2015-08-20 NOTE — Clinical Social Work Placement (Signed)
   CLINICAL SOCIAL WORK PLACEMENT  NOTE  Date:  08/20/2015  Patient Details  Name: [name] MRN: [id number] Date of Birth: 06-13-1951  Clinical Social Work is seeking post-discharge placement for this patient at the [place] level of care (*CSW will initial, date and re-position this form in  chart as items are completed):  Yes   Patient/family provided with [place] Work Department's list of facilities offering this level of care within the geographic area requested by the patient (or if unable, by the patient's family).  Yes   Patient/family informed of their freedom to choose among providers that offer the needed level of care, that participate in Medicare, Medicaid or managed care program needed by the patient, have an available bed and are willing to accept the patient.  Yes   Patient/family informed of [facility]'s ownership interest in [facility] and [facility], as well as of the fact that they are under no obligation to receive care at these facilities.  PASRR submitted to EDS on 08/18/15     PASRR number received on 08/18/15     Existing PASRR number confirmed on       FL2 transmitted to all facilities in geographic area requested by pt/family on 08/18/15     FL2 transmitted to all facilities within larger geographic area on       Patient informed that his/her managed care company has contracts with or will negotiate with certain facilities, including the following:        Yes   Patient/family informed of bed offers received.  Patient chooses bed at  [facility])     Physician recommends and patient chooses bed at      Patient to be transferred to   on  .  Patient to be transferred to facility by   EMS    Patient family notified on  08/20/15 of transfer.  Name of family member notified:     Patient contacted son Bran[place]   PHYSICIAN Please sign FL2     Additional Comment:     _______________________________________________ Maurine Cane, LCSW 08/20/2015, 12:05 PM

## 2015-08-20 NOTE — Progress Notes (Signed)
CSW informed by MD that patient is medically.    Patient is in agreement to discharge to [facility] ,informed CSW she will call her son and inform him she will discharge to day.  Call to [facility], Faxed copy of Rx, completed discharge packet and updated FL2, orig Rx placed in patient's discharge packet.   Patient will go to room21b and RN should call report to  [phone]. Room will be ready after 2:00 pm.  Patient will be transported via EMS.  Casimer Lanius. Latanya Presser, MSW Clinical Social Work Department Emergency Room [phone] 12:09 PM

## 2015-08-20 NOTE — Care Management (Signed)
PICC to be discontinued per RN prior to discharge to SNF. [facility] will not take Rx back because it was delivered to patient's home (she returned to hospital. Spoke with patient's son and he is going to try to "give it away" to an indigent clinic. Plan for discharge to SNF today. [facility] aware that patient is going to SNF.

## 2015-08-20 NOTE — Progress Notes (Signed)
Report called to [facility] verbalized an understanding of repor/ iv and picc line removed per order/ EMS called to trasnport

## 2015-08-20 NOTE — Progress Notes (Signed)
[facility]  Daily Progress Note   Subjective: 3 Days Post-Op: left below the knee amputation for left foot gangrene  Patient doing better today. Less pain at stump site. Continues to work with PT.   Objective: Filed Vitals:   08/19/15 1951 08/20/15 0424 08/20/15 0425 08/20/15 0734  BP: 137/53 168/68 151/72 178/75  Pulse: 68 76 61 85  Temp:  98.8 F (37.1 C)  98.2 F (36.8 C)  TempSrc:  Oral  Oral  Resp:  20  18  Height:      Weight:      SpO2:  99%  100%    Intake/Output Summary (Last 24 hours) at 08/20/15 0850 Last data filed at 08/20/15 0442  Gross per 24 hour  Intake    480 ml  Output    500 ml  Net    -20 ml    Physical Exam: A&Ox3, NAD CV: RRR Pulmonary: CTA Bilaterally Abdomen: Soft, Nontender, Nondistended Vascular: Left Leg Extremity: OR dressing removed, stump healthy, staple line clean and dry. Thigh soft.   Laboratory: CBC    Component Value Date/Time   WBC 15.5* 08/19/2015 1030   HGB 8.0* 08/19/2015 1030   HCT 25.1* 08/19/2015 1030   PLT 427 08/19/2015 1030    BMET    Component Value Date/Time   NA 135 08/18/2015 0400   K 3.4* 08/18/2015 0400   CL 105 08/18/2015 0400   CO2 23 08/18/2015 0400   GLUCOSE 121* 08/18/2015 0400   BUN 11 08/18/2015 0400   CREATININE 0.98 08/18/2015 0400   CALCIUM 7.6* 08/18/2015 0400   GFRNONAA 59* 08/18/2015 0400   GFRAA >60 08/18/2015 0400    Assessment/Planning: 64 year old female s/p left BKA for left foot gangrene - POD#3 - stable 1) PT 2) Pain Control 3) OK to discharge to rehab  4) Care as per primary team  [facility] PA-C 08/20/2015 8:50 AM

## 2015-08-20 NOTE — Care Management Important Message (Signed)
Important Message  Patient Details  Name: [name] MRN: [id number] Date of Birth: May 19, 1951   Medicare Important Message Given:  Yes-third notification given    [name] 08/20/2015, 10:38 AM

## 2015-08-20 NOTE — Progress Notes (Signed)
Pt. Was assigned to me at 2300. POD 3 from left BKA. Alert and oriented. VSS. Dressing to BKA clean dry and intact. Dressing to right foot ulcer was changed. Pts. Required no pain medication during my shift. For possible d/c to [facility] today. Will continue to monitor.

## 2015-08-20 NOTE — Progress Notes (Signed)
PT Cancellation Note  Patient Details Name: [name] MRN: [id number] DOB: 18-Jul-1951   Cancelled Treatment:    Reason Eval/Treat Not Completed: Other (comment) (Leaving for SNF and is tired from getting up to [facility]).  Leaving imminently.   Ramond Dial 08/20/2015, 11:54 AM  Mee Hives, PT MS Acute Rehab Dept. Number: [facility] [id number] and [place] [phone]

## 2015-08-20 NOTE — Progress Notes (Signed)
Discharge via stretcher with EMT staff. Valuables sent with patient.

## 2015-08-20 NOTE — Progress Notes (Signed)
Pt. Called out for pain medication. After medication was pulled and opened, she decided she didn't want it. Had to be wasted.

## 2015-08-20 NOTE — Discharge Instructions (Signed)
°  DIET:  Diabetic diet  DISCHARGE CONDITION:  Stable  ACTIVITY:  Activity as tolerated per Dr. Lucky Cowboy  OXYGEN:  Home Oxygen: No.   Oxygen Delivery: room air  DISCHARGE LOCATION:  nursing home   If you experience worsening of your admission symptoms, develop shortness of breath, life threatening emergency, suicidal or homicidal thoughts you must seek medical attention immediately by calling 911 or calling your MD immediately  if symptoms less severe.  You Must read complete instructions/literature along with all the possible adverse reactions/side effects for all the Medicines you take and that have been prescribed to you. Take any new Medicines after you have completely understood and accpet all the possible adverse reactions/side effects.   Please note  You were cared for by a hospitalist during your hospital stay. If you have any questions about your discharge medications or the care you received while you were in the hospital after you are discharged, you can call the unit and asked to speak with the hospitalist on call if the hospitalist that took care of you is not available. Once you are discharged, your primary care physician will handle any further medical issues. Please note that NO REFILLS for any discharge medications will be authorized once you are discharged, as it is imperative that you return to your primary care physician (or establish a relationship with a primary care physician if you do not have one) for your aftercare needs so that they can reassess your need for medications and monitor your lab values.

## 2015-08-21 ENCOUNTER — Telehealth: Payer: Self-pay | Admitting: *Deleted

## 2015-08-21 ENCOUNTER — Telehealth: Payer: Self-pay

## 2015-08-21 NOTE — Telephone Encounter (Signed)
Attempted to complete TCM, left VM to return call.

## 2015-08-21 NOTE — Telephone Encounter (Signed)
Patient D/C from hospital ob 09/15 . Patient has a upcoming appt with Dr. Lacinda Axon 09/19.

## 2015-08-21 NOTE — Telephone Encounter (Signed)
Attempted to call patient, left message on her cell phone to call the office.  [place]  Transition Care Management Follow-up Telephone Call  How have you been since you were released from the hospital?    Do you understand why you were in the hospital?    Do you understand the discharge instrcutions?   Items Reviewed:  Medications reviewed:   Allergies reviewed:   Dietary changes reviewed:   Referrals reviewed:    Functional Questionnaire:   Activities of Daily Living (ADLs):   She states they are independent in the following:  States they require assistance with the following:    Any transportation issues/concerns?:    Any patient concerns?    Confirmed importance and date/time of follow-up visits scheduled:    Confirmed with patient if condition begins to worsen call PCP or go to the ER.  Patient was given the Call-a-Nurse line [phone]:

## 2015-08-24 ENCOUNTER — Ambulatory Visit: Payer: Medicare Other | Admitting: Family Medicine

## 2015-09-05 ENCOUNTER — Encounter
Admission: RE | Admit: 2015-09-05 | Discharge: 2015-09-05 | Disposition: A | Discharge status: Home / Self Care | Payer: Medicare Other | Source: Ambulatory Visit | Attending: Internal Medicine | Admitting: Internal Medicine

## 2015-09-05 DIAGNOSIS — E119 Type 2 diabetes mellitus without complications: Secondary | ICD-10-CM | POA: Insufficient documentation

## 2015-09-05 DIAGNOSIS — Z794 Long term (current) use of insulin: Secondary | ICD-10-CM | POA: Diagnosis not present

## 2015-09-05 LAB — GLUCOSE, CAPILLARY: GLUCOSE-CAPILLARY: 98 mg/dL (ref 65–99)

## 2015-09-07 DIAGNOSIS — E119 Type 2 diabetes mellitus without complications: Secondary | ICD-10-CM | POA: Diagnosis not present

## 2015-09-07 LAB — GLUCOSE, CAPILLARY
GLUCOSE-CAPILLARY: 118 mg/dL — AB (ref 65–99)
GLUCOSE-CAPILLARY: 93 mg/dL (ref 65–99)
Glucose-Capillary: 103 mg/dL — ABNORMAL HIGH (ref 65–99)
Glucose-Capillary: 125 mg/dL — ABNORMAL HIGH (ref 65–99)
Glucose-Capillary: 65 mg/dL (ref 65–99)
Glucose-Capillary: 115 mg/dL — ABNORMAL HIGH (ref 65–99)

## 2015-09-08 DIAGNOSIS — E119 Type 2 diabetes mellitus without complications: Secondary | ICD-10-CM | POA: Diagnosis not present

## 2015-09-08 LAB — GLUCOSE, CAPILLARY: Glucose-Capillary: 139 mg/dL — ABNORMAL HIGH (ref 65–99)

## 2015-09-09 LAB — GLUCOSE, CAPILLARY
GLUCOSE-CAPILLARY: 171 mg/dL — AB (ref 65–99)
GLUCOSE-CAPILLARY: 159 mg/dL — AB (ref 65–99)
GLUCOSE-CAPILLARY: 98 mg/dL (ref 65–99)
Glucose-Capillary: 101 mg/dL — ABNORMAL HIGH (ref 65–99)
Glucose-Capillary: 73 mg/dL (ref 65–99)
Glucose-Capillary: 156 mg/dL — ABNORMAL HIGH (ref 65–99)
Glucose-Capillary: 144 mg/dL — ABNORMAL HIGH (ref 65–99)

## 2015-09-10 DIAGNOSIS — E119 Type 2 diabetes mellitus without complications: Secondary | ICD-10-CM | POA: Diagnosis not present

## 2015-09-10 LAB — GLUCOSE, CAPILLARY
GLUCOSE-CAPILLARY: 150 mg/dL — AB (ref 65–99)
Glucose-Capillary: 157 mg/dL — ABNORMAL HIGH (ref 65–99)
Glucose-Capillary: 114 mg/dL — ABNORMAL HIGH (ref 65–99)
Glucose-Capillary: 69 mg/dL (ref 65–99)

## 2015-09-11 DIAGNOSIS — E119 Type 2 diabetes mellitus without complications: Secondary | ICD-10-CM | POA: Diagnosis not present

## 2015-09-11 LAB — GLUCOSE, CAPILLARY: GLUCOSE-CAPILLARY: 216 mg/dL — AB (ref 65–99)

## 2015-09-12 DIAGNOSIS — E119 Type 2 diabetes mellitus without complications: Secondary | ICD-10-CM | POA: Diagnosis not present

## 2015-09-12 LAB — GLUCOSE, CAPILLARY
GLUCOSE-CAPILLARY: 132 mg/dL — AB (ref 65–99)
GLUCOSE-CAPILLARY: 167 mg/dL — AB (ref 65–99)
GLUCOSE-CAPILLARY: 140 mg/dL — AB (ref 65–99)
Glucose-Capillary: 133 mg/dL — ABNORMAL HIGH (ref 65–99)
Glucose-Capillary: 72 mg/dL (ref 65–99)
Glucose-Capillary: 167 mg/dL — ABNORMAL HIGH (ref 65–99)
Glucose-Capillary: 179 mg/dL — ABNORMAL HIGH (ref 65–99)

## 2015-09-13 LAB — GLUCOSE, CAPILLARY
GLUCOSE-CAPILLARY: 161 mg/dL — AB (ref 65–99)
Glucose-Capillary: 116 mg/dL — ABNORMAL HIGH (ref 65–99)
Glucose-Capillary: 112 mg/dL — ABNORMAL HIGH (ref 65–99)

## 2015-09-14 DIAGNOSIS — E119 Type 2 diabetes mellitus without complications: Secondary | ICD-10-CM | POA: Diagnosis not present

## 2015-09-14 LAB — GLUCOSE, CAPILLARY
GLUCOSE-CAPILLARY: 201 mg/dL — AB (ref 65–99)
GLUCOSE-CAPILLARY: 90 mg/dL (ref 65–99)

## 2015-09-15 LAB — GLUCOSE, CAPILLARY
GLUCOSE-CAPILLARY: 106 mg/dL — AB (ref 65–99)
GLUCOSE-CAPILLARY: 141 mg/dL — AB (ref 65–99)
GLUCOSE-CAPILLARY: 109 mg/dL — AB (ref 65–99)
Glucose-Capillary: 68 mg/dL (ref 65–99)
Glucose-Capillary: 116 mg/dL — ABNORMAL HIGH (ref 65–99)
Glucose-Capillary: 147 mg/dL — ABNORMAL HIGH (ref 65–99)

## 2015-09-16 DIAGNOSIS — E119 Type 2 diabetes mellitus without complications: Secondary | ICD-10-CM | POA: Diagnosis not present

## 2015-09-16 LAB — GLUCOSE, CAPILLARY
Glucose-Capillary: 117 mg/dL — ABNORMAL HIGH (ref 65–99)
Glucose-Capillary: 145 mg/dL — ABNORMAL HIGH (ref 65–99)

## 2015-09-17 DIAGNOSIS — E119 Type 2 diabetes mellitus without complications: Secondary | ICD-10-CM | POA: Diagnosis not present

## 2015-09-17 LAB — GLUCOSE, CAPILLARY
GLUCOSE-CAPILLARY: 188 mg/dL — AB (ref 65–99)
GLUCOSE-CAPILLARY: 156 mg/dL — AB (ref 65–99)
GLUCOSE-CAPILLARY: 151 mg/dL — AB (ref 65–99)
Glucose-Capillary: 118 mg/dL — ABNORMAL HIGH (ref 65–99)
Glucose-Capillary: 99 mg/dL (ref 65–99)

## 2015-09-18 DIAGNOSIS — E119 Type 2 diabetes mellitus without complications: Secondary | ICD-10-CM | POA: Diagnosis not present

## 2015-09-18 LAB — GLUCOSE, CAPILLARY: Glucose-Capillary: 142 mg/dL — ABNORMAL HIGH (ref 65–99)

## 2015-09-19 LAB — GLUCOSE, CAPILLARY: GLUCOSE-CAPILLARY: 104 mg/dL — AB (ref 65–99)

## 2015-09-24 ENCOUNTER — Telehealth: Payer: Self-pay | Admitting: Family Medicine

## 2015-09-24 ENCOUNTER — Other Ambulatory Visit: Payer: Self-pay

## 2015-09-24 MED ORDER — GLUCOSE BLOOD VI STRP: ORAL_STRIP | Status: DC

## 2015-09-24 NOTE — Telephone Encounter (Signed)
Pt is wanting to know if a prescription can be written for her contour strips made by [employer] 25 or 50 in box. Pharmacy [facility] on [address] [phone]. Thank You!

## 2015-09-24 NOTE — Telephone Encounter (Signed)
Refilled per her request.

## 2015-10-02 ENCOUNTER — Other Ambulatory Visit: Payer: Self-pay

## 2015-10-02 ENCOUNTER — Telehealth: Payer: Self-pay | Admitting: *Deleted

## 2015-10-02 MED ORDER — GLUCOSE BLOOD VI STRP: ORAL_STRIP | Status: DC

## 2015-10-02 NOTE — Telephone Encounter (Signed)
I have renewed her prescription.

## 2015-10-02 NOTE — Telephone Encounter (Signed)
Patient has requested an ongoing Rx for her  diabetic test strips.

## 2015-10-27 ENCOUNTER — Ambulatory Visit: Payer: Medicare Other | Admitting: Family Medicine

## 2015-11-02 ENCOUNTER — Other Ambulatory Visit: Payer: Self-pay | Admitting: Family Medicine

## 2015-11-03 ENCOUNTER — Other Ambulatory Visit: Payer: Self-pay | Admitting: Cardiovascular Disease

## 2015-11-03 NOTE — Telephone Encounter (Signed)
Please advise 

## 2015-11-05 ENCOUNTER — Encounter: Payer: Self-pay | Admitting: *Deleted

## 2015-11-05 ENCOUNTER — Other Ambulatory Visit: Payer: Self-pay | Admitting: *Deleted

## 2015-11-05 DIAGNOSIS — I10 Essential (primary) hypertension: Secondary | ICD-10-CM | POA: Insufficient documentation

## 2015-11-13 ENCOUNTER — Encounter: Payer: Self-pay | Admitting: Family Medicine

## 2015-11-13 ENCOUNTER — Ambulatory Visit (INDEPENDENT_AMBULATORY_CARE_PROVIDER_SITE_OTHER): Payer: Medicare Other | Admitting: Family Medicine

## 2015-11-13 VITALS — BP 124/72 | HR 102 | Temp 98.9°F | Ht 62.75 in | Wt 139.0 lb

## 2015-11-13 DIAGNOSIS — Z23 Encounter for immunization: Secondary | ICD-10-CM | POA: Diagnosis not present

## 2015-11-13 DIAGNOSIS — Z0000 Encounter for general adult medical examination without abnormal findings: Secondary | ICD-10-CM | POA: Diagnosis not present

## 2015-11-13 DIAGNOSIS — E785 Hyperlipidemia, unspecified: Secondary | ICD-10-CM | POA: Diagnosis not present

## 2015-11-13 DIAGNOSIS — Z1211 Encounter for screening for malignant neoplasm of colon: Secondary | ICD-10-CM | POA: Diagnosis not present

## 2015-11-13 DIAGNOSIS — E2839 Other primary ovarian failure: Secondary | ICD-10-CM

## 2015-11-13 DIAGNOSIS — IMO0002 Reserved for concepts with insufficient information to code with codable children: Secondary | ICD-10-CM

## 2015-11-13 DIAGNOSIS — I739 Peripheral vascular disease, unspecified: Secondary | ICD-10-CM

## 2015-11-13 DIAGNOSIS — E1140 Type 2 diabetes mellitus with diabetic neuropathy, unspecified: Secondary | ICD-10-CM | POA: Diagnosis not present

## 2015-11-13 DIAGNOSIS — Z89519 Acquired absence of unspecified leg below knee: Secondary | ICD-10-CM | POA: Insufficient documentation

## 2015-11-13 DIAGNOSIS — D649 Anemia, unspecified: Secondary | ICD-10-CM

## 2015-11-13 DIAGNOSIS — I4891 Unspecified atrial fibrillation: Secondary | ICD-10-CM

## 2015-11-13 DIAGNOSIS — E1165 Type 2 diabetes mellitus with hyperglycemia: Secondary | ICD-10-CM | POA: Diagnosis not present

## 2015-11-13 DIAGNOSIS — I10 Essential (primary) hypertension: Secondary | ICD-10-CM

## 2015-11-13 LAB — COMPREHENSIVE METABOLIC PANEL
ALBUMIN: 4 g/dL (ref 3.5–5.2)
ALK PHOS: 46 U/L (ref 39–117)
ALT: 47 U/L — AB (ref 0–35)
AST: 42 U/L — AB (ref 0–37)
BILIRUBIN TOTAL: 0.4 mg/dL (ref 0.2–1.2)
BUN: 17 mg/dL (ref 6–23)
CHLORIDE: 102 meq/L (ref 96–112)
CO2: 30 mEq/L (ref 19–32)
CREATININE: 0.7 mg/dL (ref 0.40–1.20)
Calcium: 10.1 mg/dL (ref 8.4–10.5)
GFR: 88.91 mL/min (ref >60.00)
Glucose, Bld: 127 mg/dL — ABNORMAL HIGH (ref 70–99)
Potassium: 5.1 mEq/L (ref 3.5–5.1)
SODIUM: 137 meq/L (ref 135–145)
TOTAL PROTEIN: 7.1 g/dL (ref 6.0–8.3)

## 2015-11-13 LAB — CBC
HCT: 36.8 % (ref 36.0–46.0)
Hemoglobin: 11.7 g/dL — ABNORMAL LOW (ref 12.0–15.0)
MCHC: 31.9 g/dL (ref 30.0–36.0)
MCV: 76.1 fl — ABNORMAL LOW (ref 78.0–100.0)
Platelets: 235 10*3/uL (ref 150.0–400.0)
RBC: 4.84 Mil/uL (ref 3.87–5.11)
RDW: 17.4 % — ABNORMAL HIGH (ref 11.5–15.5)
WBC: 15.8 10*3/uL — AB (ref 4.0–10.5)

## 2015-11-13 LAB — LIPID PANEL
CHOLESTEROL: 106 mg/dL (ref 0–200)
HDL: 29 mg/dL — ABNORMAL LOW (ref >39.00)
LDL CALC: 56 mg/dL (ref 0–99)
NonHDL: 77.34
Total CHOL/HDL Ratio: 4
Triglycerides: 107 mg/dL (ref 0.0–149.0)
VLDL: 21.4 mg/dL (ref 0.0–40.0)

## 2015-11-13 LAB — MICROALBUMIN / CREATININE URINE RATIO
Creatinine,U: 79.7 mg/dL
MICROALB UR: 10.7 mg/dL — AB (ref 0.0–1.9)
Microalb Creat Ratio: 13.4 mg/g (ref 0.0–30.0)

## 2015-11-13 LAB — HEMOGLOBIN A1C: HEMOGLOBIN A1C: 6.4 % (ref 4.6–6.5)

## 2015-11-13 MED ORDER — TETANUS-DIPHTH-ACELL PERTUSSIS 5-2.5-18.5 LF-MCG/0.5 IM SUSP: 0.5000 mL | Freq: Once | INTRAMUSCULAR | Status: DC

## 2015-11-13 MED ORDER — SOTALOL HCL 80 MG PO TABS: 80.0000 mg | ORAL_TABLET | Freq: Two times a day (BID) | ORAL | Status: DC

## 2015-11-13 MED ORDER — LISINOPRIL 2.5 MG PO TABS: 2.5000 mg | ORAL_TABLET | Freq: Every day | ORAL | Status: DC

## 2015-11-13 MED ORDER — ZOSTER VACCINE LIVE 19400 UNT/0.65ML ~~LOC~~ SOLR: 0.6500 mL | Freq: Once | SUBCUTANEOUS | Status: DC

## 2015-11-13 NOTE — Assessment & Plan Note (Signed)
Control appears to be good per CBG record. Will continue Novolin. Based on A1C today will consider stopping Amaryl.

## 2015-11-13 NOTE — Assessment & Plan Note (Addendum)
Prevnar given today. Rx for Tdap and Zostavax given today. Order for Bone density placed. Referral to GI for colonoscopy placed. Has seen Ophthalmology. Will obtain records.

## 2015-11-13 NOTE — Assessment & Plan Note (Signed)
BP well controlled. Given diabetes and other comorbidities, starting low dose Lisinopril today.

## 2015-11-13 NOTE — Progress Notes (Signed)
Pre visit review using our clinic review tool, if applicable. No additional management support is needed unless otherwise documented below in the visit note. 

## 2015-11-13 NOTE — Assessment & Plan Note (Signed)
Has follow up with Dr. Lucky Cowboy (Vascular) today. She will need further intervention regarding her Right leg. Left leg doing well. Compliant with ASA and Statin.

## 2015-11-13 NOTE — Progress Notes (Signed)
Subjective:  Patient ID: [name], female    DOB: 1951-03-08  Age: 64 y.o. MRN: [id number]  CC: Follow up DM-63  HPI:  64 year old female with a PMH of DM-2 w/ complications (neuropathy, PVD), Atrial fibrillation, HTN, HLD presents for follow up.  1) DM-2  Control improving. Last A1C was 11.9 on 8/29. Since then she has been started on insulin (started during hospitalization for sepsis, ischemic foot).  CBG's - Fasting: Range from 75-155 (most of them are in the low 100's).  Medications - Currently taking Amaryl 2 mg daily and Novolin 6 units BID.  Compliance - Yes.   Medication side effects  Hypoglycemia: Has had 1 episode of hypoglycemia with symptoms.  2) HTN  BP currently well controlled.  She is on Sotalol for rate control. No other meds.  3) HLD  Doing well on Lipitor.  In need of labs today.  4) Atrial fibrillation  Patient has long-standing history of A. Fib.  She has had cardioversion in the past.  She is doing well with Sotalol for rate control.  I'm unsure about the reasons for not anticoagulating as I do not have her records from cardiology.  5) PVD  Patient admitted earlier in the fall after developing cellulitis and subsequent ischemic left foot.  Now s/p BKA (left).  Has follow up with Dr. Lucky Cowboy today to discuss intervention regarding the right leg.  Compliant with ASA and Statin.  Social Hx   Social History   Social History  . Marital Status: Divorced    Spouse Name: N/A  . Number of Children: N/A  . Years of Education: N/A   Social History Main Topics  . Smoking status: Former Smoker -- 1.00 packs/day for 40 years    Types: Cigarettes    Quit date: 07/21/2009  . Smokeless tobacco: None  . Alcohol Use: 0.6 oz/week    0-1 Standard drinks or equivalent, 1 Glasses of wine per week     Comment: occas  . Drug Use: No  . Sexual Activity: Not Currently   Other Topics Concern  . None   Social History Narrative   ** Merged  History Encounter **       Review of Systems  Constitutional: Negative.   Respiratory: Negative for shortness of breath.   Cardiovascular: Negative for chest pain.   Objective:  BP 124/72 mmHg  Pulse 102  Temp(Src) 98.9 F (37.2 C) (Oral)  Ht 5' 2.75" (1.594 m)  Wt 139 lb (63.05 kg)  BMI 24.81 kg/m2  SpO2 93%  BP/Weight 11/13/2015 08/20/2015 4/85/4627  Systolic BP 035 009 -  Diastolic BP 72 53 -  Wt. (Lbs) 139 - 160.4  BMI 24.81 - -    Physical Exam  Constitutional: She is oriented to person, place, and time. She appears well-developed. No distress.  Cardiovascular: Normal rate and regular rhythm.   No murmur heard. Could not appreciated DP or PT pulses of right foot.  Pulmonary/Chest: Effort normal and breath sounds normal. No respiratory distress. She has no wheezes. She has no rales.  Abdominal: Soft. She exhibits no distension. There is no tenderness.  Neurological: She is alert and oriented to person, place, and time.  Skin:  Stump well healed. There are a few areas of echar.  Psychiatric: She has a normal mood and affect.  In good spirits.  Vitals reviewed.  Lab Results  Component Value Date   WBC 15.5* 08/19/2015   HGB 8.0* 08/19/2015   HCT 25.1*  08/19/2015   PLT 427 08/19/2015   GLUCOSE 121* 08/18/2015   CHOL 273* 07/22/2015   TRIG 390.0* 07/22/2015   HDL 34.50* 07/22/2015   LDLDIRECT 167.0 07/22/2015   ALT 34 08/14/2015   AST 29 08/14/2015   NA 135 08/18/2015   K 3.4* 08/18/2015   CL 105 08/18/2015   CREATININE 0.98 08/18/2015   BUN 11 08/18/2015   CO2 23 08/18/2015   HGBA1C 11.9* 08/03/2015   MICROALBUR 15.0* 07/27/2015   Assessment & Plan:   Problem List Items Addressed This Visit    Type 2 diabetes, uncontrolled, with neuropathy ([place]) - Primary    Control appears to be good per CBG record. Will continue Novolin. Based on A1C today will consider stopping Amaryl.       Relevant Medications   lisinopril (PRINIVIL,ZESTRIL) 2.5 MG tablet    Other Relevant Orders   Comp Met (CMET)   HgB A1c   Microalbumin / creatinine urine ratio   Preventative health care    Prevnar given today. Rx for Tdap and Zostavax given today. Order for Bone density placed. Referral to GI for colonoscopy placed. Has seen Ophthalmology. Will obtain records.      Relevant Orders   Pneumococcal conjugate vaccine 13-valent (Completed)   Peripheral vascular disease ([place])    Has follow up with Dr. Lucky Cowboy (Vascular) today. She will need further intervention regarding her Right leg. Left leg doing well. Compliant with ASA and Statin.       Relevant Medications   sotalol (BETAPACE) 80 MG tablet   lisinopril (PRINIVIL,ZESTRIL) 2.5 MG tablet   Hyperlipidemia    Unclear of control. Lipid panel today. Tolerating statin.      Relevant Medications   sotalol (BETAPACE) 80 MG tablet   lisinopril (PRINIVIL,ZESTRIL) 2.5 MG tablet   Other Relevant Orders   Lipid panel   HTN (hypertension)    BP well controlled. Given diabetes and other comorbidities, starting low dose Lisinopril today.      Relevant Medications   sotalol (BETAPACE) 80 MG tablet   lisinopril (PRINIVIL,ZESTRIL) 2.5 MG tablet   Atrial fibrillation (HCC)    Patient is in sinus rhythm today. Rate controlled on Sotalol. I refilled this today. I'm not sure of her entire history regarding afib (chronic vs paroxysmal). She has has cardioversion in the past.  It is unclear to me why she is not on anticoagulation.  Her CHADSVASC score is 5. She was previously managed by Dr. Clayborn Bigness. I am unable to see her records. Referring to Cardiology today Revonda Standard) for guidance. Additionally, given PVD patient would likely benefit from stress testing in the near future.      Relevant Medications   sotalol (BETAPACE) 80 MG tablet   lisinopril (PRINIVIL,ZESTRIL) 2.5 MG tablet   Other Relevant Orders   Ambulatory referral to Cardiology   Anemia    CBC today.      Relevant Orders   CBC      Other Visit Diagnoses    Estrogen deficiency        Relevant Orders    DG Bone Density    Encounter for screening colonoscopy        Relevant Orders    Ambulatory referral to Gastroenterology       Meds ordered this encounter  Medications  . zoster vaccine live, PF, (ZOSTAVAX) 50388 UNT/0.65ML injection    Sig: Inject 19,400 Units into the skin once.    Dispense:  1 each    Refill:  0  . Tdap (  BOOSTRIX) 5-2.5-18.5 LF-MCG/0.5 injection    Sig: Inject 0.5 mLs into the muscle once.    Dispense:  0.5 mL    Refill:  0  . sotalol (BETAPACE) 80 MG tablet    Sig: Take 1 tablet (80 mg total) by mouth 2 (two) times daily.    Dispense:  60 tablet    Refill:  0  . lisinopril (PRINIVIL,ZESTRIL) 2.5 MG tablet    Sig: Take 1 tablet (2.5 mg total) by mouth daily.    Dispense:  90 tablet    Refill:  1   Follow-up: 1-3 months  [name] DO [facility]

## 2015-11-13 NOTE — Patient Instructions (Signed)
We will call with your lab results.  Follow up in 1-3 months.  Take care  Dr. Lacinda Axon

## 2015-11-13 NOTE — Assessment & Plan Note (Signed)
Unclear of control. Lipid panel today. Tolerating statin.

## 2015-11-13 NOTE — Assessment & Plan Note (Signed)
CBC today.  

## 2015-11-13 NOTE — Assessment & Plan Note (Addendum)
Patient is in sinus rhythm today. Rate controlled on Sotalol. I refilled this today. I'm not sure of her entire history regarding afib (chronic vs paroxysmal). She has has cardioversion in the past.  It is unclear to me why she is not on anticoagulation.  Her CHADSVASC score is 5. She was previously managed by Dr. Clayborn Bigness. I am unable to see her records. Referring to Cardiology today Revonda Standard) for guidance. Additionally, given PVD patient would likely benefit from stress testing in the near future.

## 2015-12-03 ENCOUNTER — Telehealth: Payer: Self-pay | Admitting: Family Medicine

## 2015-12-03 ENCOUNTER — Other Ambulatory Visit: Payer: Self-pay | Admitting: Family Medicine

## 2015-12-03 ENCOUNTER — Encounter: Payer: Self-pay | Admitting: Family Medicine

## 2015-12-03 DIAGNOSIS — R7989 Other specified abnormal findings of blood chemistry: Secondary | ICD-10-CM

## 2015-12-03 DIAGNOSIS — R945 Abnormal results of liver function studies: Secondary | ICD-10-CM

## 2015-12-03 DIAGNOSIS — D72829 Elevated white blood cell count, unspecified: Secondary | ICD-10-CM

## 2015-12-03 NOTE — Telephone Encounter (Signed)
What labs and DX? Patient coming in for labs 12/04/15 Thanks.

## 2015-12-03 NOTE — Telephone Encounter (Signed)
Orders placed.

## 2015-12-04 ENCOUNTER — Other Ambulatory Visit (INDEPENDENT_AMBULATORY_CARE_PROVIDER_SITE_OTHER): Payer: Medicare Other

## 2015-12-04 DIAGNOSIS — R7989 Other specified abnormal findings of blood chemistry: Secondary | ICD-10-CM

## 2015-12-04 DIAGNOSIS — R945 Abnormal results of liver function studies: Secondary | ICD-10-CM

## 2015-12-04 DIAGNOSIS — D72829 Elevated white blood cell count, unspecified: Secondary | ICD-10-CM | POA: Diagnosis not present

## 2015-12-04 LAB — COMPREHENSIVE METABOLIC PANEL
ALBUMIN: 4.1 g/dL (ref 3.5–5.2)
ALK PHOS: 51 U/L (ref 39–117)
ALT: 27 U/L (ref 0–35)
AST: 23 U/L (ref 0–37)
BUN: 13 mg/dL (ref 6–23)
CALCIUM: 9.9 mg/dL (ref 8.4–10.5)
CHLORIDE: 101 meq/L (ref 96–112)
CO2: 30 mEq/L (ref 19–32)
Creatinine, Ser: 0.61 mg/dL (ref 0.40–1.20)
GFR: 104.19 mL/min (ref >60.00)
Glucose, Bld: 171 mg/dL — ABNORMAL HIGH (ref 70–99)
POTASSIUM: 4.5 meq/L (ref 3.5–5.1)
Sodium: 137 mEq/L (ref 135–145)
TOTAL PROTEIN: 7 g/dL (ref 6.0–8.3)
Total Bilirubin: 0.4 mg/dL (ref 0.2–1.2)

## 2015-12-04 LAB — CBC
HEMATOCRIT: 36.1 % (ref 36.0–46.0)
HEMOGLOBIN: 11.8 g/dL — AB (ref 12.0–15.0)
MCHC: 32.6 g/dL (ref 30.0–36.0)
MCV: 77.2 fl — AB (ref 78.0–100.0)
PLATELETS: 202 10*3/uL (ref 150.0–400.0)
RBC: 4.68 Mil/uL (ref 3.87–5.11)
RDW: 17.7 % — ABNORMAL HIGH (ref 11.5–15.5)
WBC: 15.7 10*3/uL — AB (ref 4.0–10.5)

## 2015-12-15 ENCOUNTER — Encounter: Payer: Self-pay | Admitting: Internal Medicine

## 2015-12-18 ENCOUNTER — Other Ambulatory Visit: Payer: Self-pay | Admitting: Family Medicine

## 2015-12-29 ENCOUNTER — Ambulatory Visit: Payer: Medicare Other

## 2016-01-12 ENCOUNTER — Encounter: Payer: Self-pay | Admitting: Cardiovascular Disease

## 2016-01-12 ENCOUNTER — Ambulatory Visit (INDEPENDENT_AMBULATORY_CARE_PROVIDER_SITE_OTHER): Payer: Medicare Other | Admitting: Cardiovascular Disease

## 2016-01-12 VITALS — BP 140/60 | HR 105 | Ht 62.0 in | Wt 133.2 lb

## 2016-01-12 DIAGNOSIS — I4891 Unspecified atrial fibrillation: Secondary | ICD-10-CM

## 2016-01-12 DIAGNOSIS — I10 Essential (primary) hypertension: Secondary | ICD-10-CM

## 2016-01-12 DIAGNOSIS — Z87891 Personal history of nicotine dependence: Secondary | ICD-10-CM

## 2016-01-12 DIAGNOSIS — E785 Hyperlipidemia, unspecified: Secondary | ICD-10-CM

## 2016-01-12 DIAGNOSIS — E1165 Type 2 diabetes mellitus with hyperglycemia: Secondary | ICD-10-CM

## 2016-01-12 DIAGNOSIS — IMO0002 Reserved for concepts with insufficient information to code with codable children: Secondary | ICD-10-CM

## 2016-01-12 DIAGNOSIS — E1140 Type 2 diabetes mellitus with diabetic neuropathy, unspecified: Secondary | ICD-10-CM

## 2016-01-12 DIAGNOSIS — I739 Peripheral vascular disease, unspecified: Secondary | ICD-10-CM

## 2016-01-12 MED ORDER — SOTALOL HCL 120 MG PO TABS: 120.0000 mg | ORAL_TABLET | Freq: Two times a day (BID) | ORAL | Status: DC

## 2016-01-12 NOTE — Patient Instructions (Signed)
You are doing well.  Please increase the sotolol up to 120 mg twice a day Think about blood thinners, talk with pharmacy Xarelto or eliquis  Please call us if you have new issues that need to be addressed before your next appt.  Your physician wants you to follow-up in: 1 month.

## 2016-01-12 NOTE — Assessment & Plan Note (Signed)
Dramatic improvement in her lipid panel. If she continues to lose weight, potentially might be up to cut her Lipitor in half   Total encounter time more than 45 minutes  Greater than 50% was spent in counseling and coordination of care with the patient

## 2016-01-12 NOTE — Addendum Note (Signed)
Addended by: Minna Merritts on: 01/12/2016 07:32 PM   Modules accepted: Level of Service

## 2016-01-12 NOTE — Assessment & Plan Note (Signed)
Increase in sotalol as detailed up to 120 mg twice a day for rate and rhythm control

## 2016-01-12 NOTE — Assessment & Plan Note (Signed)
We have congratulated her on her dramatic improvement in hemoglobin A1c as well as lipids She continues to lose weight

## 2016-01-12 NOTE — Progress Notes (Signed)
Patient ID: [name], female    DOB: 01/15/1951, 65 y.o.   MRN: [id number]  HPI Comments:  [name] is a pleasant 58 short woman with long history of diabetes, complications including amputation , prior history of smoking who stopped in Apr 01, 2009,  Previous history of  Atrial fibrillation and cardioversion November 2010,  Who presents to establish care and for discussion of her atrial fibrillation.   She reports that she had significant stress in 04/01/09, mother died, father had significant medical issues. She feels that the stress of the situation contributed to episode of atrial fibrillation.  She was placed in the hospital for several days, had cardioversion.  She does not feel that she has had any further episodes of atrial fibrillation since that time but she is not certain.  Occasionally feels her heart beat.   Feels that sotalol has worked very well for her,  Takes 80 mg twice a day.   Reports that she had dramatic weight loss in 04/01/09   Over the past year, continues to lose weight, changed her diet  Cholesterol has dropped from 273 down to 106.  Hemoglobin A1c has dropped from 12 down to 6.4.   She is scheduled to have a prosthesis of her leg later this week.   She was treated for warfarin the past for her atrial fibrillation, did not feel well on warfarin and this was stopped years ago.  No further discussion by outside cardiologist on other alternatives  She prefers not to start anticoagulation if possible   EKG shows sinus tachycardia with frequent APCs, rate 105 bpm     Allergies  Allergen Reactions  . Codeine Nausea And Vomiting  . Tape Rash    Current Outpatient Prescriptions on File Prior to Visit  Medication Sig Dispense Refill  . acetaminophen (TYLENOL) 500 MG tablet Take 500 mg by mouth every 6 (six) hours as needed.    Marland Kitchen aspirin EC 81 MG tablet Take 81 mg by mouth 2 (two) times daily.    Marland Kitchen atorvastatin (LIPITOR) 40 MG tablet Take 40 mg by mouth at bedtime.    .  Biotin 2500 MCG CAPS Take 2,500 mcg by mouth.    . Calcium-Magnesium-Vitamin D (CALCIUM 500 PO) Take 500 mg by mouth 2 (two) times daily.    . Cholecalciferol (D3 ADULT PO) Take 1,000 Units by mouth once.    . diphenhydrAMINE (SOMINEX) 25 MG tablet Take 25 mg by mouth 2 (two) times daily.    Marland Kitchen gabapentin (NEURONTIN) 100 MG capsule TAKE ONE CAPSULE THREE TIMES A DAY 90 capsule 1  . glimepiride (AMARYL) 2 MG tablet Take 1 tablet (2 mg total) by mouth daily before breakfast. 90 tablet 1  . glucose blood test strip Coutour strips bauer brand Diagnosis code E11.40 50 each 12  . insulin NPH Human (HUMULIN N) 100 UNIT/ML injection Inject 0.06 mLs (6 Units total) into the skin 2 (two) times daily before a meal. 10 mL 11  . lisinopril (PRINIVIL,ZESTRIL) 2.5 MG tablet Take 1 tablet (2.5 mg total) by mouth daily. 90 tablet 1  . Omega-3 Fatty Acids (FISH OIL) 1200 MG CAPS Take 300 mg by mouth 2 (two) times daily.     Marland Kitchen pyridOXINE (VITAMIN B-6) 100 MG tablet Take 100 mg by mouth at bedtime.     . Tdap (BOOSTRIX) 5-2.5-18.5 LF-MCG/0.5 injection Inject 0.5 mLs into the muscle once. 0.5 mL 0  . vitamin B-12 (CYANOCOBALAMIN) 1000 MCG tablet Take 1,000 mcg by mouth  2 (two) times daily.    . vitamin E 400 UNIT capsule Take 400 Units by mouth 2 (two) times daily.    Marland Kitchen zoster vaccine live, PF, (ZOSTAVAX) 16109 UNT/0.65ML injection Inject 19,400 Units into the skin once. 1 each 0   No current facility-administered medications on file prior to visit.    Past Medical History  Diagnosis Date  . DM type 2 (diabetes mellitus, type 2) ([place])   . Diabetic neuropathy ([place])   . Atrial fibrillation ([place])   . Chicken pox   . Multiple gastric ulcers   . Hypertension   . History of hiatal hernia   . Anemia   . Arthritis     Past Surgical History  Procedure Laterality Date  . Foot surgery Right   . Cataract extraction    . Adenoidectomy      5 yrs  . Abdominal hysterectomy    . Oophorectomy    . Cardioversion     . Endovenous ablation saphenous vein w/ laser    . Cataract extraction w/phaco Left 07/16/2015    Procedure: CATARACT EXTRACTION PHACO AND INTRAOCULAR LENS PLACEMENT (IOC);  Surgeon: Lyla Glassing, MD;  Location: [facility] ORS;  Service: Ophthalmology;  Laterality: Left;  [place]: 01:16.4   . Peripheral vascular catheterization Left 08/05/2015    Procedure: Lower Extremity Angiography;  Surgeon: Algernon Huxley, MD;  Location: [place];  Service: Cardiovascular;  Laterality: Left;  . Peripheral vascular catheterization  08/05/2015    Procedure: Lower Extremity Intervention;  Surgeon: Algernon Huxley, MD;  Location: [place] [facility];  Service: Cardiovascular;;  . Amputation Left 08/17/2015    Procedure: AMPUTATION BELOW KNEE;  Surgeon: Algernon Huxley, MD;  Location: [facility] ORS;  Service: Vascular;  Laterality: Left;    Social History  reports that she quit smoking about 6 years ago. Her smoking use included Cigarettes. She has a 40 pack-year smoking history. She does not have any smokeless tobacco history on file. She reports that she drinks about 0.6 oz of alcohol per week. She reports that she does not use illicit drugs.  Family History family history includes Arthritis in her father, maternal grandmother, and mother; Breast cancer in her maternal aunt; Breast cancer (age of onset: 25) in her mother; COPD in her mother; Diabetes in her father and mother; Heart disease in her father and mother; Hyperlipidemia in her father and mother; Hypertension in her father and mother; Stroke in her paternal grandmother.    Review of Systems  Constitutional: Negative.   Respiratory: Negative.   Cardiovascular: Negative.   Gastrointestinal: Negative.   Musculoskeletal: Negative.   Neurological: Negative.   Hematological: Negative.   Psychiatric/Behavioral: Negative.   All other systems reviewed and are negative.   BP 140/60 mmHg  Pulse 105  Ht 5\' 2"  (1.575 m)  Wt 133 lb 4 oz (60.442 kg)  BMI 24.37  kg/m2  Physical Exam  Constitutional: She is oriented to person, place, and time. She appears well-developed and well-nourished.  Amputation below the knee  HENT:  Head: Normocephalic.  Nose: Nose normal.  Mouth/Throat: Oropharynx is clear and moist.  Eyes: Conjunctivae are normal. Pupils are equal, round, and reactive to light.  Neck: Normal range of motion. Neck supple. No JVD present. Carotid bruit is present.  Cardiovascular: Normal rate, regular rhythm, normal heart sounds and intact distal pulses.  Exam reveals no gallop and no friction rub.   No murmur heard. Pulmonary/Chest: Effort normal and breath sounds normal. No respiratory  distress. She has no wheezes. She has no rales. She exhibits no tenderness.  Abdominal: Soft. Bowel sounds are normal. She exhibits no distension. There is no tenderness.  Musculoskeletal: Normal range of motion. She exhibits no edema or tenderness.  Lymphadenopathy:    She has no cervical adenopathy.  Neurological: She is alert and oriented to person, place, and time. Coordination normal.  Skin: Skin is warm and dry. No rash noted. No erythema.  Psychiatric: She has a normal mood and affect. Her behavior is normal. Judgment and thought content normal.

## 2016-01-12 NOTE — Assessment & Plan Note (Signed)
High risk of coronary artery disease given prior smoking history, diabetes, hyperlipidemia We did discuss CT coronary calcium scoring We will discuss this with her again in follow-up Currently with no symptoms of chest pain concerning for angina, stress test has not been ordered

## 2016-01-12 NOTE — Assessment & Plan Note (Signed)
Prior history of lower extremity arterial disease, amputation Cholesterol well within goal

## 2016-01-12 NOTE — Assessment & Plan Note (Signed)
Long discussion today concerning risk and benefit of anticoagulation. She has a CHADS VASC of at least, possibly 5. She does not want warfarin. She will research the NOACs, costs etc. but prefers no anticoagulation at this time. Heart rate is elevated, and there is room on her blood pressure and we will increase her sotalol up to 120 mg twice a day. We discussed 30 day monitors as well as home monitoring. She prefers to monitor heart rate on her own for periods of tachycardia. Heart rate is currently irregular secondary to ectopy.

## 2016-01-18 ENCOUNTER — Other Ambulatory Visit: Payer: Self-pay

## 2016-01-18 ENCOUNTER — Ambulatory Visit: Payer: Medicare Other

## 2016-02-09 ENCOUNTER — Ambulatory Visit: Payer: Self-pay | Admitting: Nurse Practitioner

## 2016-02-29 ENCOUNTER — Ambulatory Visit
Admission: RE | Admit: 2016-02-29 | Discharge: 2016-02-29 | Disposition: A | Discharge status: Home / Self Care | Payer: Medicare Other | Source: Ambulatory Visit | Attending: Family Medicine | Admitting: Family Medicine

## 2016-02-29 DIAGNOSIS — N63 Unspecified lump in unspecified breast: Secondary | ICD-10-CM

## 2016-02-29 DIAGNOSIS — R928 Other abnormal and inconclusive findings on diagnostic imaging of breast: Secondary | ICD-10-CM

## 2016-02-29 DIAGNOSIS — M8580 Other specified disorders of bone density and structure, unspecified site: Secondary | ICD-10-CM | POA: Insufficient documentation

## 2016-02-29 DIAGNOSIS — Z13820 Encounter for screening for osteoporosis: Secondary | ICD-10-CM | POA: Insufficient documentation

## 2016-02-29 DIAGNOSIS — E2839 Other primary ovarian failure: Secondary | ICD-10-CM

## 2016-03-11 ENCOUNTER — Ambulatory Visit (INDEPENDENT_AMBULATORY_CARE_PROVIDER_SITE_OTHER): Payer: Medicare Other | Admitting: Cardiovascular Disease

## 2016-03-11 ENCOUNTER — Encounter: Payer: Self-pay | Admitting: Cardiovascular Disease

## 2016-03-11 VITALS — BP 179/84 | HR 64 | Ht 62.0 in | Wt 153.0 lb

## 2016-03-11 DIAGNOSIS — E1165 Type 2 diabetes mellitus with hyperglycemia: Secondary | ICD-10-CM

## 2016-03-11 DIAGNOSIS — I739 Peripheral vascular disease, unspecified: Secondary | ICD-10-CM

## 2016-03-11 DIAGNOSIS — I10 Essential (primary) hypertension: Secondary | ICD-10-CM

## 2016-03-11 DIAGNOSIS — E785 Hyperlipidemia, unspecified: Secondary | ICD-10-CM

## 2016-03-11 DIAGNOSIS — I480 Paroxysmal atrial fibrillation: Secondary | ICD-10-CM

## 2016-03-11 DIAGNOSIS — E1140 Type 2 diabetes mellitus with diabetic neuropathy, unspecified: Secondary | ICD-10-CM

## 2016-03-11 DIAGNOSIS — Z87891 Personal history of nicotine dependence: Secondary | ICD-10-CM

## 2016-03-11 DIAGNOSIS — IMO0002 Reserved for concepts with insufficient information to code with codable children: Secondary | ICD-10-CM

## 2016-03-11 NOTE — Patient Instructions (Signed)
You are doing well. No medication changes were made.  Please monitor your blood pressure at home Goal is <140 on the top number  Please call us if you have new issues that need to be addressed before your next appt.  Your physician wants you to follow-up in: 6 months.  You will receive a reminder letter in the mail two months in advance. If you don't receive a letter, please call our office to schedule the follow-up appointment.

## 2016-03-11 NOTE — Progress Notes (Signed)
Patient ID: [name], female    DOB: Feb 10, 1951, 65 y.o.   MRN: [id number]  HPI Comments:  [name] is a pleasant 65 yo woman with long history of poorly controlled diabetes, complications including amputation , prior history of smoking who stopped in 2009/03/16, hyperlipidemia, Previous history of  Atrial fibrillation and cardioversion November 2010,  Who presents for routine follow-up of her hyperlipidemia and atrial fibrillation  In follow-up, she reports that she is doing well, she has a new prosthesis for her leg, learning to walk and steady her gait Diabetes numbers dramatically improved, hemoglobin A1c from 12 down to 6.4 Cholesterol numbers improved, total cholesterol 260, now down to 100 She has had dramatic weight loss Overall she is very happy with her turnaround, wonders why she did not do this years ago On further discussion again, she does not want anticoagulation for atrial fibrillation  On her last clinic visit, she increased sotalol up to 120 mg twice a day  Blood pressure elevated on today's visit She does report blood pressure numbers are better at home  No EKG done on today's visit  Other past medical history reviewed  She reports that she had significant stress in 2009-03-16, mother died, father had significant medical issues. She feels that the stress of the situation contributed to episode of atrial fibrillation.  She was placed in the hospital for several days, had cardioversion.  She does not feel that she has had any further episodes of atrial fibrillation since that time but she is not certain.   She was treated for warfarin the past for her atrial fibrillation, did not feel well on warfarin and this was stopped years ago.  No further discussion by outside cardiologist on other alternatives  She prefers not to start anticoagulation if possible    Allergies  Allergen Reactions  . Codeine Nausea And Vomiting  . Tape Rash    Current Outpatient Prescriptions on  File Prior to Visit  Medication Sig Dispense Refill  . acetaminophen (TYLENOL) 500 MG tablet Take 500 mg by mouth every 6 (six) hours as needed.    Marland Kitchen aspirin EC 81 MG tablet Take 81 mg by mouth 2 (two) times daily.    Marland Kitchen atorvastatin (LIPITOR) 40 MG tablet Take 40 mg by mouth at bedtime.    . Biotin 2500 MCG CAPS Take 2,500 mcg by mouth.    . Calcium-Magnesium-Vitamin D (CALCIUM 500 PO) Take 500 mg by mouth 2 (two) times daily.    . Cholecalciferol (D3 ADULT PO) Take 1,000 Units by mouth once.    . diphenhydrAMINE (SOMINEX) 25 MG tablet Take 25 mg by mouth 2 (two) times daily.    Marland Kitchen gabapentin (NEURONTIN) 100 MG capsule TAKE ONE CAPSULE THREE TIMES A DAY 90 capsule 1  . insulin NPH Human (HUMULIN N) 100 UNIT/ML injection Inject 0.06 mLs (6 Units total) into the skin 2 (two) times daily before a meal. 10 mL 11  . Omega-3 Fatty Acids (FISH OIL) 1200 MG CAPS Take 300 mg by mouth 2 (two) times daily.     Marland Kitchen pyridOXINE (VITAMIN B-6) 100 MG tablet Take 100 mg by mouth at bedtime.     . sotalol (BETAPACE) 120 MG tablet Take 1 tablet (120 mg total) by mouth 2 (two) times daily. 180 tablet 3  . vitamin B-12 (CYANOCOBALAMIN) 1000 MCG tablet Take 1,000 mcg by mouth 2 (two) times daily.    . vitamin E 400 UNIT capsule Take 400 Units by mouth 2 (  two) times daily.     No current facility-administered medications on file prior to visit.    Past Medical History  Diagnosis Date  . DM type 2 (diabetes mellitus, type 2) ([place])   . Diabetic neuropathy ([place])   . Atrial fibrillation ([place])   . Chicken pox   . Multiple gastric ulcers   . Hypertension   . History of hiatal hernia   . Anemia   . Arthritis     Past Surgical History  Procedure Laterality Date  . Foot surgery Right   . Cataract extraction    . Adenoidectomy      5 yrs  . Abdominal hysterectomy    . Oophorectomy    . Cardioversion    . Endovenous ablation saphenous vein w/ laser    . Cataract extraction w/phaco Left 07/16/2015    Procedure:  CATARACT EXTRACTION PHACO AND INTRAOCULAR LENS PLACEMENT (IOC);  Surgeon: Lyla Glassing, MD;  Location: [facility];  Service: Ophthalmology;  Laterality: Left;  [place]: 01:16.4   . Peripheral vascular catheterization Left 08/05/2015    Procedure: Lower Extremity Angiography;  Surgeon: Algernon Huxley, MD;  Location: [facility];  Service: Cardiovascular;  Laterality: Left;  . Peripheral vascular catheterization  08/05/2015    Procedure: Lower Extremity Intervention;  Surgeon: Algernon Huxley, MD;  Location: [facility];  Service: Cardiovascular;;  . Amputation Left 08/17/2015    Procedure: AMPUTATION BELOW KNEE;  Surgeon: Algernon Huxley, MD;  Location: [facility];  Service: Vascular;  Laterality: Left;    Social History  reports that she quit smoking about 6 years ago. Her smoking use included Cigarettes. She has a 40 pack-year smoking history. She does not have any smokeless tobacco history on file. She reports that she drinks about 0.6 oz of alcohol per week. She reports that she does not use illicit drugs.  Family History family history includes Arthritis in her father, maternal grandmother, and mother; Breast cancer in her maternal aunt; Breast cancer (age of onset: 75) in her mother; COPD in her mother; Diabetes in her father and mother; Heart disease in her father and mother; Hyperlipidemia in her father and mother; Hypertension in her father and mother; Stroke in her paternal grandmother.    Review of Systems  Constitutional: Negative.   Respiratory: Negative.   Cardiovascular: Negative.   Gastrointestinal: Negative.   Musculoskeletal: Positive for gait problem.  Neurological: Negative.   Hematological: Negative.   Psychiatric/Behavioral: Negative.   All other systems reviewed and are negative.   BP 179/84 mmHg  Pulse 64  Ht 5\' 2"  (1.575 m)  Wt 153 lb (69.4 kg)  BMI 27.98 kg/m2  SpO2 99%  Physical Exam  Constitutional: She is oriented to person, place, and time. She appears  well-developed and well-nourished.  Amputation below the knee  HENT:  Head: Normocephalic.  Nose: Nose normal.  Mouth/Throat: Oropharynx is clear and moist.  Eyes: Conjunctivae are normal. Pupils are equal, round, and reactive to light.  Neck: Normal range of motion. Neck supple. No JVD present. Carotid bruit is present.  Cardiovascular: Normal rate, regular rhythm, normal heart sounds and intact distal pulses.  Exam reveals no gallop and no friction rub.   No murmur heard. Pulmonary/Chest: Effort normal and breath sounds normal. No respiratory distress. She has no wheezes. She has no rales. She exhibits no tenderness.  Abdominal: Soft. Bowel sounds are normal. She exhibits no distension. There is no tenderness.  Musculoskeletal: Normal range of motion. She exhibits  no edema or tenderness.  Lymphadenopathy:    She has no cervical adenopathy.  Neurological: She is alert and oriented to person, place, and time. Coordination normal.  Skin: Skin is warm and dry. No rash noted. No erythema.  Psychiatric: She has a normal mood and affect. Her behavior is normal. Judgment and thought content normal.

## 2016-03-11 NOTE — Assessment & Plan Note (Signed)
Blood pressure is elevated on today's visit. On recheck systolic pressure still around 160. Recommended she closely monitor blood pressure at home and call our office if this continues to run high She reports that working with therapist at home, systolic pressure has been in the 130 range If blood pressure does run high, could start ACE or ARB

## 2016-03-11 NOTE — Assessment & Plan Note (Signed)
Cholesterol is at goal on the current lipid regimen. No changes to the medications were made.    Total encounter time more than 25 minutes  Greater than 50% was spent in counseling and coordination of care with the patient  

## 2016-03-11 NOTE — Assessment & Plan Note (Signed)
Long discussion with her again about anticoagulation, she is not interested in anticoagulation She does understand risk of stroke from paroxysmal atrial fibrillation We'll continue sotalol 120 mg daily

## 2016-03-11 NOTE — Assessment & Plan Note (Signed)
Lower extremity arterial disease, followed by Dr. Lucky Cowboy Stressed importance of aggressive diabetes and cholesterol control Currently at goal

## 2016-03-11 NOTE — Assessment & Plan Note (Signed)
We have encouraged her to continue to work on weaning her cigarettes and smoking cessation. She will continue to work on this and does not want any assistance with chantix.  

## 2016-03-11 NOTE — Assessment & Plan Note (Signed)
Dramatic improvement of her hemoglobin A1c with weight loss, medications

## 2016-03-24 ENCOUNTER — Other Ambulatory Visit: Payer: Self-pay | Admitting: Family Medicine

## 2016-03-25 ENCOUNTER — Other Ambulatory Visit: Payer: Self-pay | Admitting: Family Medicine

## 2016-05-25 LAB — HM DIABETES EYE EXAM

## 2016-05-27 ENCOUNTER — Encounter: Payer: Self-pay | Admitting: Family Medicine

## 2016-07-20 ENCOUNTER — Encounter: Payer: Self-pay | Admitting: Family Medicine

## 2016-07-20 ENCOUNTER — Telehealth: Payer: Self-pay | Admitting: Family Medicine

## 2016-07-20 NOTE — Telephone Encounter (Signed)
[name] called saying she needs the following paperwork:   1. A Handicap Plaque - she'll try to get paperwork from the [facility] and bring it in.  2. A letter stating due to her disability, she needs her mailbox moved to her door.  3. A letter of clearance so she can participate in an aquatic pool program stating she understands her limitations and that she can participate. (NOTE: The letter can be printed or emailed to Shelli Scott [[email or web address]] per Ms. Banh.   In Addition: She's asking if she can have her labs drawn the day of her CPE or if they need to be drawn beforehand.  Please advise. Pt's ph# [phone] Thank you.

## 2016-07-20 NOTE — Telephone Encounter (Signed)
Documents done.  Given to [employer].

## 2016-07-20 NOTE — Telephone Encounter (Signed)
Please advise 

## 2016-07-20 NOTE — Telephone Encounter (Signed)
Please let patient know its upfront.

## 2016-08-11 ENCOUNTER — Other Ambulatory Visit: Payer: Self-pay | Admitting: Family Medicine

## 2016-08-12 ENCOUNTER — Ambulatory Visit (INDEPENDENT_AMBULATORY_CARE_PROVIDER_SITE_OTHER): Payer: Medicare Other | Admitting: Family Medicine

## 2016-08-12 ENCOUNTER — Encounter: Payer: Self-pay | Admitting: Family Medicine

## 2016-08-12 ENCOUNTER — Other Ambulatory Visit: Payer: Self-pay | Admitting: Family Medicine

## 2016-08-12 ENCOUNTER — Other Ambulatory Visit: Payer: Self-pay

## 2016-08-12 ENCOUNTER — Telehealth: Payer: Self-pay | Admitting: Family Medicine

## 2016-08-12 VITALS — BP 140/80 | HR 60 | Ht 63.0 in | Wt 163.0 lb

## 2016-08-12 DIAGNOSIS — E1149 Type 2 diabetes mellitus with other diabetic neurological complication: Secondary | ICD-10-CM

## 2016-08-12 DIAGNOSIS — D649 Anemia, unspecified: Secondary | ICD-10-CM | POA: Diagnosis not present

## 2016-08-12 DIAGNOSIS — D72829 Elevated white blood cell count, unspecified: Secondary | ICD-10-CM

## 2016-08-12 DIAGNOSIS — I480 Paroxysmal atrial fibrillation: Secondary | ICD-10-CM | POA: Diagnosis not present

## 2016-08-12 DIAGNOSIS — I10 Essential (primary) hypertension: Secondary | ICD-10-CM | POA: Diagnosis not present

## 2016-08-12 DIAGNOSIS — Z1211 Encounter for screening for malignant neoplasm of colon: Secondary | ICD-10-CM

## 2016-08-12 DIAGNOSIS — E785 Hyperlipidemia, unspecified: Secondary | ICD-10-CM

## 2016-08-12 DIAGNOSIS — I739 Peripheral vascular disease, unspecified: Secondary | ICD-10-CM

## 2016-08-12 LAB — COMPREHENSIVE METABOLIC PANEL
ALBUMIN: 4.1 g/dL (ref 3.5–5.2)
ALK PHOS: 57 U/L (ref 39–117)
ALT: 15 U/L (ref 0–35)
AST: 17 U/L (ref 0–37)
BILIRUBIN TOTAL: 0.6 mg/dL (ref 0.2–1.2)
BUN: 22 mg/dL (ref 6–23)
CALCIUM: 9.5 mg/dL (ref 8.4–10.5)
CO2: 30 mEq/L (ref 19–32)
CREATININE: 0.8 mg/dL (ref 0.40–1.20)
Chloride: 101 mEq/L (ref 96–112)
GFR: 76.04 mL/min (ref >60.00)
Glucose, Bld: 193 mg/dL — ABNORMAL HIGH (ref 70–99)
Potassium: 5.1 mEq/L (ref 3.5–5.1)
Sodium: 136 mEq/L (ref 135–145)
TOTAL PROTEIN: 7.4 g/dL (ref 6.0–8.3)

## 2016-08-12 LAB — CBC
HCT: 38.5 % (ref 36.0–46.0)
HEMOGLOBIN: 12.6 g/dL (ref 12.0–15.0)
MCHC: 32.8 g/dL (ref 30.0–36.0)
MCV: 81.5 fl (ref 78.0–100.0)
PLATELETS: 242 10*3/uL (ref 150.0–400.0)
RBC: 4.72 Mil/uL (ref 3.87–5.11)
RDW: 15 % (ref 11.5–15.5)
WBC: 27.3 10*3/uL (ref 4.0–10.5)

## 2016-08-12 LAB — LIPID PANEL
CHOL/HDL RATIO: 4
CHOLESTEROL: 138 mg/dL (ref 0–200)
HDL: 35 mg/dL — AB (ref >39.00)
LDL Cholesterol: 73 mg/dL (ref 0–99)
NonHDL: 103.31
TRIGLYCERIDES: 154 mg/dL — AB (ref 0.0–149.0)
VLDL: 30.8 mg/dL (ref 0.0–40.0)

## 2016-08-12 LAB — HEMOGLOBIN A1C: Hgb A1c MFr Bld: 7.4 % — ABNORMAL HIGH (ref 4.6–6.5)

## 2016-08-12 NOTE — Telephone Encounter (Signed)
CRITICAL LAB:    WBC: 27.3  Please advise what you'd like to do for pt.

## 2016-08-12 NOTE — Assessment & Plan Note (Signed)
Stable. Will consider adding ACEI if patient's BP stays up.

## 2016-08-12 NOTE — Patient Instructions (Signed)
I will order the cologuard.  We will call with your lab results.  Follow up in 6 months.  Take care  Dr. Lacinda Axon

## 2016-08-12 NOTE — Progress Notes (Signed)
 Subjective:  Patient ID: [name], female    DOB: 08/10/1951  Age: 65 y.o. MRN: [id number]  CC: Follow up.  HPI [name] is a 65 y.o. female presents to the clinic today for follow up.  DM  Blood sugars readings - 130-140's per patient.   Hypoglycemia - No.   Medications - NPH 6 units TID.  Adverse effects - None.  Compliance - Yes. Preventative care  Eye exam - Has had eye exam.  Foot exam - Followed by podiatry.  Last A1C - In need of today.   Urine microalbumin - Up to date.  On Aspirin.  On statin.   A fib  Seeing cardiology.  On Sotalol.  Declines anticoagulation.  PVD  Stable per report.  Followed by Vascular.   Need records.  HTN  Stable at this time.   HLD  Well controlled on Lipitor.  Needs labs.  PMH, Surgical Hx, Family Hx, Social History reviewed and updated as below.  Past Medical History:  Diagnosis Date  . Anemia   . Arthritis   . Atrial fibrillation ([facility])   . Chicken pox   . Diabetic neuropathy ([facility])   . DM type 2 (diabetes mellitus, type 2) ([facility])   . History of gastric ulcer   . Hypertension   . Peripheral vascular disease ([facility])    Past Surgical History:  Procedure Laterality Date  . ABDOMINAL HYSTERECTOMY    . ADENOIDECTOMY     5 yrs  . AMPUTATION Left 08/17/2015   Procedure: AMPUTATION BELOW KNEE;  Surgeon: Jason S Dew, MD;  Location: [facility];  Service: Vascular;  Laterality: Left;  . CARDIOVERSION    . CATARACT EXTRACTION    . CATARACT EXTRACTION W/PHACO Left 07/16/2015   Procedure: CATARACT EXTRACTION PHACO AND INTRAOCULAR LENS PLACEMENT (IOC);  Surgeon: Nisha Mukherjee, MD;  Location: [facility];  Service: Ophthalmology;  Laterality: Left;  [place]: 01:16.4   . ENDOVENOUS ABLATION SAPHENOUS VEIN W/ LASER    . FOOT SURGERY Right   . OOPHORECTOMY    . PERIPHERAL VASCULAR CATHETERIZATION Left 08/05/2015   Procedure: Lower Extremity Angiography;  Surgeon: Jason S Dew, MD;  Location: [facility];   Service: Cardiovascular;  Laterality: Left;  . PERIPHERAL VASCULAR CATHETERIZATION  08/05/2015   Procedure: Lower Extremity Intervention;  Surgeon: Jason S Dew, MD;  Location: [facility];  Service: Cardiovascular;;   Family History  Problem Relation Age of Onset  . Arthritis Mother   . Hyperlipidemia Mother   . Hypertension Mother   . Breast cancer Mother 66  . Diabetes Mother   . COPD Mother   . Heart disease Mother   . Arthritis Father   . Hyperlipidemia Father   . Hypertension Father   . Diabetes Father   . Heart disease Father   . Arthritis Maternal Grandmother   . Stroke Paternal Grandmother   . Breast cancer Maternal Aunt    Social History  Substance Use Topics  . Smoking status: Former Smoker    Packs/day: 1.00    Years: 40.00    Types: Cigarettes    Quit date: 07/21/2009  . Smokeless tobacco: Not on file  . Alcohol use 0.6 oz/week    1 Glasses of wine per week     Comment: occas   Review of Systems  Constitutional: Negative.   Respiratory: Negative.   Cardiovascular: Negative.    Objective:   Today's Vitals: BP 140/80   Pulse 60   Ht   5' 3" (1.6 m)   Wt 163 lb (73.9 kg)   BMI 28.87 kg/m   Physical Exam  Constitutional: She is oriented to person, place, and time. She appears well-developed. No distress.  Cardiovascular: An irregularly irregular rhythm present.  Pulmonary/Chest: Effort normal. She has no wheezes. She has no rales.  Neurological: She is alert and oriented to person, place, and time.  Psychiatric: She has a normal mood and affect.  Vitals reviewed.  Assessment & Plan:   Problem List Items Addressed This Visit    DM (diabetes mellitus), type 2 with neurological complications ([facility])    Stable at this time. Continue insulin. A1c today.      Relevant Orders   HgB A1c   Atrial fibrillation ([facility])    Stable. Continue sotalol. Declined anticoagulation. On aspirin.      Hyperlipidemia    Stable on Lipitor. Labs today.       Relevant Orders   Lipid Profile   HTN (hypertension)    Stable. Will consider adding ACEI if patient's BP stays up.      Relevant Orders   Comp Met (CMET)   Peripheral vascular disease ([facility])    Stable. Need records. Continue Aspirin and Statin.      Anemia   Relevant Orders   CBC    Other Visit Diagnoses   None.     Outpatient Encounter Prescriptions as of 08/12/2016  Medication Sig  . acetaminophen (TYLENOL) 500 MG tablet Take 500 mg by mouth every 6 (six) hours as needed.  . aspirin EC 81 MG tablet Take 81 mg by mouth 2 (two) times daily.  . atorvastatin (LIPITOR) 40 MG tablet Take 40 mg by mouth at bedtime.  . Biotin 2500 MCG CAPS Take 2,500 mcg by mouth.  . Calcium-Magnesium-Vitamin D (CALCIUM 500 PO) Take 500 mg by mouth 2 (two) times daily.  . Cholecalciferol (D3 ADULT PO) Take 1,000 Units by mouth once.  . diphenhydrAMINE (SOMINEX) 25 MG tablet Take 25 mg by mouth 2 (two) times daily.  . gabapentin (NEURONTIN) 100 MG capsule TAKE ONE CAPSULE THREE TIMES A DAY  . insulin NPH Human (HUMULIN N) 100 UNIT/ML injection Inject 0.06 mLs (6 Units total) into the skin 2 (two) times daily before a meal.  . Omega-3 Fatty Acids (FISH OIL) 1200 MG CAPS Take 300 mg by mouth 2 (two) times daily.   . pyridOXINE (VITAMIN B-6) 100 MG tablet Take 100 mg by mouth at bedtime.   . sotalol (BETAPACE) 120 MG tablet Take 1 tablet (120 mg total) by mouth 2 (two) times daily.  . vitamin B-12 (CYANOCOBALAMIN) 1000 MCG tablet Take 1,000 mcg by mouth 2 (two) times daily.  . vitamin E 400 UNIT capsule Take 400 Units by mouth 2 (two) times daily.   No facility-administered encounter medications on file as of 08/12/2016.     Follow-up: Return in about 6 months (around 02/09/2017).    DO  [facility] [place]     

## 2016-08-12 NOTE — Assessment & Plan Note (Signed)
Stable. Continue sotalol. Declined anticoagulation. On aspirin.

## 2016-08-12 NOTE — Telephone Encounter (Signed)
Provider spoke with patient via phone.

## 2016-08-12 NOTE — Assessment & Plan Note (Signed)
Stable at this time. Continue insulin. A1c today.

## 2016-08-12 NOTE — Addendum Note (Signed)
Addended by: Frutoso Chase A on: 08/12/2016 02:40 PM   Modules accepted: Orders

## 2016-08-12 NOTE — Telephone Encounter (Signed)
Acknowledge. Will try and add on diff and smear. Will discuss with patient.

## 2016-08-12 NOTE — Assessment & Plan Note (Signed)
Stable. Need records. Continue Aspirin and Statin.

## 2016-08-12 NOTE — Assessment & Plan Note (Signed)
Stable on Lipitor. Labs today.

## 2016-08-15 ENCOUNTER — Telehealth: Payer: Self-pay | Admitting: Family Medicine

## 2016-08-15 ENCOUNTER — Other Ambulatory Visit: Payer: Self-pay | Admitting: Family Medicine

## 2016-08-15 LAB — PATHOLOGIST SMEAR REVIEW

## 2016-08-15 NOTE — Telephone Encounter (Signed)
Called pt to see if she could come in tomorrow at 4 p.m. Spot is on hold on providers schedule. It is okay for a 15 minute for her.

## 2016-08-15 NOTE — Telephone Encounter (Signed)
Pt stated that she's feeling better, she rather keep her original appt date and time

## 2016-08-15 NOTE — Telephone Encounter (Signed)
Will see her Wed.

## 2016-08-15 NOTE — Telephone Encounter (Signed)
LVM to call office to schedule

## 2016-08-17 ENCOUNTER — Ambulatory Visit (INDEPENDENT_AMBULATORY_CARE_PROVIDER_SITE_OTHER): Payer: Medicare Other | Admitting: Family Medicine

## 2016-08-17 ENCOUNTER — Encounter: Payer: Self-pay | Admitting: Family Medicine

## 2016-08-17 ENCOUNTER — Other Ambulatory Visit: Payer: Self-pay | Admitting: Family Medicine

## 2016-08-17 ENCOUNTER — Telehealth: Payer: Self-pay | Admitting: Family Medicine

## 2016-08-17 VITALS — BP 157/87 | HR 109 | Wt 166.5 lb

## 2016-08-17 DIAGNOSIS — Z1211 Encounter for screening for malignant neoplasm of colon: Secondary | ICD-10-CM | POA: Diagnosis not present

## 2016-08-17 DIAGNOSIS — R799 Abnormal finding of blood chemistry, unspecified: Secondary | ICD-10-CM

## 2016-08-17 DIAGNOSIS — D72829 Elevated white blood cell count, unspecified: Secondary | ICD-10-CM

## 2016-08-17 LAB — CBC WITH DIFFERENTIAL/PLATELET
BASOS ABS: 0.1 10*3/uL (ref 0.0–0.1)
Basophils Relative: 0.2 % (ref 0.0–3.0)
EOS ABS: 0.3 10*3/uL (ref 0.0–0.7)
Eosinophils Relative: 1 % (ref 0.0–5.0)
HCT: 38.1 % (ref 36.0–46.0)
HEMOGLOBIN: 12.6 g/dL (ref 12.0–15.0)
Lymphocytes Relative: 60 % — ABNORMAL HIGH (ref 12.0–46.0)
Lymphs Abs: 15.9 10*3/uL — ABNORMAL HIGH (ref 0.7–4.0)
MCHC: 33.2 g/dL (ref 30.0–36.0)
MCV: 80.9 fl (ref 78.0–100.0)
MONO ABS: 1 10*3/uL (ref 0.1–1.0)
Monocytes Relative: 3.7 % (ref 3.0–12.0)
Neutro Abs: 9.3 10*3/uL — ABNORMAL HIGH (ref 1.4–7.7)
Neutrophils Relative %: 35.1 % — ABNORMAL LOW (ref 43.0–77.0)
Platelets: 262 10*3/uL (ref 150.0–400.0)
RBC: 4.71 Mil/uL (ref 3.87–5.11)
RDW: 14.4 % (ref 11.5–15.5)

## 2016-08-17 NOTE — Telephone Encounter (Signed)
Noted. Awaiting final results.

## 2016-08-17 NOTE — Addendum Note (Signed)
Addended by: Coral Spikes on: 08/17/2016 02:56 PM   Modules accepted: Orders

## 2016-08-17 NOTE — Progress Notes (Signed)
Pre visit review using our clinic review tool, if applicable. No additional management support is needed unless otherwise documented below in the visit note. 

## 2016-08-17 NOTE — Telephone Encounter (Signed)
[facility] called with Critical WBC of 26.4.

## 2016-08-17 NOTE — Progress Notes (Signed)
Subjective:  Patient ID: [name], female    DOB: [birth year]  Age: 65 y.o. MRN: [id number]  CC: Follow up regarding recent labs  HPI:  65 year old female with DM 2 with complications (peripheral artery disease and neuropathy), atrial fibrillation, hypertension, hyperlipidemia presents for follow-up regarding her recent blood work.  Abnormal CBC/Peripheral smear  At our last visit patient I obtained a CBC as patient has a history of anemia.  Her anemia has resolved.  However, her white blood cell count was 27.3.  Peripheral smear was obtained and revealed lymphocytosis and atypical lymphocytes suggestive of a lymphoproliferative process.  It was recommended that patient have further studies - immunophenotyping by flow cytometry.  Patient presents today to discuss her results.  Patient states that she's feeling well.  No recent fever, chills, weight loss, night sweats.  No reports of recent illness or infection. No foot wounds or ulcers. She has no concerns at this time.  Social Hx    Social History   Social History  . Marital status: Divorced    Spouse name: N/A  . Number of children: N/A  . Years of education: N/A   Social History Main Topics  . Smoking status: Former Smoker    Packs/day: 1.00    Years: 40.00    Types: Cigarettes    Quit date: 07/21/2009  . Smokeless tobacco: None  . Alcohol use 0.6 oz/week    1 Glasses of wine per week     Comment: occas  . Drug use: No  . Sexual activity: Not Currently   Other Topics Concern  . None   Social History Narrative   ** Merged History Encounter **        Review of Systems  Constitutional: Negative.   Respiratory: Negative for cough and shortness of breath.    Objective:  BP (!) 157/87 (BP Location: Left Arm, Patient Position: Sitting, Cuff Size: Normal)   Pulse (!) 109   Wt 166 lb 8 oz (75.5 kg)   BMI 29.49 kg/m   BP/Weight 08/17/2016 [id number]  Systolic BP [id number]  Diastolic BP  87 80 84  Wt. (Lbs) 166.5 163 153  BMI 29.49 28.87 27.98   Physical Exam  Constitutional: She is oriented to person, place, and time. She appears well-developed. No distress.  Cardiovascular: Normal rate and regular rhythm.   Pulmonary/Chest: Effort normal and breath sounds normal.  Neurological: She is alert and oriented to person, place, and time.  Psychiatric: She has a normal mood and affect.  Vitals reviewed.  Lab Results  Component Value Date   WBC 27.3 Repeated and verified X2. (HH) 08/12/2016   HGB 12.6 08/12/2016   HCT 38.5 08/12/2016   PLT 242.0 08/12/2016   GLUCOSE 193 (H) 08/12/2016   CHOL 138 08/12/2016   TRIG 154.0 (H) 08/12/2016   HDL 35.00 (L) 08/12/2016   LDLDIRECT 167.0 07/22/2015   LDLCALC 73 08/12/2016   ALT 15 08/12/2016   AST 17 08/12/2016   NA 136 08/12/2016   K 5.1 08/12/2016   CL 101 08/12/2016   CREATININE 0.80 08/12/2016   BUN 22 08/12/2016   CO2 30 08/12/2016   HGBA1C 7.4 (H) 08/12/2016   MICROALBUR 10.7 (H) 11/13/2015    Assessment & Plan:   Problem List Items Addressed This Visit    Abnormal blood smear    New problem. Uncertain etiology & prognosis at this time. Blood smear suggestive of underlying leukemia/lymphoma. Obtaining repeat CBC today and flow cytometry. Going  ahead and sending to hematology/oncology for further evaluation following lab results.       Relevant Orders   CBC with Differential/Platelet    Other Visit Diagnoses    Elevated WBC count       Relevant Orders   CBC with Differential/Platelet     Follow-up: PRN  [place]

## 2016-08-17 NOTE — Assessment & Plan Note (Addendum)
New problem. Uncertain etiology & prognosis at this time. Blood smear suggestive of underlying leukemia/lymphoma. Obtaining repeat CBC today and flow cytometry. Going ahead and sending to hematology/oncology for further evaluation following lab results.

## 2016-08-19 ENCOUNTER — Telehealth: Payer: Self-pay | Admitting: *Deleted

## 2016-08-19 NOTE — Telephone Encounter (Signed)
Dr. Lacinda Axon what were the results?

## 2016-08-19 NOTE — Telephone Encounter (Signed)
Patient has been notified

## 2016-08-19 NOTE — Telephone Encounter (Signed)
Not back yet. 

## 2016-08-19 NOTE — Telephone Encounter (Signed)
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

## 2016-08-21 LAB — LEUKEMIA/LYMPHOMA EVALUATION PANEL
Number of Markers:: 22
VIABILITY: 98 %

## 2016-08-22 ENCOUNTER — Other Ambulatory Visit: Payer: Self-pay | Admitting: Family Medicine

## 2016-08-22 DIAGNOSIS — C9110 Chronic lymphocytic leukemia of B-cell type not having achieved remission: Secondary | ICD-10-CM

## 2016-08-23 ENCOUNTER — Ambulatory Visit: Payer: Self-pay | Admitting: Oncology

## 2016-08-25 DIAGNOSIS — C9110 Chronic lymphocytic leukemia of B-cell type not having achieved remission: Secondary | ICD-10-CM | POA: Insufficient documentation

## 2016-08-25 NOTE — Progress Notes (Signed)
[place]  [phone] [phone]  ID: [name] OB: Apr 15, 1951  MR#: [id number]  JSE#:[id number]  Patient Care Team: Coral Spikes, DO as PCP - General (Family Medicine) Coral Spikes, DO (Family Medicine) Minna Merritts, MD as Consulting Physician (Cardiology)  CHIEF COMPLAINT: CLL, Rail stage 0  INTERVAL HISTORY: Patient is a 65 year old female who was noted to have a persistently elevated white blood cell count with lymphocyte predominance. Subsequent flow cytometry confirmed CLL. Currently, she is anxious but otherwise feels well. She has no neurologic complaints. She denies any fevers, night sweats, or weight loss. She has noted no lymphadenopathy. She has no chest pain or shortness of breath. She denies any nausea, vomiting, constipation, or diarrhea. She has no urinary complaints. Patient otherwise feels well and offers no further specific complaints.  REVIEW OF SYSTEMS:   Review of Systems  Constitutional: Negative.  Negative for fever, malaise/fatigue and weight loss.  Respiratory: Negative.  Negative for cough and shortness of breath.   Cardiovascular: Negative.  Negative for chest pain and leg swelling.  Gastrointestinal: Negative.  Negative for abdominal pain.  Genitourinary: Negative.   Musculoskeletal: Negative.   Neurological: Negative.  Negative for weakness.  Psychiatric/Behavioral: The patient is nervous/anxious.     As per HPI. Otherwise, a complete review of systems is negative.  PAST MEDICAL HISTORY: Past Medical History:  Diagnosis Date  . Anemia   . Arthritis   . Atrial fibrillation ([place])   . Chicken pox   . Diabetic neuropathy ([place])   . DM type 2 (diabetes mellitus, type 2) ([place])   . History of gastric ulcer   . Hypertension   . Peripheral vascular disease ([place])     PAST SURGICAL HISTORY: Past Surgical History:  Procedure Laterality Date  . ABDOMINAL HYSTERECTOMY    . ADENOIDECTOMY     5 yrs  .  AMPUTATION Left 08/17/2015   Procedure: AMPUTATION BELOW KNEE;  Surgeon: Algernon Huxley, MD;  Location: [facility] OR[facility];  Service: Vascular;  Laterality: Left;  . CARDIOVERSION    . CATARACT EXTRACTION    . CATARACT EXTRACTION W/PHACO Left 07/16/2015   Procedure: CATARACT EXTRACTION PHACO AND INTRAOCULAR LENS PLACEMENT (IOC);  Surgeon: Lyla Glassing, MD;  Location: [facility] ORS;  Service: Ophthalmology;  Laterality: Left;  [place]: 01:16.4   . ENDOVENOUS ABLATION SAPHENOUS VEIN W/ LASER    . FOOT SURGERY Right   . OOPHORECTOMY    . PERIPHERAL VASCULAR CATHETERIZATION Left 08/05/2015   Procedure: Lower Extremity Angiography;  Surgeon: Algernon Huxley, MD;  Location: [place] [facility];  Service: Cardiovascular;  Laterality: Left;  . PERIPHERAL VASCULAR CATHETERIZATION  08/05/2015   Procedure: Lower Extremity Intervention;  Surgeon: Algernon Huxley, MD;  Location: [place] [facility];  Service: Cardiovascular;;    FAMILY HISTORY: Family History  Problem Relation Age of Onset  . Arthritis Mother   . Hyperlipidemia Mother   . Hypertension Mother   . Breast cancer Mother 89  . Diabetes Mother   . COPD Mother   . Heart disease Mother   . Arthritis Father   . Hyperlipidemia Father   . Hypertension Father   . Diabetes Father   . Heart disease Father   . Arthritis Maternal Grandmother   . Stroke Paternal Grandmother   . Breast cancer Maternal Aunt     ADVANCED DIRECTIVES (Y/N):  N  HEALTH MAINTENANCE: Social History  Substance Use Topics  . Smoking status: Former Smoker    Packs/day:  1.00    Years: 40.00    Types: Cigarettes    Quit date: 07/21/2009  . Smokeless tobacco: Not on file  . Alcohol use 0.6 oz/week    1 Glasses of wine per week     Comment: occas     Colonoscopy:  PAP:  Bone density:  Lipid panel:  Allergies  Allergen Reactions  . Codeine Nausea And Vomiting  . Tape Rash    Current Outpatient Prescriptions  Medication Sig Dispense Refill  . acetaminophen (TYLENOL) 500 MG  tablet Take 500 mg by mouth every 6 (six) hours as needed.    Marland Kitchen aspirin EC 81 MG tablet Take 81 mg by mouth 2 (two) times daily.    Marland Kitchen atorvastatin (LIPITOR) 40 MG tablet Take 40 mg by mouth at bedtime.    . Biotin 2500 MCG CAPS Take 2,500 mcg by mouth.    . Calcium-Magnesium-Vitamin D (CALCIUM 500 PO) Take 500 mg by mouth 2 (two) times daily.    . Cholecalciferol (D3 ADULT PO) Take 1,000 Units by mouth once.    . diphenhydrAMINE (SOMINEX) 25 MG tablet Take 25 mg by mouth 2 (two) times daily.    Marland Kitchen gabapentin (NEURONTIN) 100 MG capsule TAKE 1 CAPSULE BY MOUTH THREE TIMES DAILY 90 capsule 1  . insulin NPH Human (HUMULIN N) 100 UNIT/ML injection Inject 0.06 mLs (6 Units total) into the skin 2 (two) times daily before a meal. 10 mL 11  . Omega-3 Fatty Acids (FISH OIL) 1200 MG CAPS Take 300 mg by mouth 2 (two) times daily.     Marland Kitchen pyridOXINE (VITAMIN B-6) 100 MG tablet Take 100 mg by mouth at bedtime.     . sotalol (BETAPACE) 120 MG tablet Take 1 tablet (120 mg total) by mouth 2 (two) times daily. 180 tablet 3  . vitamin B-12 (CYANOCOBALAMIN) 1000 MCG tablet Take 1,000 mcg by mouth 2 (two) times daily.    . vitamin E 400 UNIT capsule Take 400 Units by mouth 2 (two) times daily.     No current facility-administered medications for this visit.     OBJECTIVE: Vitals:   08/26/16 1344  BP: (!) 194/81  Pulse: (!) 106  Resp: 20  Temp: 99.4 F (37.4 C)     Body mass index is 29.52 kg/m.    ECOG FS:0 - Asymptomatic  General: Well-developed, well-nourished, no acute distress. Eyes: Pink conjunctiva, anicteric sclera. HEENT: Normocephalic, moist mucous membranes, clear oropharnyx. Lungs: Clear to auscultation bilaterally. Heart: Regular rate and rhythm. No rubs, murmurs, or gallops. Abdomen: Soft, nontender, nondistended. No organomegaly noted, normoactive bowel sounds. Musculoskeletal: No edema, cyanosis, or clubbing. Neuro: Alert, answering all questions appropriately. Cranial nerves grossly  intact. Skin: No rashes or petechiae noted. Psych: Normal affect. Lymphatics: No cervical, calvicular, axillary or inguinal LAD.   LAB RESULTS:  Lab Results  Component Value Date   NA 136 08/12/2016   K 5.1 08/12/2016   CL 101 08/12/2016   CO2 30 08/12/2016   GLUCOSE 193 (H) 08/12/2016   BUN 22 08/12/2016   CREATININE 0.80 08/12/2016   CALCIUM 9.5 08/12/2016   PROT 7.4 08/12/2016   ALBUMIN 4.1 08/12/2016   AST 17 08/12/2016   ALT 15 08/12/2016   ALKPHOS 57 08/12/2016   BILITOT 0.6 08/12/2016   GFRNONAA 59 (L) 08/18/2015   GFRAA >60 08/18/2015    Lab Results  Component Value Date   WBC 26.4 Repeated and verified X2. (HH) 08/17/2016   NEUTROABS 9.3 (H) 08/17/2016   HGB  12.6 08/17/2016   HCT 38.1 08/17/2016   MCV 80.9 08/17/2016   PLT 262.0 08/17/2016     STUDIES: No results found.  ASSESSMENT: CLL, Rai stage 0  PLAN:    1. CLL, Rai stage 0:  Diagnosis confirmed by flow cytometry. Patient does not require bone marrow biopsy. Will consider CT of the neck, chest, abdomen, pelvis to complete the staging process in the future if there is concern for progression of disease. No intervention is needed at this time. Patient will return to clinic in 3 months with repeat laboratory work and further evaluation. 2. Hypertension: Likely secondary to patient's increased anxiety. Continue monitoring and treatment per primary care.  Approximately 45 minutes was spent in discussion of which greater than 50% was consultation.  Patient expressed understanding and was in agreement with this plan. She also understands that She can call clinic at any time with any questions, concerns, or complaints.   Lloyd Huger, MD   08/31/2016 8:49 AM

## 2016-08-26 ENCOUNTER — Inpatient Hospital Stay: Payer: Medicare Other | Attending: Oncology | Admitting: Oncology

## 2016-08-26 DIAGNOSIS — F419 Anxiety disorder, unspecified: Secondary | ICD-10-CM | POA: Diagnosis not present

## 2016-08-26 DIAGNOSIS — Z87891 Personal history of nicotine dependence: Secondary | ICD-10-CM | POA: Insufficient documentation

## 2016-08-26 DIAGNOSIS — I10 Essential (primary) hypertension: Secondary | ICD-10-CM | POA: Insufficient documentation

## 2016-08-26 DIAGNOSIS — C9110 Chronic lymphocytic leukemia of B-cell type not having achieved remission: Secondary | ICD-10-CM | POA: Insufficient documentation

## 2016-08-26 DIAGNOSIS — I4891 Unspecified atrial fibrillation: Secondary | ICD-10-CM | POA: Insufficient documentation

## 2016-08-26 DIAGNOSIS — E119 Type 2 diabetes mellitus without complications: Secondary | ICD-10-CM | POA: Insufficient documentation

## 2016-08-26 DIAGNOSIS — I739 Peripheral vascular disease, unspecified: Secondary | ICD-10-CM | POA: Diagnosis not present

## 2016-08-26 DIAGNOSIS — Z79899 Other long term (current) drug therapy: Secondary | ICD-10-CM

## 2016-08-26 DIAGNOSIS — M1990 Unspecified osteoarthritis, unspecified site: Secondary | ICD-10-CM | POA: Diagnosis not present

## 2016-08-26 NOTE — Progress Notes (Signed)
Patient here today for initial evaluation for elevated white blood cell count. Patient denies fevers or any other symptoms at home, reports she is feeling well.

## 2016-09-06 ENCOUNTER — Other Ambulatory Visit (INDEPENDENT_AMBULATORY_CARE_PROVIDER_SITE_OTHER): Payer: Self-pay | Admitting: Vascular Surgery

## 2016-09-06 ENCOUNTER — Encounter (INDEPENDENT_AMBULATORY_CARE_PROVIDER_SITE_OTHER): Payer: Medicare Other

## 2016-09-06 ENCOUNTER — Encounter (INDEPENDENT_AMBULATORY_CARE_PROVIDER_SITE_OTHER): Payer: Self-pay | Admitting: Vascular Surgery

## 2016-09-06 ENCOUNTER — Ambulatory Visit (INDEPENDENT_AMBULATORY_CARE_PROVIDER_SITE_OTHER): Payer: Medicare Other | Admitting: Vascular Surgery

## 2016-09-06 VITALS — BP 138/65 | HR 72 | Resp 16 | Ht 63.0 in | Wt 152.0 lb

## 2016-09-06 DIAGNOSIS — E785 Hyperlipidemia, unspecified: Secondary | ICD-10-CM

## 2016-09-06 DIAGNOSIS — C9110 Chronic lymphocytic leukemia of B-cell type not having achieved remission: Secondary | ICD-10-CM

## 2016-09-06 DIAGNOSIS — I739 Peripheral vascular disease, unspecified: Secondary | ICD-10-CM

## 2016-09-06 DIAGNOSIS — E1149 Type 2 diabetes mellitus with other diabetic neurological complication: Secondary | ICD-10-CM

## 2016-09-06 DIAGNOSIS — Z89512 Acquired absence of left leg below knee: Secondary | ICD-10-CM | POA: Diagnosis not present

## 2016-09-06 DIAGNOSIS — I10 Essential (primary) hypertension: Secondary | ICD-10-CM

## 2016-09-06 NOTE — Assessment & Plan Note (Signed)
blood glucose control important in reducing the progression of atherosclerotic disease. Also, involved in wound healing. On appropriate medications.  

## 2016-09-06 NOTE — Progress Notes (Signed)
MRN : [id number]  [name] is a 65 y.o. (05-20-1951) female who presents with chief complaint of  Chief Complaint  Patient presents with  . Re-evaluation    ultrasound follow up  .  History of Present Illness: Patient returns in follow-up of her peripheral vascular disease. 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 She has done quite well from this and is now walking with a prosthesis. She reports significant neuropathy and arthritis in the right leg, but no ischemic rest pain, ulceration, or infection. ABI today reasonably stable at 0.73 on the right. Right digital pressure is 99 with a pretty good waveform. Overall, her perfusion is stable although the ABI slightly down from what it was 6 months ago  Current Outpatient Prescriptions  Medication Sig Dispense Refill  . acetaminophen (TYLENOL) 500 MG tablet Take 500 mg by mouth every 6 (six) hours as needed.    Marland Kitchen aspirin EC 81 MG tablet Take 81 mg by mouth 2 (two) times daily.    Marland Kitchen atorvastatin (LIPITOR) 40 MG tablet Take 40 mg by mouth at bedtime.    . Biotin 2500 MCG CAPS Take 2,500 mcg by mouth.    . Calcium-Magnesium-Vitamin D (CALCIUM 500 PO) Take 500 mg by mouth 2 (two) times daily.    . Cholecalciferol (D3 ADULT PO) Take 1,000 Units by mouth once.    . diphenhydrAMINE (SOMINEX) 25 MG tablet Take 25 mg by mouth 2 (two) times daily.    Marland Kitchen gabapentin (NEURONTIN) 100 MG capsule TAKE 1 CAPSULE BY MOUTH THREE TIMES DAILY 90 capsule 1  . insulin NPH Human (HUMULIN N) 100 UNIT/ML injection Inject 0.06 mLs (6 Units total) into the skin 2 (two) times daily before a meal. 10 mL 11  . Omega-3 Fatty Acids (FISH OIL) 1200 MG CAPS Take 300 mg by mouth 2 (two) times daily.     Marland Kitchen pyridOXINE (VITAMIN B-6) 100 MG tablet Take 100 mg by mouth at bedtime.     . sotalol (BETAPACE) 120 MG tablet Take 1 tablet (120 mg total) by mouth 2 (two) times daily. 180 tablet 3  . vitamin B-12 (CYANOCOBALAMIN)  1000 MCG tablet Take 1,000 mcg by mouth 2 (two) times daily.    . vitamin E 400 UNIT capsule Take 400 Units by mouth 2 (two) times daily.     No current facility-administered medications for this visit.     Past Medical History:  Diagnosis Date  . Anemia   . Arthritis   . Atrial fibrillation ([place])   . Chicken pox   . Diabetic neuropathy ([place])   . DM type 2 (diabetes mellitus, type 2) ([place])   . History of gastric ulcer   . Hypertension   . Peripheral vascular disease [facility])     Past Surgical History:  Procedure Laterality Date  . ABDOMINAL HYSTERECTOMY    . ADENOIDECTOMY     5 yrs  . AMPUTATION Left 08/17/2015   Procedure: AMPUTATION BELOW KNEE;  Surgeon: Algernon Huxley, MD;  Location: [facility] OR[facility];  Service: Vascular;  Laterality: Left;  . CARDIOVERSION    . CATARACT EXTRACTION    . CATARACT EXTRACTION W/PHACO Left 07/16/2015   Procedure: CATARACT EXTRACTION PHACO AND INTRAOCULAR LENS PLACEMENT (IOC);  Surgeon: Lyla Glassing, MD;  Location: [facility];  Service: Ophthalmology;  Laterality: Left;  [place]: 01:16.4   . ENDOVENOUS ABLATION SAPHENOUS VEIN W/ LASER    . FOOT SURGERY Right   .  OOPHORECTOMY    . PERIPHERAL VASCULAR CATHETERIZATION Left 08/05/2015   Procedure: Lower Extremity Angiography;  Surgeon: Algernon Huxley, MD;  Location: [place] [facility];  Service: Cardiovascular;  Laterality: Left;  . PERIPHERAL VASCULAR CATHETERIZATION  08/05/2015   Procedure: Lower Extremity Intervention;  Surgeon: Algernon Huxley, MD;  Location: [place] [facility];  Service: Cardiovascular;;    Social History Social History  Substance Use Topics  . Smoking status: Former Smoker    Packs/day: 1.00    Years: 40.00    Types: Cigarettes    Quit date: 07/21/2009  . Smokeless tobacco: Never Used  . Alcohol use 0.6 oz/week    1 Glasses of wine per week     Comment: occas    Family History Family History  Problem Relation Age of Onset  . Arthritis Mother   . Hyperlipidemia Mother   . Hypertension  Mother   . Breast cancer Mother 72  . Diabetes Mother   . COPD Mother   . Heart disease Mother   . Arthritis Father   . Hyperlipidemia Father   . Hypertension Father   . Diabetes Father   . Heart disease Father   . Arthritis Maternal Grandmother   . Stroke Paternal Grandmother   . Breast cancer Maternal Aunt      Allergies  Allergen Reactions  . Codeine Nausea And Vomiting  . Tape Rash     REVIEW OF SYSTEMS (Negative unless checked)  Constitutional: [] Weight loss  [] Fever  [] Chills Cardiac: [] Chest pain   [] Chest pressure   [] Palpitations   [] Shortness of breath when laying flat   [] Shortness of breath at rest   [] Shortness of breath with exertion. Vascular:  [] Pain in legs with walking   [] Pain in legs at rest   [] Pain in legs when laying flat   [] Claudication   [] Pain in feet when walking  [] Pain in feet at rest  [] Pain in feet when laying flat   [] History of DVT   [] Phlebitis   [] Swelling in legs   [] Varicose veins   [] Non-healing ulcers Pulmonary:   [] Uses home oxygen   [] Productive cough   [] Hemoptysis   [] Wheeze  [] COPD   [] Asthma Neurologic:  [] Dizziness  [] Blackouts   [] Seizures   [] History of stroke   [] History of TIA  [] Aphasia   [] Temporary blindness   [] Dysphagia   [] Weakness or numbness in arms   [x] Weakness or numbness in legs Musculoskeletal:  [] Arthritis   [] Joint swelling   [] Joint pain   [] Low back pain Hematologic:  [] Easy bruising  [] Easy bleeding   [] Hypercoagulable state   [] Anemic  [] Hepatitis Gastrointestinal:  [] Blood in stool   [] Vomiting blood  [] Gastroesophageal reflux/heartburn   [] Difficulty swallowing. Genitourinary:  [] Chronic kidney disease   [] Difficult urination  [] Frequent urination  [] Burning with urination   [] Blood in urine Skin:  [] Rashes   [] Ulcers   [] Wounds Psychological:  [] History of anxiety   []  History of major depression.  Physical Examination  Vitals:   09/06/16 1057  BP: 138/65  Pulse: 72  Resp: 16  Weight: 152 lb (68.9 kg)   Height: 5\' 3"  (1.6 m)   Body mass index is 26.93 kg/m. Gen:  WD/WN, NAD Head: [place]/AT, No temporalis wasting. Ear/Nose/Throat: Hearing grossly intact, nares w/o erythema or drainage, trachea midline Eyes: PERRLA, EOMI. Sclera non-icteric Neck: Supple, no nuchal rigidity.  No JVD.  Pulmonary:  Good air movement Cardiac: RRR, normal S1, S2 Vascular:  Vessel Right Left  Radial Palpable  Palpable  Ulnar Palpable Palpable  Brachial Palpable Palpable  Carotid Palpable, without bruit Palpable, without bruit  Aorta Not palpable N/A  Femoral Palpable Palpable  Popliteal Palpable Not Palpable  PT 1+, Palpable Not Palpable  DP Faintly Palpable Not Palpable   Gastrointestinal: soft, non-tender/non-distended. No guarding/reflex.  Musculoskeletal: M/S 5/5 throughout.  Left below-knee prosthesis in place Neurologic: CN 2-12 intact. Pain and light touch intact in extremities.  Symmetrical.  Speech is fluent. Motor exam as listed above. Psychiatric: Judgment intact, Mood & affect appropriate for pt's clinical situation. Dermatologic: No rashes or ulcers noted.  No cellulitis or open wounds. Lymph : No Cervical, Axillary, or Inguinal lymphadenopathy.     CBC Lab Results  Component Value Date   WBC 26.4 Repeated and verified X2. (HH) 08/17/2016   HGB 12.6 08/17/2016   HCT 38.1 08/17/2016   MCV 80.9 08/17/2016   PLT 262.0 08/17/2016    BMET    Component Value Date/Time   NA 136 08/12/2016 1022   K 5.1 08/12/2016 1022   CL 101 08/12/2016 1022   CO2 30 08/12/2016 1022   GLUCOSE 193 (H) 08/12/2016 1022   BUN 22 08/12/2016 1022   CREATININE 0.80 08/12/2016 1022   CALCIUM 9.5 08/12/2016 1022   GFRNONAA 59 (L) 08/18/2015 0400   GFRAA >60 08/18/2015 0400   CrCl cannot be calculated (Patient's most recent lab result is older than the maximum 21 days allowed.).  COAG No results found for: INR, PROTIME  Radiology No results found.    Assessment/Plan HTN (hypertension) blood  pressure control important in reducing the progression of atherosclerotic disease. On appropriate oral medications.  DM (diabetes mellitus), type 2 with neurological complications (HCC) blood glucose control important in reducing the progression of atherosclerotic disease. Also, involved in wound healing. On appropriate medications.   S/P BKA (below knee amputation) ([place]) Doing well, walking with a prosthesis  Peripheral vascular disease ([place]) ABI today reasonably stable at 0.73 on the right. Right digital pressure is 99 with a pretty good waveform. Overall, her perfusion is stable although the ABI slightly down from what it was 6 months ago. I will plan on checking her again in 6 months given her known extensive vascular disease and previous amputation on the left. Continue current medical regimen.    Leotis Pain, MD  09/06/2016 11:49 AM    This note was created with Dragon medical transcription system.  Any errors from dictation are purely unintentional

## 2016-09-06 NOTE — Assessment & Plan Note (Signed)
Doing well, walking with a prosthesis

## 2016-09-06 NOTE — Assessment & Plan Note (Signed)
ABI today reasonably stable at 0.73 on the right. Right digital pressure is 99 with a pretty good waveform. Overall, her perfusion is stable although the ABI slightly down from what it was 6 months ago. I will plan on checking her again in 6 months given her known extensive vascular disease and previous amputation on the left. Continue current medical regimen.

## 2016-09-06 NOTE — Assessment & Plan Note (Signed)
blood pressure control important in reducing the progression of atherosclerotic disease. On appropriate oral medications.  

## 2016-09-26 ENCOUNTER — Telehealth: Payer: Self-pay | Admitting: Family Medicine

## 2016-09-26 ENCOUNTER — Other Ambulatory Visit: Payer: Self-pay | Admitting: Family Medicine

## 2016-09-26 DIAGNOSIS — R928 Other abnormal and inconclusive findings on diagnostic imaging of breast: Secondary | ICD-10-CM

## 2016-09-26 DIAGNOSIS — Z1211 Encounter for screening for malignant neoplasm of colon: Secondary | ICD-10-CM

## 2016-09-26 NOTE — Telephone Encounter (Signed)
Pt called and stated that she needs a referral for additional testing from her Mammogram. Pt also stated that she has not received the at home kit for the colonoscopy. Please advise, thank you!  Call pt [phone]

## 2016-09-26 NOTE — Telephone Encounter (Signed)
Orders in 

## 2016-09-26 NOTE — Telephone Encounter (Signed)
Via [employer] web site this was never place.

## 2016-09-26 NOTE — Addendum Note (Signed)
Addended by: Coral Spikes on: 09/26/2016 01:16 PM   Modules accepted: Orders

## 2016-09-29 ENCOUNTER — Other Ambulatory Visit: Payer: Self-pay

## 2016-09-29 ENCOUNTER — Encounter: Payer: Self-pay | Admitting: Family Medicine

## 2016-09-29 DIAGNOSIS — Z1239 Encounter for other screening for malignant neoplasm of breast: Secondary | ICD-10-CM

## 2016-10-04 IMAGING — US US BREAST LTD UNI RIGHT INC AXILLA
1 series · 6 of 6 positions shown · non-contrast
Comparison: Previous exam(s).

CLINICAL DATA: Patient presents for a diagnostic right breast
examination to evaluate a mass over the upper central right breast
seen on a screening exam in July 2015 as patient never returned
for additional images.

EXAM:
2D DIGITAL DIAGNOSTIC right MAMMOGRAM WITH CAD AND ADJUNCT TOMO
ULTRASOUND right BREAST

[Series 1: us breast ltd uni right inc axilla · 0.05mm/px · 6 of 6 slices shown]
[im 1/6]
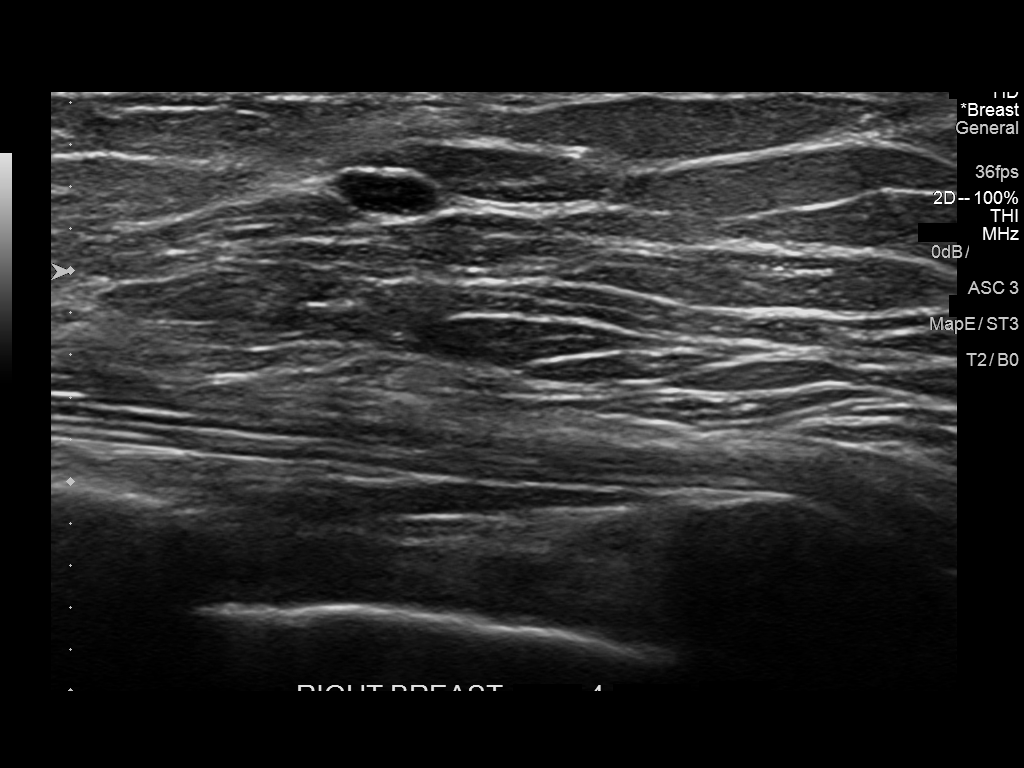
[im 2/6]
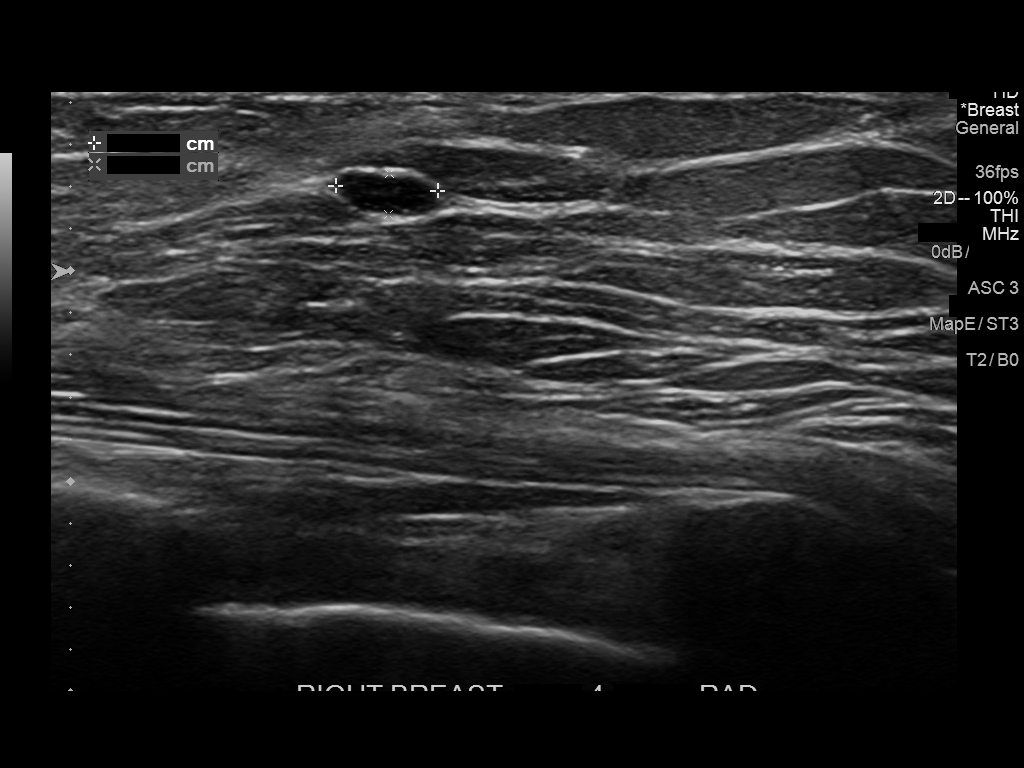
[im 3/6]
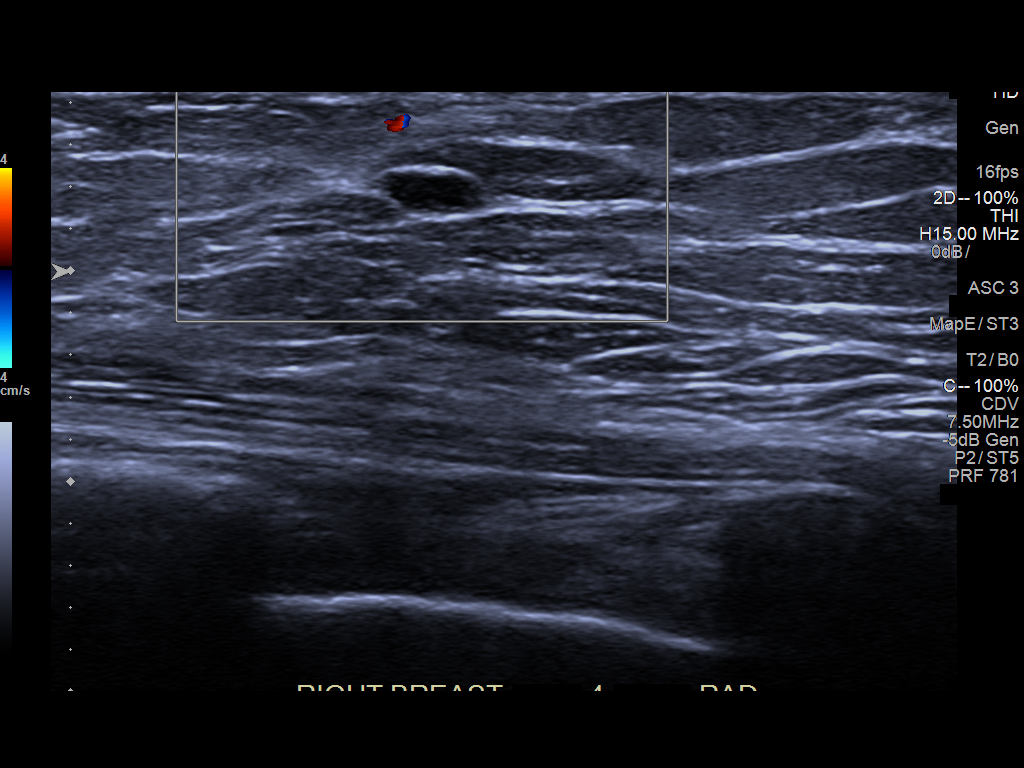
[im 4/6]
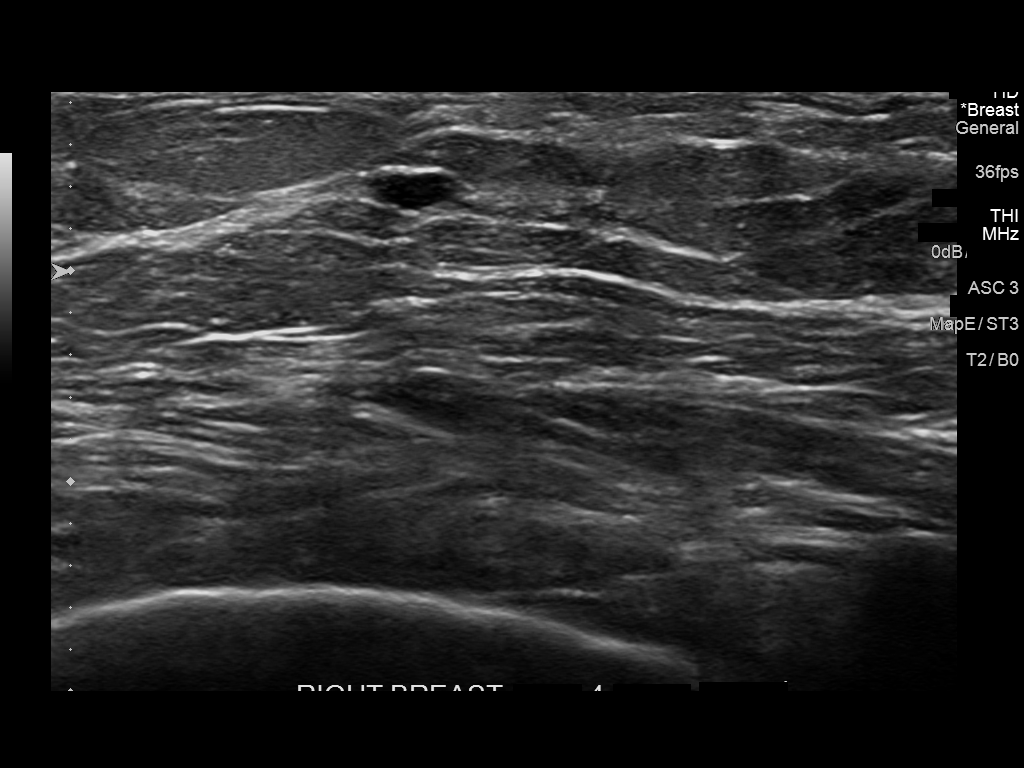
[im 5/6]
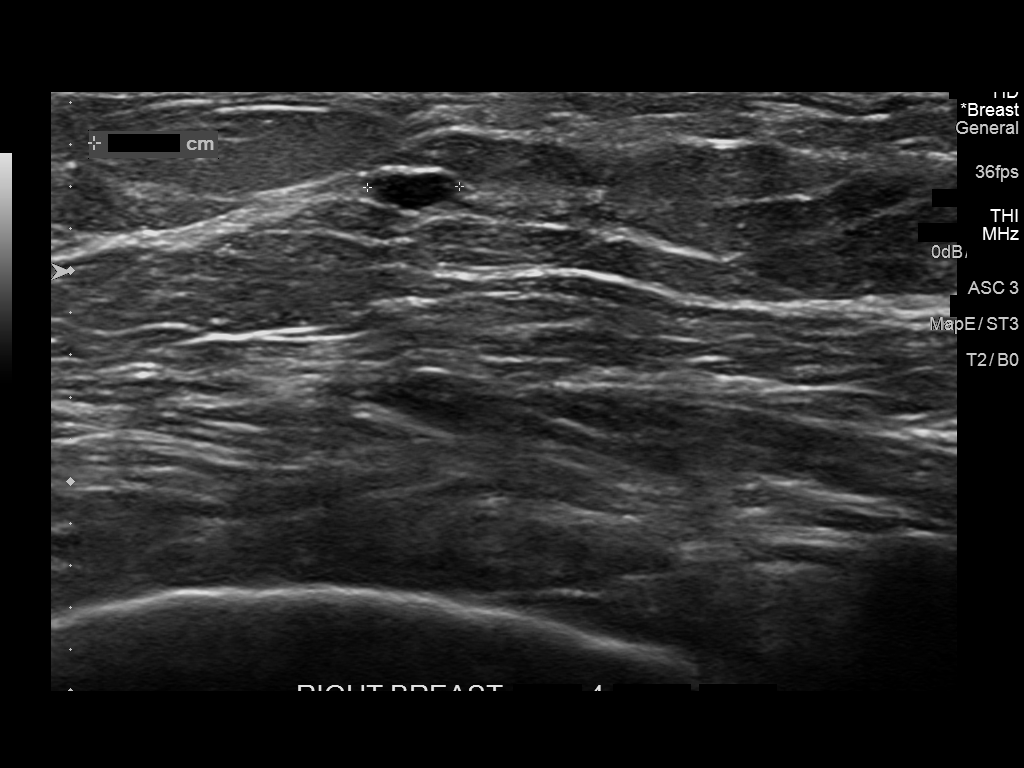
[im 6/6]
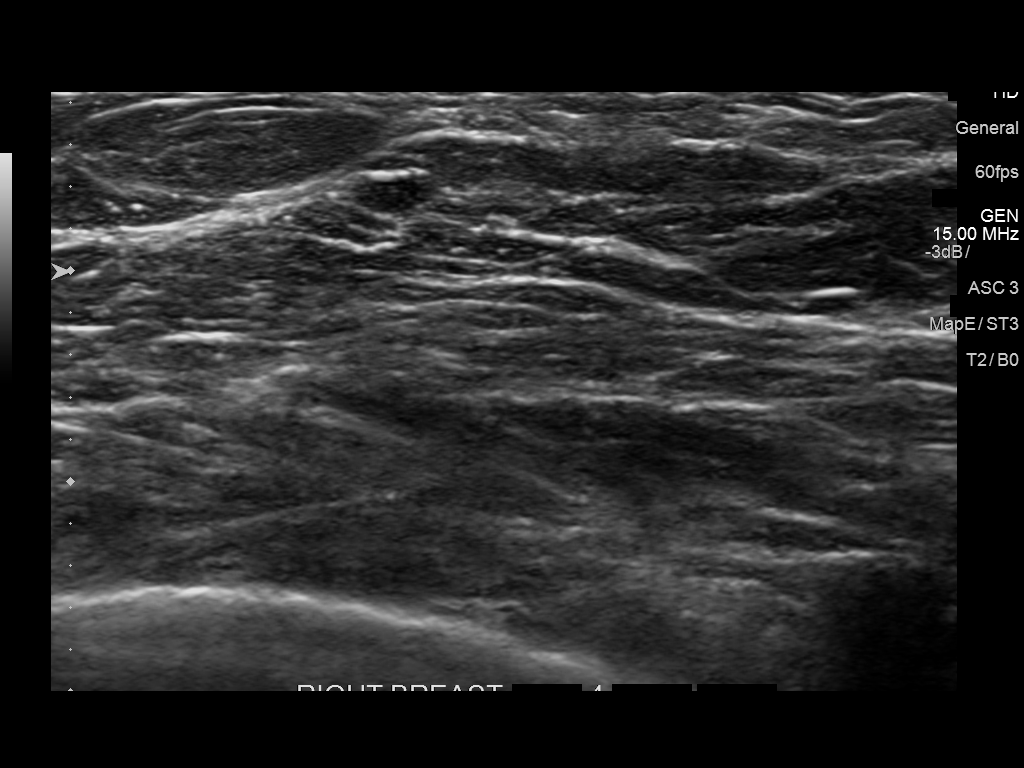

[6 of 6 positions shown; findings below may reference images not displayed]

ACR Breast Density Category b: There are scattered areas of
fibroglandular density.
FINDINGS: Exam demonstrates no change in oval 4-5 mm circumscribed mass over
the slightly outer upper right breast. Remainder of the exam is
unchanged.

Mammographic images were processed with CAD.

Targeted ultrasound is performed, showing an oval circumscribed hypo
to anechoic mass over the 12 o'clock position 4 cm from the nipple
measuring 2 x 4 x 5 mm likely a minimally complicated cyst, oil cyst
or focus of infarction metaplasia.
IMPRESSION: Probable benign mass over the 12 o'clock position of the right
breast 4 cm from the nipple measuring 2 x 4 x 5 mm.

RECOMMENDATION:
Recommend a six-month followup diagnostic right breast mammogram and
ultrasound at the time of patient's annual bilateral mammogram in
July 2016 to document continued stability.

I have discussed the findings and recommendations with the patient.
Results were also provided in writing at the conclusion of the
visit. If applicable, a reminder letter will be sent to the patient
regarding the next appointment.

BI-RADS CATEGORY  3: Probably benign.

## 2016-10-21 ENCOUNTER — Telehealth: Payer: Self-pay | Admitting: Family Medicine

## 2016-10-21 ENCOUNTER — Other Ambulatory Visit: Payer: Self-pay | Admitting: Family Medicine

## 2016-10-21 NOTE — Telephone Encounter (Signed)
I ordered this already. Why has she not received it? We may need to contact the rep.

## 2016-10-21 NOTE — Telephone Encounter (Signed)
Pt was called about needing her cologurard. Pt states she has not received it yet. I advised pt it was mailed out on 09/26/16 and did pt have her diabetes care? Please advise. Thank you!

## 2016-10-24 NOTE — Telephone Encounter (Signed)
Per DPR a voicemail was left letting pt know there is a system problem that is being addressed once rep from [employer] calls back.

## 2016-11-02 ENCOUNTER — Ambulatory Visit
Admission: RE | Admit: 2016-11-02 | Discharge: 2016-11-02 | Disposition: A | Discharge status: Home / Self Care | Payer: Medicare Other | Source: Ambulatory Visit | Attending: Family Medicine | Admitting: Family Medicine

## 2016-11-02 ENCOUNTER — Other Ambulatory Visit: Payer: Self-pay | Admitting: Family Medicine

## 2016-11-02 DIAGNOSIS — C9110 Chronic lymphocytic leukemia of B-cell type not having achieved remission: Secondary | ICD-10-CM | POA: Diagnosis not present

## 2016-11-02 DIAGNOSIS — N6002 Solitary cyst of left breast: Secondary | ICD-10-CM | POA: Insufficient documentation

## 2016-11-02 DIAGNOSIS — R590 Localized enlarged lymph nodes: Secondary | ICD-10-CM | POA: Insufficient documentation

## 2016-11-02 DIAGNOSIS — Z1239 Encounter for other screening for malignant neoplasm of breast: Secondary | ICD-10-CM

## 2016-11-02 DIAGNOSIS — Z1231 Encounter for screening mammogram for malignant neoplasm of breast: Secondary | ICD-10-CM | POA: Insufficient documentation

## 2016-11-16 NOTE — Progress Notes (Signed)
[place]  Telephone:([phone]) [phone] Fax:(336) [phone]  ID: Randel Pigg OB: [birth year]  MR#: [id number]  RSW#:[id number]  Patient Care Team: Coral Spikes, DO as PCP - General (Family Medicine) Coral Spikes, DO (Family Medicine) Minna Merritts, MD as Consulting Physician (Cardiology)  CHIEF COMPLAINT: CLL, Rail stage 0  INTERVAL HISTORY: Patient returns to clinic for repeat laboratory work and further evaluation. She is anxious but otherwise feels well. She has no neurologic complaints. She denies any fevers, night sweats, or weight loss. She has noted no lymphadenopathy. She has no chest pain or shortness of breath. She denies any nausea, vomiting, constipation, or diarrhea. She has no urinary complaints. Patient otherwise feels well and offers no further specific complaints.  REVIEW OF SYSTEMS:   Review of Systems  Constitutional: Negative.  Negative for fever, malaise/fatigue and weight loss.  Respiratory: Negative.  Negative for cough and shortness of breath.   Cardiovascular: Negative.  Negative for chest pain and leg swelling.  Gastrointestinal: Negative.  Negative for abdominal pain.  Genitourinary: Negative.   Musculoskeletal: Negative.   Neurological: Negative.  Negative for weakness.  Psychiatric/Behavioral: The patient is nervous/anxious.     As per HPI. Otherwise, a complete review of systems is negative.  PAST MEDICAL HISTORY: Past Medical History:  Diagnosis Date  . Anemia   . Arthritis   . Atrial fibrillation ([place])   . Chicken pox   . Diabetic neuropathy ([place])   . DM type 2 (diabetes mellitus, type 2) ([place])   . History of gastric ulcer   . Hypertension   . Peripheral vascular disease ([place])     PAST SURGICAL HISTORY: Past Surgical History:  Procedure Laterality Date  . ABDOMINAL HYSTERECTOMY    . ADENOIDECTOMY     5 yrs  . AMPUTATION Left 08/17/2015   Procedure: AMPUTATION BELOW KNEE;  Surgeon: Algernon Huxley, MD;  Location: [facility]  ORS;  Service: Vascular;  Laterality: Left;  . CARDIOVERSION    . CATARACT EXTRACTION    . CATARACT EXTRACTION W/PHACO Left 07/16/2015   Procedure: CATARACT EXTRACTION PHACO AND INTRAOCULAR LENS PLACEMENT (IOC);  Surgeon: Lyla Glassing, MD;  Location: [facility] ORS;  Service: Ophthalmology;  Laterality: Left;  [place]: 01:16.4   . ENDOVENOUS ABLATION SAPHENOUS VEIN W/ LASER    . FOOT SURGERY Right   . OOPHORECTOMY    . PERIPHERAL VASCULAR CATHETERIZATION Left 08/05/2015   Procedure: Lower Extremity Angiography;  Surgeon: Algernon Huxley, MD;  Location: [place] [facility];  Service: Cardiovascular;  Laterality: Left;  . PERIPHERAL VASCULAR CATHETERIZATION  08/05/2015   Procedure: Lower Extremity Intervention;  Surgeon: Algernon Huxley, MD;  Location: [place] [facility];  Service: Cardiovascular;;    FAMILY HISTORY: Family History  Problem Relation Age of Onset  . Arthritis Mother   . Hyperlipidemia Mother   . Hypertension Mother   . Breast cancer Mother 51  . Diabetes Mother   . COPD Mother   . Heart disease Mother   . Arthritis Father   . Hyperlipidemia Father   . Hypertension Father   . Diabetes Father   . Heart disease Father   . Arthritis Maternal Grandmother   . Stroke Paternal Grandmother   . Breast cancer Maternal Aunt     ADVANCED DIRECTIVES (Y/N):  N  HEALTH MAINTENANCE: Social History  Substance Use Topics  . Smoking status: Former Smoker    Packs/day: 1.00    Years: 40.00    Types: Cigarettes    Quit  date: 07/21/2009  . Smokeless tobacco: Never Used  . Alcohol use 0.6 oz/week    1 Glasses of wine per week     Comment: occas     Colonoscopy:  PAP:  Bone density:  Lipid panel:  Allergies  Allergen Reactions  . Codeine Nausea And Vomiting  . Tape Rash    Current Outpatient Prescriptions  Medication Sig Dispense Refill  . acetaminophen (TYLENOL) 500 MG tablet Take 500 mg by mouth every 6 (six) hours as needed.    Marland Kitchen aspirin EC 81 MG tablet Take 81 mg by mouth 2  (two) times daily.    Marland Kitchen atorvastatin (LIPITOR) 40 MG tablet Take 40 mg by mouth at bedtime.    . Biotin 2500 MCG CAPS Take 2,500 mcg by mouth.    . Calcium-Magnesium-Vitamin D (CALCIUM 500 PO) Take 500 mg by mouth 2 (two) times daily.    . Cholecalciferol (D3 ADULT PO) Take 1,000 Units by mouth once.    . diphenhydrAMINE (SOMINEX) 25 MG tablet Take 25 mg by mouth 2 (two) times daily.    Marland Kitchen gabapentin (NEURONTIN) 100 MG capsule TAKE 1 CAPSULE BY MOUTH THREE TIMES DAILY 90 capsule 1  . insulin NPH Human (HUMULIN N) 100 UNIT/ML injection Inject 0.06 mLs (6 Units total) into the skin 2 (two) times daily before a meal. 10 mL 11  . Omega-3 Fatty Acids (FISH OIL) 1200 MG CAPS Take 300 mg by mouth 2 (two) times daily.     Marland Kitchen pyridOXINE (VITAMIN B-6) 100 MG tablet Take 100 mg by mouth at bedtime.     . sotalol (BETAPACE) 120 MG tablet Take 1 tablet (120 mg total) by mouth 2 (two) times daily. 180 tablet 3  . vitamin B-12 (CYANOCOBALAMIN) 1000 MCG tablet Take 1,000 mcg by mouth 2 (two) times daily.    . vitamin E 400 UNIT capsule Take 400 Units by mouth 2 (two) times daily.     No current facility-administered medications for this visit.     OBJECTIVE: Vitals:   11/18/16 1046  BP: (!) 149/74  Pulse: (!) 56  Resp: 18  Temp: 98.1 F (36.7 C)     Body mass index is 29.13 kg/m.    ECOG FS:0 - Asymptomatic  General: Well-developed, well-nourished, no acute distress. Eyes: Pink conjunctiva, anicteric sclera. Lungs: Clear to auscultation bilaterally. Heart: Regular rate and rhythm. No rubs, murmurs, or gallops. Abdomen: Soft, nontender, nondistended. No organomegaly noted, normoactive bowel sounds. Musculoskeletal: No edema, cyanosis, or clubbing. Neuro: Alert, answering all questions appropriately. Cranial nerves grossly intact. Skin: No rashes or petechiae noted. Psych: Normal affect.  LAB RESULTS:  Lab Results  Component Value Date   NA 136 08/12/2016   K 5.1 08/12/2016   CL 101  08/12/2016   CO2 30 08/12/2016   GLUCOSE 193 (H) 08/12/2016   BUN 22 08/12/2016   CREATININE 0.80 08/12/2016   CALCIUM 9.5 08/12/2016   PROT 7.4 08/12/2016   ALBUMIN 4.1 08/12/2016   AST 17 08/12/2016   ALT 15 08/12/2016   ALKPHOS 57 08/12/2016   BILITOT 0.6 08/12/2016   GFRNONAA 59 (L) 08/18/2015   GFRAA >60 08/18/2015    Lab Results  Component Value Date   WBC 30.7 (H) 11/18/2016   NEUTROABS 8.2 (H) 11/18/2016   HGB 12.9 11/18/2016   HCT 40.5 11/18/2016   MCV 79.7 (L) 11/18/2016   PLT 212 11/18/2016     STUDIES: US Breast Ltd Uni Left Inc Axilla  Result Date: 11/03/2016 CLINICAL DATA:  65 year old female seen for follow-up of a probably benign right breast finding EXAM: 2D DIGITAL DIAGNOSTIC BILATERAL MAMMOGRAM WITH CAD AND ADJUNCT TOMO ULTRASOUND BILATERAL BREAST COMPARISON:  Previous exam(s). ACR Breast Density Category b: There are scattered areas of fibroglandular density. FINDINGS: The previously noted oval, circumscribed mass within the superior right breast is not significantly changed from prior exam. Two adjacent oval, circumscribed masses within the lateral right breast have increased in size from prior exam. On tomosynthesis imaging, these lymph nodes demonstrate the suggestion of a central fatty hilum. In addition, there are prominent bilateral axillary lymph nodes. Mammographic images were processed with CAD. Targeted ultrasound is performed, showing an oval, circumscribed, hypoechoic mass measuring 5 x 2 x 4 mm at 12 o'clock, 4 cm from the nipple with increased through transmission, likely representing a small complicated cyst. At 8 o'clock, 5 cm from the nipple, there are 2 adjacent oval, circumscribed, hypoechoic masses measuring 4 x 3 x 4 mm and 4 x 3 x 4 mm. There is central vascularity within both masses which are thought to represent intramammary lymph nodes. Ultrasound of the bilateral axilla demonstrates several prominent axillary lymph nodes with cortical  thickening measuring 4-5 mm. IMPRESSION: 1. Probable left breast cyst at 12 o'clock, 4 cm from the nipple. 2. Enlarged right intramammary lymph node and bilateral axillary lymphadenopathy. Findings are likely related to the patient's known chronic lymphocytic leukemia. Findings were discussed with the patient's oncologist, Dr. Grayland Ormond, via telephone on 11/03/2016. RECOMMENDATION: 1. Bilateral diagnostic mammogram and possible ultrasound in 6 months. 2. The patient reports a follow-up appointment with Dr. Grayland Ormond in several weeks for her CLL. I have discussed the findings and recommendations with the patient. Results were also provided in writing at the conclusion of the visit. If applicable, a reminder letter will be sent to the patient regarding the next appointment. BI-RADS CATEGORY  3: Probably benign. Electronically Signed   By: Pamelia Hoit M.D.   On: 11/03/2016 13:49   US Breast Ltd Uni Right Inc Axilla  Result Date: 11/03/2016 CLINICAL DATA:  65 year old female seen for follow-up of a probably benign right breast finding EXAM: 2D DIGITAL DIAGNOSTIC BILATERAL MAMMOGRAM WITH CAD AND ADJUNCT TOMO ULTRASOUND BILATERAL BREAST COMPARISON:  Previous exam(s). ACR Breast Density Category b: There are scattered areas of fibroglandular density. FINDINGS: The previously noted oval, circumscribed mass within the superior right breast is not significantly changed from prior exam. Two adjacent oval, circumscribed masses within the lateral right breast have increased in size from prior exam. On tomosynthesis imaging, these lymph nodes demonstrate the suggestion of a central fatty hilum. In addition, there are prominent bilateral axillary lymph nodes. Mammographic images were processed with CAD. Targeted ultrasound is performed, showing an oval, circumscribed, hypoechoic mass measuring 5 x 2 x 4 mm at 12 o'clock, 4 cm from the nipple with increased through transmission, likely representing a small complicated cyst. At 8  o'clock, 5 cm from the nipple, there are 2 adjacent oval, circumscribed, hypoechoic masses measuring 4 x 3 x 4 mm and 4 x 3 x 4 mm. There is central vascularity within both masses which are thought to represent intramammary lymph nodes. Ultrasound of the bilateral axilla demonstrates several prominent axillary lymph nodes with cortical thickening measuring 4-5 mm. IMPRESSION: 1. Probable left breast cyst at 12 o'clock, 4 cm from the nipple. 2. Enlarged right intramammary lymph node and bilateral axillary lymphadenopathy. Findings are likely related to the patient's known chronic lymphocytic leukemia. Findings were discussed with the patient's oncologist,  Dr. Grayland Ormond, via telephone on 11/03/2016. RECOMMENDATION: 1. Bilateral diagnostic mammogram and possible ultrasound in 6 months. 2. The patient reports a follow-up appointment with Dr. Grayland Ormond in several weeks for her CLL. I have discussed the findings and recommendations with the patient. Results were also provided in writing at the conclusion of the visit. If applicable, a reminder letter will be sent to the patient regarding the next appointment. BI-RADS CATEGORY  3: Probably benign. Electronically Signed   By: Pamelia Hoit M.D.   On: 11/03/2016 13:49   Mm Diag Breast Tomo Bilateral  Result Date: 11/03/2016 CLINICAL DATA:  65 year old female seen for follow-up of a probably benign right breast finding EXAM: 2D DIGITAL DIAGNOSTIC BILATERAL MAMMOGRAM WITH CAD AND ADJUNCT TOMO ULTRASOUND BILATERAL BREAST COMPARISON:  Previous exam(s). ACR Breast Density Category b: There are scattered areas of fibroglandular density. FINDINGS: The previously noted oval, circumscribed mass within the superior right breast is not significantly changed from prior exam. Two adjacent oval, circumscribed masses within the lateral right breast have increased in size from prior exam. On tomosynthesis imaging, these lymph nodes demonstrate the suggestion of a central fatty hilum. In  addition, there are prominent bilateral axillary lymph nodes. Mammographic images were processed with CAD. Targeted ultrasound is performed, showing an oval, circumscribed, hypoechoic mass measuring 5 x 2 x 4 mm at 12 o'clock, 4 cm from the nipple with increased through transmission, likely representing a small complicated cyst. At 8 o'clock, 5 cm from the nipple, there are 2 adjacent oval, circumscribed, hypoechoic masses measuring 4 x 3 x 4 mm and 4 x 3 x 4 mm. There is central vascularity within both masses which are thought to represent intramammary lymph nodes. Ultrasound of the bilateral axilla demonstrates several prominent axillary lymph nodes with cortical thickening measuring 4-5 mm. IMPRESSION: 1. Probable left breast cyst at 12 o'clock, 4 cm from the nipple. 2. Enlarged right intramammary lymph node and bilateral axillary lymphadenopathy. Findings are likely related to the patient's known chronic lymphocytic leukemia. Findings were discussed with the patient's oncologist, Dr. Grayland Ormond, via telephone on 11/03/2016. RECOMMENDATION: 1. Bilateral diagnostic mammogram and possible ultrasound in 6 months. 2. The patient reports a follow-up appointment with Dr. Grayland Ormond in several weeks for her CLL. I have discussed the findings and recommendations with the patient. Results were also provided in writing at the conclusion of the visit. If applicable, a reminder letter will be sent to the patient regarding the next appointment. BI-RADS CATEGORY  3: Probably benign. Electronically Signed   By: Pamelia Hoit M.D.   On: 11/03/2016 13:49    ASSESSMENT: CLL, Rai stage 0  PLAN:    1. CLL, Rai stage 0:  Diagnosis confirmed by flow cytometry. Patient does not require bone marrow biopsy. Will consider CT of the neck, chest, abdomen, pelvis to complete the staging process in the future if there is concern for progression of disease. No intervention is needed at this time. Patient will return to clinic in 3 months with  repeat laboratory work and then in 6 months for laboratory work and further evaluation. 2. Hypertension: Likely secondary to patient's increased anxiety. Continue monitoring and treatment per primary care.  Approximately 30 minutes was spent in discussion of which greater than 50% was consultation.  Patient expressed understanding and was in agreement with this plan. She also understands that She can call clinic at any time with any questions, concerns, or complaints.   Lloyd Huger, MD   11/20/2016 7:09 PM

## 2016-11-18 ENCOUNTER — Inpatient Hospital Stay (HOSPITAL_BASED_OUTPATIENT_CLINIC_OR_DEPARTMENT_OTHER): Payer: Medicare Other | Admitting: Oncology

## 2016-11-18 ENCOUNTER — Inpatient Hospital Stay: Payer: Medicare Other | Attending: Oncology

## 2016-11-18 VITALS — BP 149/74 | HR 56 | Temp 98.1°F | Resp 18 | Wt 164.5 lb

## 2016-11-18 DIAGNOSIS — F419 Anxiety disorder, unspecified: Secondary | ICD-10-CM

## 2016-11-18 DIAGNOSIS — Z79899 Other long term (current) drug therapy: Secondary | ICD-10-CM | POA: Insufficient documentation

## 2016-11-18 DIAGNOSIS — Z89512 Acquired absence of left leg below knee: Secondary | ICD-10-CM | POA: Insufficient documentation

## 2016-11-18 DIAGNOSIS — Z7982 Long term (current) use of aspirin: Secondary | ICD-10-CM | POA: Insufficient documentation

## 2016-11-18 DIAGNOSIS — I10 Essential (primary) hypertension: Secondary | ICD-10-CM | POA: Insufficient documentation

## 2016-11-18 DIAGNOSIS — C9110 Chronic lymphocytic leukemia of B-cell type not having achieved remission: Secondary | ICD-10-CM

## 2016-11-18 DIAGNOSIS — I4891 Unspecified atrial fibrillation: Secondary | ICD-10-CM | POA: Insufficient documentation

## 2016-11-18 DIAGNOSIS — E1140 Type 2 diabetes mellitus with diabetic neuropathy, unspecified: Secondary | ICD-10-CM | POA: Insufficient documentation

## 2016-11-18 DIAGNOSIS — Z87891 Personal history of nicotine dependence: Secondary | ICD-10-CM | POA: Diagnosis not present

## 2016-11-18 LAB — CBC WITH DIFFERENTIAL/PLATELET
BASOS PCT: 1 %
Basophils Absolute: 0.2 10*3/uL — ABNORMAL HIGH (ref 0–0.1)
EOS ABS: 0.3 10*3/uL (ref 0–0.7)
EOS PCT: 1 %
HCT: 40.5 % (ref 35.0–47.0)
Hemoglobin: 12.9 g/dL (ref 12.0–16.0)
LYMPHS ABS: 20.9 10*3/uL — AB (ref 1.0–3.6)
Lymphocytes Relative: 68 %
MCH: 25.4 pg — AB (ref 26.0–34.0)
MCHC: 31.8 g/dL — ABNORMAL LOW (ref 32.0–36.0)
MCV: 79.7 fL — ABNORMAL LOW (ref 80.0–100.0)
Monocytes Absolute: 1 10*3/uL — ABNORMAL HIGH (ref 0.2–0.9)
Monocytes Relative: 3 %
Neutro Abs: 8.2 10*3/uL — ABNORMAL HIGH (ref 1.4–6.5)
Neutrophils Relative %: 27 %
PLATELETS: 212 10*3/uL (ref 150–440)
RBC: 5.08 MIL/uL (ref 3.80–5.20)
RDW: 14.3 % (ref 11.5–14.5)
WBC: 30.7 10*3/uL — AB (ref 3.6–11.0)

## 2016-11-18 NOTE — Progress Notes (Signed)
Offers no complaints  

## 2016-12-28 IMAGING — US US BREAST*L* LIMITED INC AXILLA
2 series · 6 of 6 positions shown · non-contrast
Comparison: Previous exam(s).

CLINICAL DATA: 65-year-old female seen for follow-up of a probably
benign right breast finding

EXAM:
2D DIGITAL DIAGNOSTIC BILATERAL MAMMOGRAM WITH CAD AND ADJUNCT TOMO
ULTRASOUND BILATERAL BREAST

[Series 1: us breast*left* limited inc axilla · 0.08mm/px · 4 of 4 slices shown (1 of 2)]
[im 1/4]
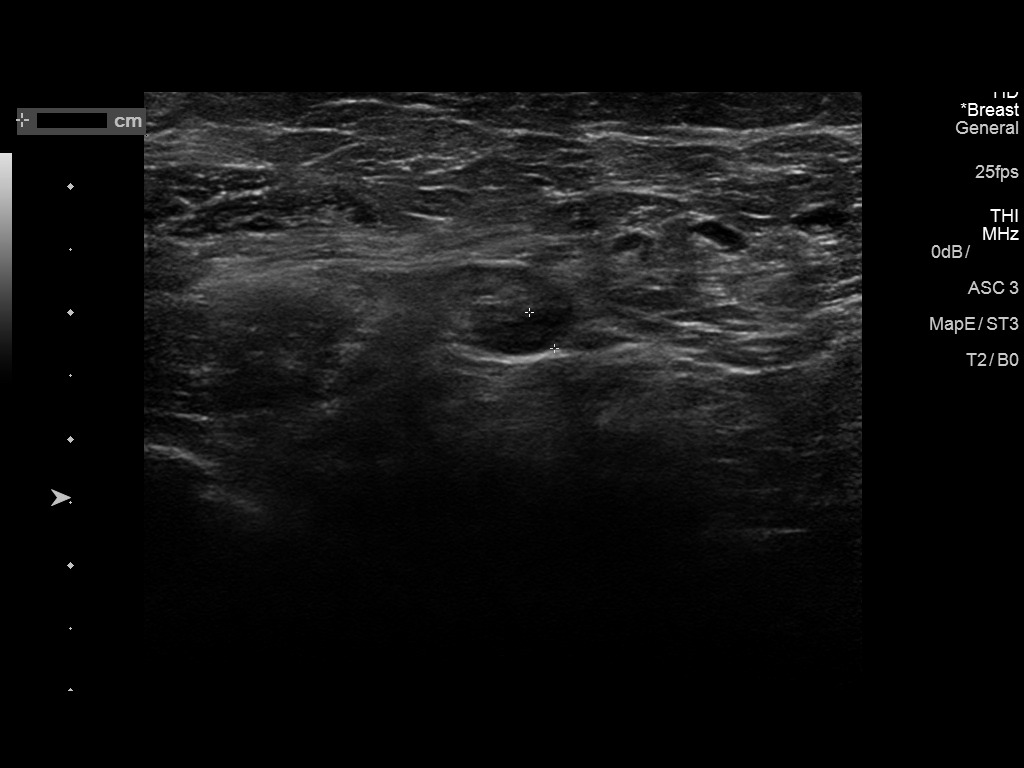
[im 2/4]
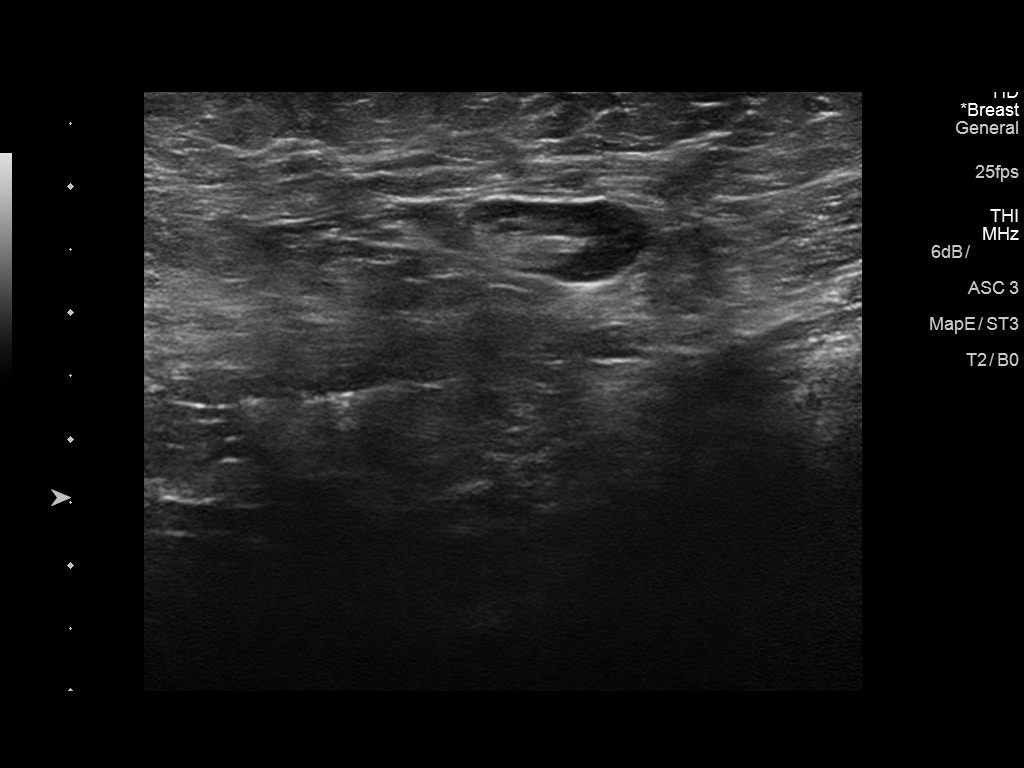
[im 3/4]
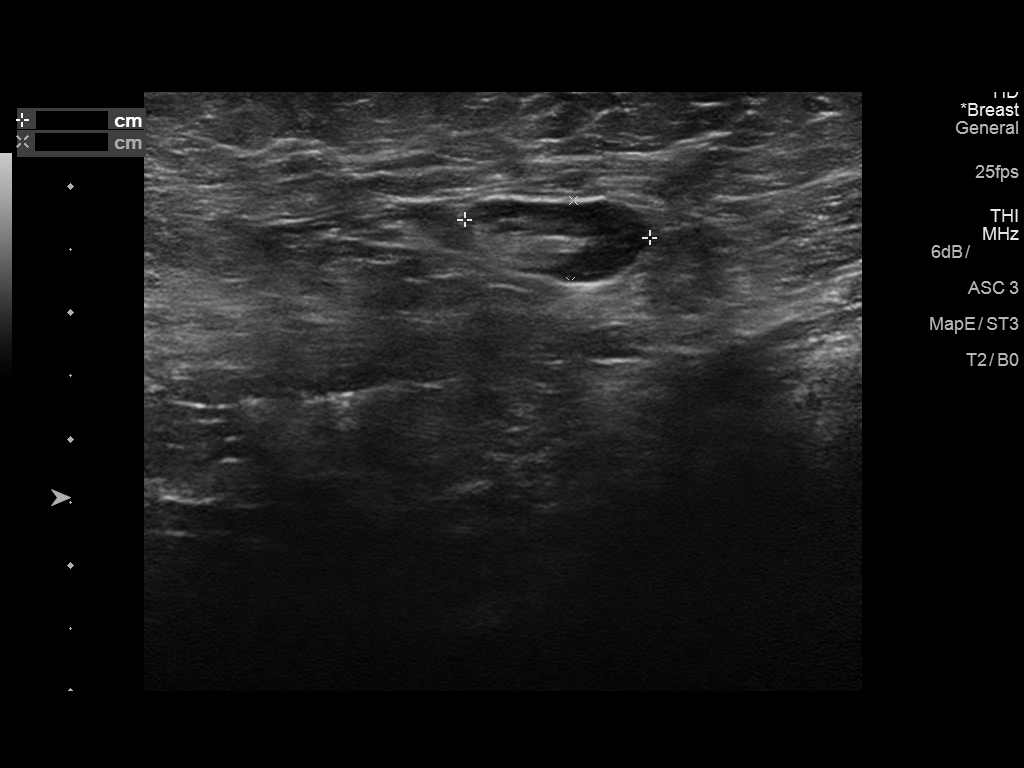
[im 4/4]
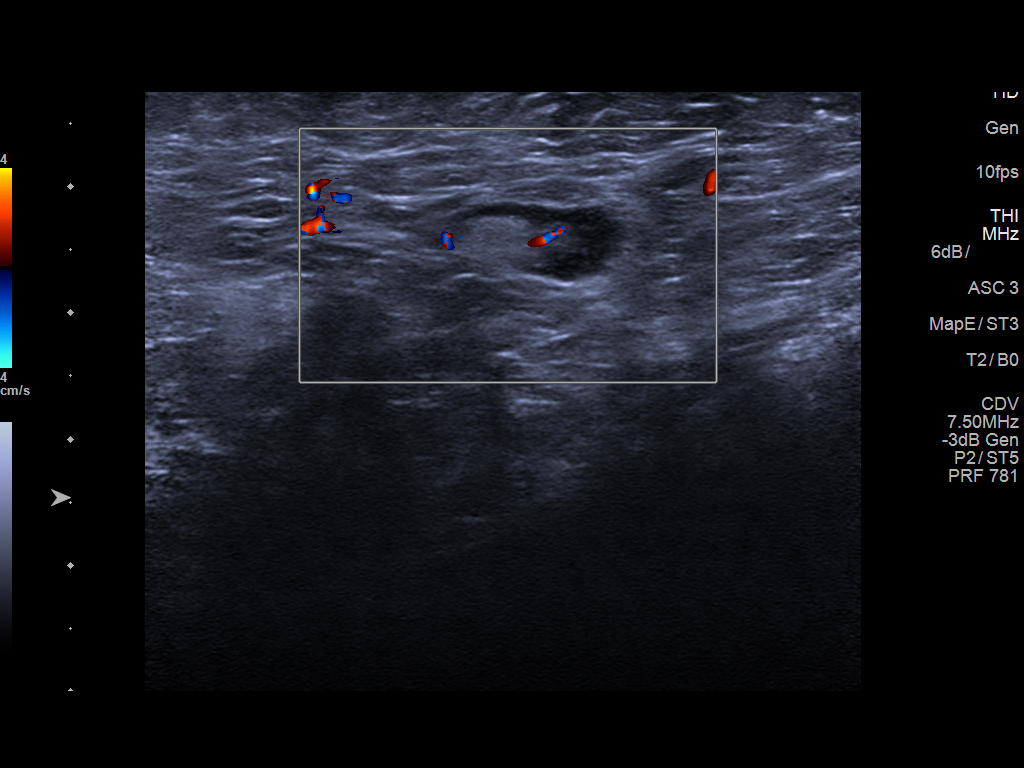

[Series 2: us breast*left* limited inc axilla · 0.07mm/px · 2 of 2 slices shown (2 of 2)]
[im 1/2]
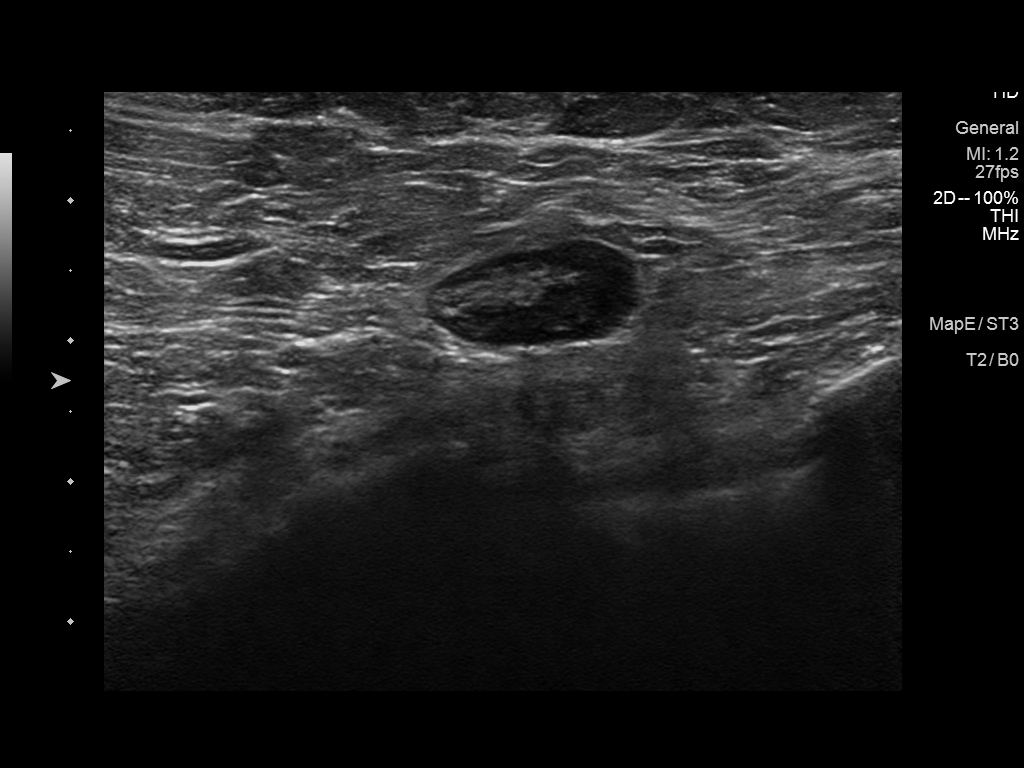
[im 2/2]
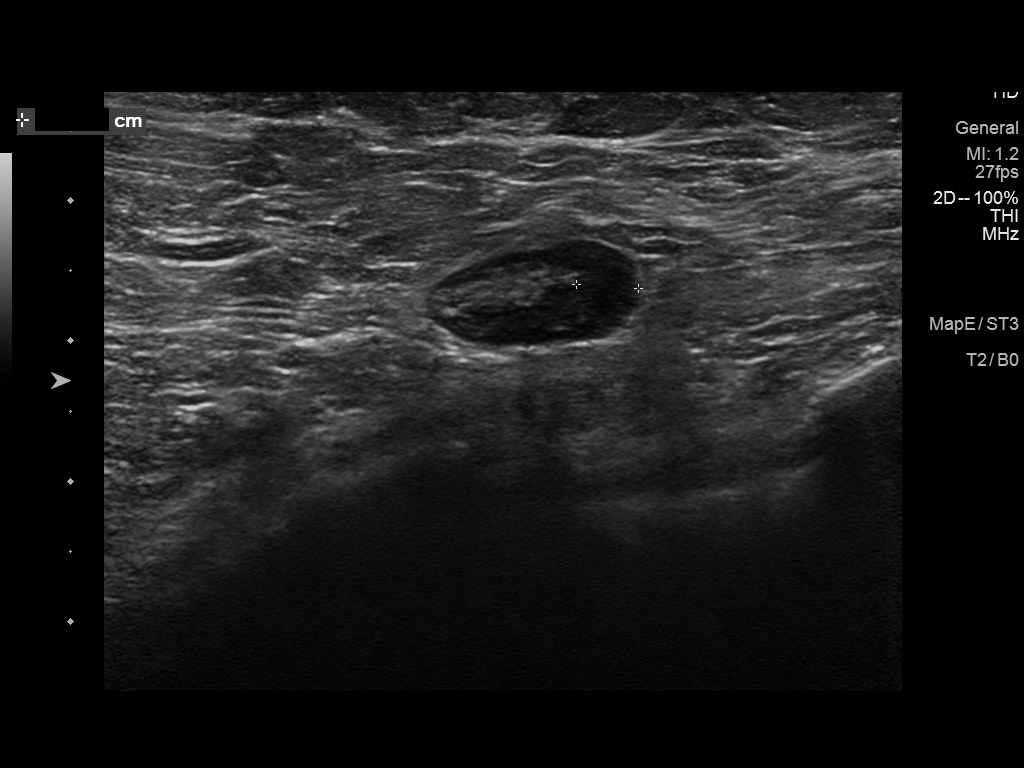

[6 of 6 positions shown; findings below may reference images not displayed]

ACR Breast Density Category b: There are scattered areas of
fibroglandular density.
FINDINGS: The previously noted oval, circumscribed mass within the superior
right breast is not significantly changed from prior exam. Two
adjacent oval, circumscribed masses within the lateral right breast
have increased in size from prior exam. On tomosynthesis imaging,
these lymph nodes demonstrate the suggestion of a central fatty
hilum. In addition, there are prominent bilateral axillary lymph
nodes.

Mammographic images were processed with CAD.

Targeted ultrasound is performed, showing an oval, circumscribed,
hypoechoic mass measuring 5 x 2 x 4 mm at 12 o'clock, 4 cm from the
nipple with increased through transmission, likely representing a
small complicated cyst. At 8 o'clock, 5 cm from the nipple, there
are 2 adjacent oval, circumscribed, hypoechoic masses measuring 4 x
3 x 4 mm and 4 x 3 x 4 mm. There is central vascularity within both
masses which are thought to represent intramammary lymph nodes.
Ultrasound of the bilateral axilla demonstrates several prominent
axillary lymph nodes with cortical thickening measuring 4-5 mm.
IMPRESSION: 1. Probable left breast cyst at 12 o'clock, 4 cm from the nipple.
2. Enlarged right intramammary lymph node and bilateral axillary
lymphadenopathy. Findings are likely related to the patient's known
chronic lymphocytic leukemia. Findings were discussed with the
patient's oncologist, Dr. Bedja, via telephone on 11/03/2016.

RECOMMENDATION:
1. Bilateral diagnostic mammogram and possible ultrasound in 6
months.
2. The patient reports a follow-up appointment with Dr. Bedja in
several weeks for her CLL.

I have discussed the findings and recommendations with the patient.
Results were also provided in writing at the conclusion of the
visit. If applicable, a reminder letter will be sent to the patient
regarding the next appointment.

BI-RADS CATEGORY  3: Probably benign.

## 2017-02-03 ENCOUNTER — Ambulatory Visit (INDEPENDENT_AMBULATORY_CARE_PROVIDER_SITE_OTHER): Payer: Medicare Other | Admitting: Cardiovascular Disease

## 2017-02-03 ENCOUNTER — Encounter: Payer: Self-pay | Admitting: Cardiovascular Disease

## 2017-02-03 VITALS — BP 140/60 | HR 70 | Ht 63.0 in | Wt 165.5 lb

## 2017-02-03 DIAGNOSIS — Z87891 Personal history of nicotine dependence: Secondary | ICD-10-CM

## 2017-02-03 DIAGNOSIS — C9110 Chronic lymphocytic leukemia of B-cell type not having achieved remission: Secondary | ICD-10-CM

## 2017-02-03 DIAGNOSIS — I10 Essential (primary) hypertension: Secondary | ICD-10-CM

## 2017-02-03 DIAGNOSIS — I739 Peripheral vascular disease, unspecified: Secondary | ICD-10-CM | POA: Diagnosis not present

## 2017-02-03 DIAGNOSIS — E785 Hyperlipidemia, unspecified: Secondary | ICD-10-CM

## 2017-02-03 DIAGNOSIS — E1149 Type 2 diabetes mellitus with other diabetic neurological complication: Secondary | ICD-10-CM | POA: Diagnosis not present

## 2017-02-03 DIAGNOSIS — I480 Paroxysmal atrial fibrillation: Secondary | ICD-10-CM

## 2017-02-03 MED ORDER — FUROSEMIDE 20 MG PO TABS: 20.0000 mg | ORAL_TABLET | Freq: Every day | ORAL | 3 refills | Status: AC | PRN

## 2017-02-03 NOTE — Patient Instructions (Addendum)
Medication Instructions:   Please take lasix as needed for leg swelling  Labwork:  No new labs needed  Testing/Procedures:  No further testing at this time   I recommend watching educational videos on topics of interest to you at:       [email or web address]  Enter code: [email or web address]    Follow-Up: It was a pleasure seeing you in the office today. Please call us if you have new issues that need to be addressed before your next appt.  [phone]  Your physician wants you to follow-up in: 6 months.  You will receive a reminder letter in the mail two months in advance. If you don't receive a letter, please call our office to schedule the follow-up appointment.  If you need a refill on your cardiac medications before your next appointment, please call your pharmacy.

## 2017-02-03 NOTE — Progress Notes (Signed)
Cardiology Office Note  Date:  02/03/2017   ID:  ISYS MECKES, DOB [birth year], MRN [id number]  PCP:  Coral Spikes, DO   Chief Complaint  Patient presents with  . other    6 month follow up. Meds reviewed by the pt.'s med list. Pt. just getting over the flu from about 1 week ago.     HPI:  [name] is a pleasant 66 yo woman with long history of poorly controlled diabetes, complications including amputation , prior history of smoking who stopped in 11-Mar-2009, hyperlipidemia, Previous history of  Atrial fibrillation and cardioversion November 2010,  Who presents for routine follow-up of her hyperlipidemia and atrial fibrillation  In follow-up today she reports that she was recently on a cruise January 2018 Poor diet, weight up 15 pounds Suspect her diabetes numbers have run up After she got back she got the flu was sick for over a week  Lab work from 6 months ago reviewed with her in detail HBA1C 7.4, up from the 6 range Total chol 138, LDL 73  Has CLL, WBC 27 Managed by oncology  No regular exercise program  prosthesis for her left leg,  Walks with a walker  No palpitations concerning for atrial fibrillation She does not want anticoagulation does not monitor heart rate at home,   EKG shows NSR rate 70 bpm, no significant ST or T wave changes  Other past medical history reviewed  Diabetes numbers dramatically improved, hemoglobin A1c  12   significant stress in 03/11/2009, mother died, father had significant medical issues. She feels that the stress of the situation contributed to episode of atrial fibrillation.  She was placed in the hospital for several days, had cardioversion.   She does not feel that she has had any further episodes of atrial fibrillation since that time but she is not certain.    She was treated for warfarin the past for her atrial fibrillation, did not feel well on warfarin and this was stopped years ago.   She prefers not to start anticoagulation      PMH:   has a past medical history of Anemia; Arthritis; Atrial fibrillation ([place]); Chicken pox; Diabetic neuropathy ([place]); DM type 2 (diabetes mellitus, type 2) ([place]); History of gastric ulcer; Hypertension; and Peripheral vascular disease ([place]).  PSH:    Past Surgical History:  Procedure Laterality Date  . ABDOMINAL HYSTERECTOMY    . ADENOIDECTOMY     5 yrs  . AMPUTATION Left 08/17/2015   Procedure: AMPUTATION BELOW KNEE;  Surgeon: Algernon Huxley, MD;  Location: [facility];  Service: Vascular;  Laterality: Left;  . CARDIOVERSION    . CATARACT EXTRACTION    . CATARACT EXTRACTION W/PHACO Left 07/16/2015   Procedure: CATARACT EXTRACTION PHACO AND INTRAOCULAR LENS PLACEMENT (IOC);  Surgeon: Lyla Glassing, MD;  Location: [facility];  Service: Ophthalmology;  Laterality: Left;  [place]: 01:16.4   . ENDOVENOUS ABLATION SAPHENOUS VEIN W/ LASER    . FOOT SURGERY Right   . OOPHORECTOMY    . PERIPHERAL VASCULAR CATHETERIZATION Left 08/05/2015   Procedure: Lower Extremity Angiography;  Surgeon: Algernon Huxley, MD;  Location: [place] [facility];  Service: Cardiovascular;  Laterality: Left;  . PERIPHERAL VASCULAR CATHETERIZATION  08/05/2015   Procedure: Lower Extremity Intervention;  Surgeon: Algernon Huxley, MD;  Location: [place] [facility];  Service: Cardiovascular;;    Current Outpatient Prescriptions  Medication Sig Dispense Refill  . acetaminophen (TYLENOL) 500 MG tablet Take 500 mg by mouth  every 6 (six) hours as needed.    Marland Kitchen aspirin EC 81 MG tablet Take 81 mg by mouth 2 (two) times daily.    Marland Kitchen atorvastatin (LIPITOR) 40 MG tablet Take 40 mg by mouth at bedtime.    . Biotin 2500 MCG CAPS Take 2,500 mcg by mouth.    . Calcium-Magnesium-Vitamin D (CALCIUM 500 PO) Take 500 mg by mouth 2 (two) times daily.    . Cholecalciferol (D3 ADULT PO) Take 1,000 Units by mouth once.    . diphenhydrAMINE (SOMINEX) 25 MG tablet Take 25 mg by mouth 2 (two) times daily.    Marland Kitchen gabapentin (NEURONTIN) 100 MG capsule  TAKE 1 CAPSULE BY MOUTH THREE TIMES DAILY 90 capsule 1  . insulin NPH Human (HUMULIN N) 100 UNIT/ML injection Inject 0.06 mLs (6 Units total) into the skin 2 (two) times daily before a meal. 10 mL 11  . Omega-3 Fatty Acids (FISH OIL) 1200 MG CAPS Take 300 mg by mouth 2 (two) times daily.     Marland Kitchen pyridOXINE (VITAMIN B-6) 100 MG tablet Take 100 mg by mouth at bedtime.     . sotalol (BETAPACE) 120 MG tablet Take 1 tablet (120 mg total) by mouth 2 (two) times daily. 180 tablet 3  . vitamin B-12 (CYANOCOBALAMIN) 1000 MCG tablet Take 1,000 mcg by mouth 2 (two) times daily.    . vitamin E 400 UNIT capsule Take 400 Units by mouth 2 (two) times daily.    . furosemide (LASIX) 20 MG tablet Take 1 tablet (20 mg total) by mouth daily as needed. 30 tablet 3   No current facility-administered medications for this visit.      Allergies:   Codeine and Tape   Social History:  The patient  reports that she quit smoking about 7 years ago. Her smoking use included Cigarettes. She has a 40.00 pack-year smoking history. She has never used smokeless tobacco. She reports that she drinks about 0.6 oz of alcohol per week . She reports that she does not use drugs.   Family History:   family history includes Arthritis in her father, maternal grandmother, and mother; Breast cancer in her maternal aunt; Breast cancer (age of onset: 29) in her mother; COPD in her mother; Diabetes in her father and mother; Heart disease in her father and mother; Hyperlipidemia in her father and mother; Hypertension in her father and mother; Stroke in her paternal grandmother.    Review of Systems: Review of Systems  Constitutional: Negative.        Weight gain  Respiratory: Negative.   Cardiovascular: Negative.   Gastrointestinal: Negative.   Musculoskeletal: Negative.        Gait instability  Neurological: Negative.   Psychiatric/Behavioral: Negative.   All other systems reviewed and are negative.    PHYSICAL EXAM: VS:  BP 140/60  (BP Location: Left Arm, Patient Position: Sitting, Cuff Size: Normal)   Pulse 70   Ht 5\' 3"  (1.6 m)   Wt 165 lb 8 oz (75.1 kg)   BMI 29.32 kg/m  , BMI Body mass index is 29.32 kg/m. GEN: Well nourished, well developed, in no acute distress  HEENT: normal  Neck: no JVD, carotid bruits, or masses Cardiac: RRR; no murmurs, rubs, or gallops,no edema  Respiratory:  clear to auscultation bilaterally, normal work of breathing GI: soft, nontender, nondistended, + BS MS: no deformity or atrophy ,  leg prosthesis  Skin: warm and dry, no rash Neuro:  Strength and sensation are intact Psych: euthymic  mood, full affect    Recent Labs: 08/12/2016: ALT 15; BUN 22; Creatinine, Ser 0.80; Potassium 5.1; Sodium 136 11/18/2016: Hemoglobin 12.9; Platelets 212    Lipid Panel Lab Results  Component Value Date   CHOL 138 08/12/2016   HDL 35.00 (L) 08/12/2016   LDLCALC 73 08/12/2016   TRIG 154.0 (H) 08/12/2016      Wt Readings from Last 3 Encounters:  02/03/17 165 lb 8 oz (75.1 kg)  11/18/16 164 lb 7.4 oz (74.6 kg)  09/06/16 152 lb (68.9 kg)       ASSESSMENT AND PLAN:  Paroxysmal atrial fibrillation (HCC) - Plan: EKG 12-Lead Long discussion with her she does not feel she is having arrhythmia, does not want anticoagulation Recommended she monitor her heart rate at home with her wrist blood pressure cuff  Hyperlipidemia, unspecified hyperlipidemia type - Plan: EKG 12-Lead Cholesterol is at goal on the current lipid regimen. No changes to the medications were made.  Essential hypertension - Plan: EKG 12-Lead Blood pressure borderline elevated, recommended she monitor her numbers Work on her weight  Peripheral vascular disease ([place]) - Plan: EKG 12-Lead Followed by Dr. Lucky Cowboy  DM (diabetes mellitus), type 2 with neurological complications ([place]) Poor diet, A1c likely trending upwards Recommended low carbohydrate, decrease her bread intake  CLL (chronic lymphocytic leukemia) ([place]) Followed  by oncology, currently on no therapy  Former smoker   Total encounter time more than 25 minutes  Greater than 50% was spent in counseling and coordination of care with the patient   Disposition:   F/U  6 months   Orders Placed This Encounter  Procedures  . EKG 12-Lead     Signed, Esmond Plants, M.D., Ph.D. 02/03/2017  [place], [place]

## 2017-02-04 ENCOUNTER — Other Ambulatory Visit: Payer: Self-pay | Admitting: Family Medicine

## 2017-02-06 NOTE — Telephone Encounter (Signed)
Please advise refills, gabapentin was last filled on 08/11/2016 and the lisinopril was prior to April and not on med list anymore. thanks

## 2017-02-17 ENCOUNTER — Inpatient Hospital Stay: Payer: Medicare Other | Attending: Oncology

## 2017-02-17 DIAGNOSIS — I4891 Unspecified atrial fibrillation: Secondary | ICD-10-CM | POA: Diagnosis not present

## 2017-02-17 DIAGNOSIS — Z79899 Other long term (current) drug therapy: Secondary | ICD-10-CM | POA: Insufficient documentation

## 2017-02-17 DIAGNOSIS — Z89512 Acquired absence of left leg below knee: Secondary | ICD-10-CM | POA: Insufficient documentation

## 2017-02-17 DIAGNOSIS — I10 Essential (primary) hypertension: Secondary | ICD-10-CM | POA: Insufficient documentation

## 2017-02-17 DIAGNOSIS — Z7982 Long term (current) use of aspirin: Secondary | ICD-10-CM | POA: Insufficient documentation

## 2017-02-17 DIAGNOSIS — E1140 Type 2 diabetes mellitus with diabetic neuropathy, unspecified: Secondary | ICD-10-CM | POA: Diagnosis not present

## 2017-02-17 DIAGNOSIS — Z87891 Personal history of nicotine dependence: Secondary | ICD-10-CM | POA: Insufficient documentation

## 2017-02-17 DIAGNOSIS — F419 Anxiety disorder, unspecified: Secondary | ICD-10-CM | POA: Insufficient documentation

## 2017-02-17 DIAGNOSIS — C9110 Chronic lymphocytic leukemia of B-cell type not having achieved remission: Secondary | ICD-10-CM | POA: Diagnosis not present

## 2017-02-17 LAB — CBC WITH DIFFERENTIAL/PLATELET
BASOS ABS: 0.2 10*3/uL — AB (ref 0–0.1)
BASOS PCT: 1 %
EOS PCT: 1 %
Eosinophils Absolute: 0.4 10*3/uL (ref 0–0.7)
HCT: 42 % (ref 35.0–47.0)
Hemoglobin: 13.2 g/dL (ref 12.0–16.0)
Lymphocytes Relative: 67 %
Lymphs Abs: 18.7 10*3/uL — ABNORMAL HIGH (ref 1.0–3.6)
MCH: 24.6 pg — ABNORMAL LOW (ref 26.0–34.0)
MCHC: 31.4 g/dL — ABNORMAL LOW (ref 32.0–36.0)
MCV: 78.4 fL — AB (ref 80.0–100.0)
MONO ABS: 0.8 10*3/uL (ref 0.2–0.9)
Monocytes Relative: 3 %
Neutro Abs: 7.7 10*3/uL — ABNORMAL HIGH (ref 1.4–6.5)
Neutrophils Relative %: 28 %
PLATELETS: 233 10*3/uL (ref 150–440)
RBC: 5.36 MIL/uL — ABNORMAL HIGH (ref 3.80–5.20)
RDW: 14.9 % — AB (ref 11.5–14.5)
WBC: 27.8 10*3/uL — ABNORMAL HIGH (ref 3.6–11.0)

## 2017-03-07 ENCOUNTER — Encounter (INDEPENDENT_AMBULATORY_CARE_PROVIDER_SITE_OTHER): Payer: Self-pay

## 2017-03-07 ENCOUNTER — Encounter (INDEPENDENT_AMBULATORY_CARE_PROVIDER_SITE_OTHER): Payer: Self-pay | Admitting: Vascular Surgery

## 2017-03-07 ENCOUNTER — Ambulatory Visit (INDEPENDENT_AMBULATORY_CARE_PROVIDER_SITE_OTHER): Payer: Medicare Other

## 2017-03-07 ENCOUNTER — Ambulatory Visit (INDEPENDENT_AMBULATORY_CARE_PROVIDER_SITE_OTHER): Payer: Self-pay | Admitting: Vascular Surgery

## 2017-03-07 ENCOUNTER — Ambulatory Visit (INDEPENDENT_AMBULATORY_CARE_PROVIDER_SITE_OTHER): Payer: Medicare Other | Admitting: Vascular Surgery

## 2017-03-07 VITALS — BP 192/77 | HR 72 | Resp 16 | Ht 63.0 in | Wt 167.0 lb

## 2017-03-07 DIAGNOSIS — E785 Hyperlipidemia, unspecified: Secondary | ICD-10-CM

## 2017-03-07 DIAGNOSIS — I739 Peripheral vascular disease, unspecified: Secondary | ICD-10-CM

## 2017-03-07 DIAGNOSIS — E1149 Type 2 diabetes mellitus with other diabetic neurological complication: Secondary | ICD-10-CM

## 2017-03-07 NOTE — Progress Notes (Signed)
Subjective:    Patient ID: [name], female    DOB: 04-27-1951, 66 y.o.   MRN: [id number] Chief Complaint  Patient presents with  . Re-evaluation    6 month follow up   Patient presents for six month lower extremity atherosclerotic disease follow up. The patient underwent an ABI which was notable for no evidence of significant right lower extremity arterial disease. Right toe brachial index abnormal. The patient is without complaint today. She is doing well. The patient denies any claudication like symptoms, rest pain or ulcers to her feet / toes.    Review of Systems  All other systems reviewed and are negative.     Objective:   Physical Exam  Constitutional: She is oriented to person, place, and time. She appears well-developed and well-nourished. No distress.  s/p LLE amputation using prothesis.  HENT:  Head: Normocephalic and atraumatic.  Eyes: Conjunctivae are normal. Pupils are equal, round, and reactive to light.  Neck: Normal range of motion.  Cardiovascular: Normal rate and regular rhythm.   Pulses:      Radial pulses are 2+ on the right side.       Dorsalis pedis pulses are 2+ on the right side.       Posterior tibial pulses are 1+ on the right side.  Pulmonary/Chest: Effort normal.  Musculoskeletal: Normal range of motion. She exhibits no edema.  Neurological: She is alert and oriented to person, place, and time.  Skin: Skin is warm and dry. She is not diaphoretic.  Psychiatric: She has a normal mood and affect. Her behavior is normal. Judgment and thought content normal.  Vitals reviewed.  BP (!) 192/77 (BP Location: Right Arm)   Pulse 72   Resp 16   Ht 5\' 3"  (1.6 m)   Wt 167 lb (75.8 kg)   BMI 29.58 kg/m   Past Medical History:  Diagnosis Date  . Anemia   . Arthritis   . Atrial fibrillation ([place])   . Chicken pox   . Diabetic neuropathy ([place])   . DM type 2 (diabetes mellitus, type 2) ([place])   . History of gastric ulcer   . Hypertension   .  Peripheral vascular disease [facility])    Social History   Social History  . Marital status: Divorced    Spouse name: N/A  . Number of children: N/A  . Years of education: N/A   Occupational History  . Not on file.   Social History Main Topics  . Smoking status: Former Smoker    Packs/day: 1.00    Years: 40.00    Types: Cigarettes    Quit date: 07/21/2009  . Smokeless tobacco: Never Used  . Alcohol use 0.6 oz/week    1 Glasses of wine per week     Comment: occas  . Drug use: No  . Sexual activity: Not Currently   Other Topics Concern  . Not on file   Social History Narrative   ** Merged History Encounter **       Past Surgical History:  Procedure Laterality Date  . ABDOMINAL HYSTERECTOMY    . ADENOIDECTOMY     5 yrs  . AMPUTATION Left 08/17/2015   Procedure: AMPUTATION BELOW KNEE;  Surgeon: Algernon Huxley, MD;  Location: [facility];  Service: Vascular;  Laterality: Left;  . CARDIOVERSION    . CATARACT EXTRACTION    . CATARACT EXTRACTION W/PHACO Left 07/16/2015   Procedure: CATARACT EXTRACTION PHACO AND INTRAOCULAR LENS PLACEMENT (IOC);  Surgeon:  Lyla Glassing, MD;  Location: [facility];  Service: Ophthalmology;  Laterality: Left;  [place]: 01:16.4   . ENDOVENOUS ABLATION SAPHENOUS VEIN W/ LASER    . FOOT SURGERY Right   . OOPHORECTOMY    . PERIPHERAL VASCULAR CATHETERIZATION Left 08/05/2015   Procedure: Lower Extremity Angiography;  Surgeon: Algernon Huxley, MD;  Location: [place] [facility];  Service: Cardiovascular;  Laterality: Left;  . PERIPHERAL VASCULAR CATHETERIZATION  08/05/2015   Procedure: Lower Extremity Intervention;  Surgeon: Algernon Huxley, MD;  Location: [place] [facility];  Service: Cardiovascular;;   Family History  Problem Relation Age of Onset  . Arthritis Mother   . Hyperlipidemia Mother   . Hypertension Mother   . Breast cancer Mother 41  . Diabetes Mother   . COPD Mother   . Heart disease Mother   . Arthritis Father   . Hyperlipidemia Father   .  Hypertension Father   . Diabetes Father   . Heart disease Father   . Arthritis Maternal Grandmother   . Stroke Paternal Grandmother   . Breast cancer Maternal Aunt    Allergies  Allergen Reactions  . Codeine Nausea And Vomiting  . Tape Rash      Assessment & Plan:  Patient presents for six month lower extremity atherosclerotic disease follow up. The patient underwent an ABI which was notable for no evidence of significant right lower extremity arterial disease. Right toe brachial index abnormal. The patient is without complaint today. She is doing well. The patient denies any claudication like symptoms, rest pain or ulcers to her feet / toes.   1. Peripheral vascular disease (HCC) - Stable ABI stable. Asymptomatic. No indication for intervention at this time. Studies reviewed with patient. Patient to remain abstinent of tobacco use. I have discussed with the patient at length the risk factors for and pathogenesis of atherosclerotic disease and encouraged a healthy diet, regular exercise regimen and blood pressure / glucose control.  The patient was encouraged to call the office in the interim if he experiences any claudication like symptoms, rest pain or ulcers to his feet / toes. Patient to follow up in six months.  - VAS [place] ABI WITH/WO TBI; Future  2. DM (diabetes mellitus), type 2 with neurological complications (HCC) - Stable Encouraged good control as its slows the progression of atherosclerotic disease  3. Hyperlipidemia, unspecified hyperlipidemia type - Stable Encouraged good control as its slows the progression of atherosclerotic disease  Current Outpatient Prescriptions on File Prior to Visit  Medication Sig Dispense Refill  . acetaminophen (TYLENOL) 500 MG tablet Take 500 mg by mouth every 6 (six) hours as needed.    Marland Kitchen aspirin EC 81 MG tablet Take 81 mg by mouth 2 (two) times daily.    Marland Kitchen atorvastatin (LIPITOR) 40 MG tablet Take 40 mg by mouth at bedtime.    . Biotin  2500 MCG CAPS Take 2,500 mcg by mouth.    . Calcium-Magnesium-Vitamin D (CALCIUM 500 PO) Take 500 mg by mouth 2 (two) times daily.    . Cholecalciferol (D3 ADULT PO) Take 1,000 Units by mouth once.    . diphenhydrAMINE (SOMINEX) 25 MG tablet Take 25 mg by mouth 2 (two) times daily.    . furosemide (LASIX) 20 MG tablet Take 1 tablet (20 mg total) by mouth daily as needed. 30 tablet 3  . gabapentin (NEURONTIN) 100 MG capsule TAKE 1 CAPSULE BY MOUTH THREE TIMES DAILY 90 capsule 1  . insulin NPH Human (HUMULIN N)  100 UNIT/ML injection Inject 0.06 mLs (6 Units total) into the skin 2 (two) times daily before a meal. 10 mL 11  . lisinopril (PRINIVIL,ZESTRIL) 2.5 MG tablet TAKE 1 TABLET EVERY DAY 90 tablet 1  . Omega-3 Fatty Acids (FISH OIL) 1200 MG CAPS Take 300 mg by mouth 2 (two) times daily.     Marland Kitchen pyridOXINE (VITAMIN B-6) 100 MG tablet Take 100 mg by mouth at bedtime.     . sotalol (BETAPACE) 120 MG tablet Take 1 tablet (120 mg total) by mouth 2 (two) times daily. 180 tablet 3  . vitamin B-12 (CYANOCOBALAMIN) 1000 MCG tablet Take 1,000 mcg by mouth 2 (two) times daily.    . vitamin E 400 UNIT capsule Take 400 Units by mouth 2 (two) times daily.     No current facility-administered medications on file prior to visit.     There are no Patient Instructions on file for this visit. No Follow-up on file.   Jacquelynn Friend A Makell Cyr, PA-C

## 2017-03-21 ENCOUNTER — Telehealth: Payer: Self-pay | Admitting: Family Medicine

## 2017-03-21 NOTE — Telephone Encounter (Signed)
Left pt message asking to call Allison back directly at [phone] to schedule AWV. Thanks! °

## 2017-03-27 ENCOUNTER — Ambulatory Visit (INDEPENDENT_AMBULATORY_CARE_PROVIDER_SITE_OTHER): Payer: Medicare Other

## 2017-03-27 ENCOUNTER — Telehealth: Payer: Self-pay

## 2017-03-27 VITALS — BP 142/78 | HR 63 | Resp 14 | Ht 63.0 in | Wt 169.1 lb

## 2017-03-27 DIAGNOSIS — Z23 Encounter for immunization: Secondary | ICD-10-CM

## 2017-03-27 DIAGNOSIS — Z0000 Encounter for general adult medical examination without abnormal findings: Secondary | ICD-10-CM | POA: Diagnosis not present

## 2017-03-27 NOTE — Telephone Encounter (Signed)
Pt said she received a phone call regarding the previously ordered Cologuard by Dr. Lacinda Axon.  Stated she was told there was a problem with the process of the Cologuard at that time and would now like to know if that issue has resolved to receive her kit.   She also wants to know if she was placed on the Lisinopril due to her kidney function verses blood pressure.

## 2017-03-27 NOTE — Patient Instructions (Addendum)
  Ms. Kronberg , Thank you for taking time to come for your Medicare Wellness Visit. I appreciate your ongoing commitment to your health goals. Please review the following plan we discussed and let me know if I can assist you in the future.   Follow up with Dr. Lacinda Axon as needed.    Bring a copy of your [place] and/or Living Will to be scanned into chart.  Have a great day!  These are the goals we discussed: Goals    . Increase physical activity          Stay active and walk for exercise       This is a list of the screening recommended for you and due dates:  Health Maintenance  Topic Date Due  . Cologuard (Stool DNA test)  08/06/1999  . Pneumonia vaccines (2 of 2 - PPSV23) 11/12/2016  . Hemoglobin A1C  02/09/2017  . Flu Shot  09/03/2029*  . Eye exam for diabetics  05/25/2017  . Complete foot exam   02/07/2018  . Mammogram  11/02/2018  . Tetanus Vaccine  11/12/2025  . DEXA scan (bone density measurement)  Completed  *Topic was postponed. The date shown is not the original due date.

## 2017-03-27 NOTE — Progress Notes (Signed)
Subjective:   [name] is a 66 y.o. female who presents for an Initial Medicare Annual Wellness Visit.  Review of Systems    No ROS.  Medicare Wellness Visit.  Cardiac Risk Factors include: advanced age (>63men, >34 women);diabetes mellitus;hypertension     Objective:    Today's Vitals   03/27/17 1558  BP: (!) 142/78  Pulse: 63  Resp: 14  SpO2: 98%  Weight: 169 lb 1.9 oz (76.7 kg)  Height: 5\' 3"  (1.6 m)   Body mass index is 29.96 kg/m.   Current Medications (verified) Outpatient Encounter Prescriptions as of 03/27/2017  Medication Sig  . acetaminophen (TYLENOL) 500 MG tablet Take 500 mg by mouth every 6 (six) hours as needed.  Marland Kitchen aspirin EC 81 MG tablet Take 81 mg by mouth 2 (two) times daily.  Marland Kitchen atorvastatin (LIPITOR) 40 MG tablet Take 40 mg by mouth at bedtime.  . Biotin 2500 MCG CAPS Take 2,500 mcg by mouth.  . Calcium-Magnesium-Vitamin D (CALCIUM 500 PO) Take 500 mg by mouth 2 (two) times daily.  . Cholecalciferol (D3 ADULT PO) Take 1,000 Units by mouth once.  . diphenhydrAMINE (SOMINEX) 25 MG tablet Take 25 mg by mouth 2 (two) times daily.  . furosemide (LASIX) 20 MG tablet Take 1 tablet (20 mg total) by mouth daily as needed.  . gabapentin (NEURONTIN) 100 MG capsule TAKE 1 CAPSULE BY MOUTH THREE TIMES DAILY  . insulin NPH Human (HUMULIN N) 100 UNIT/ML injection Inject 0.06 mLs (6 Units total) into the skin 2 (two) times daily before a meal.  . lisinopril (PRINIVIL,ZESTRIL) 2.5 MG tablet TAKE 1 TABLET EVERY DAY  . Omega-3 Fatty Acids (FISH OIL) 1200 MG CAPS Take 300 mg by mouth 2 (two) times daily.   Marland Kitchen POTASSIUM BICARBONATE PO Take 1 tablet by mouth daily.  Marland Kitchen pyridOXINE (VITAMIN B-6) 100 MG tablet Take 100 mg by mouth at bedtime.   . sotalol (BETAPACE) 120 MG tablet Take 1 tablet (120 mg total) by mouth 2 (two) times daily.  . vitamin B-12 (CYANOCOBALAMIN) 1000 MCG tablet Take 1,000 mcg by mouth 2 (two) times daily.  . vitamin E 400 UNIT capsule Take 400 Units  by mouth 2 (two) times daily.   No facility-administered encounter medications on file as of 03/27/2017.     Allergies (verified) Codeine and Tape   History: Past Medical History:  Diagnosis Date  . Anemia   . Arthritis   . Atrial fibrillation ([place])   . Chicken pox   . Diabetic neuropathy ([place])   . DM type 2 (diabetes mellitus, type 2) ([place])   . History of gastric ulcer   . Hypertension   . Peripheral vascular disease [facility])    Past Surgical History:  Procedure Laterality Date  . ABDOMINAL HYSTERECTOMY    . ADENOIDECTOMY     5 yrs  . AMPUTATION Left 08/17/2015   Procedure: AMPUTATION BELOW KNEE;  Surgeon: Algernon Huxley, MD;  Location: [facility] ORS;  Service: Vascular;  Laterality: Left;  . CARDIOVERSION    . CATARACT EXTRACTION    . CATARACT EXTRACTION W/PHACO Left 07/16/2015   Procedure: CATARACT EXTRACTION PHACO AND INTRAOCULAR LENS PLACEMENT (IOC);  Surgeon: Lyla Glassing, MD;  Location: [facility] ORS;  Service: Ophthalmology;  Laterality: Left;  [place]: 01:16.4   . ENDOVENOUS ABLATION SAPHENOUS VEIN W/ LASER    . FOOT SURGERY Right   . OOPHORECTOMY    . PERIPHERAL VASCULAR CATHETERIZATION Left 08/05/2015   Procedure: Lower Extremity Angiography;  Surgeon:  Algernon Huxley, MD;  Location: [place] [facility];  Service: Cardiovascular;  Laterality: Left;  . PERIPHERAL VASCULAR CATHETERIZATION  08/05/2015   Procedure: Lower Extremity Intervention;  Surgeon: Algernon Huxley, MD;  Location: [place] CV LAB;  Service: Cardiovascular;;   Family History  Problem Relation Age of Onset  . Arthritis Mother   . Hyperlipidemia Mother   . Hypertension Mother   . Breast cancer Mother 42  . Diabetes Mother   . COPD Mother   . Heart disease Mother   . Endometriosis Mother   . Arthritis Father   . Hyperlipidemia Father   . Hypertension Father   . Diabetes Father   . Heart disease Father   . Arthritis Maternal Grandmother   . Stroke Paternal Grandmother   . Breast cancer Maternal Aunt     Social History   Occupational History  . Not on file.   Social History Main Topics  . Smoking status: Former Smoker    Packs/day: 1.00    Years: 40.00    Types: Cigarettes    Quit date: 07/21/2009  . Smokeless tobacco: Never Used  . Alcohol use 0.6 oz/week    1 Glasses of wine per week     Comment: occas  . Drug use: No  . Sexual activity: Not Currently    Tobacco Counseling Counseling given: Not Answered   Activities of Daily Living In your present state of health, do you have any difficulty performing the following activities: 03/27/2017  Hearing? Y  Vision? N  Difficulty concentrating or making decisions? N  Walking or climbing stairs? Y  Dressing or bathing? N  Doing errands, shopping? N  Preparing Food and eating ? N  Using the Toilet? N  In the past six months, have you accidently leaked urine? Y  Do you have problems with loss of bowel control? N  Managing your Medications? N  Managing your Finances? N  Housekeeping or managing your Housekeeping? N  Some recent data might be hidden    Immunizations and Health Maintenance Immunization History  Administered Date(s) Administered  . Pneumococcal Conjugate-13 11/13/2015  . Pneumococcal Polysaccharide-23 03/27/2017   Health Maintenance Due  Topic Date Due  . Fecal DNA (Cologuard)  08/06/1999  . PNA vac Low Risk Adult (2 of 2 - PPSV23) 11/12/2016  . HEMOGLOBIN A1C  02/09/2017    Patient Care Team: Coral Spikes, DO as PCP - General (Family Medicine) Coral Spikes, DO (Family Medicine) Minna Merritts, MD as Consulting Physician (Cardiology)  Indicate any recent Medical Services you may have received from other than [facility] providers in the past year (date may be approximate).     Assessment:   This is a routine wellness examination for [name]. The goal of the wellness visit is to assist the patient how to close the gaps in care and create a preventative care plan for the patient.   Taking calcium VIT  D as appropriate/Osteoporosis risk reviewed.  Medications reviewed; taking without issues or barriers.  Safety issues reviewed; smoke detectors in the home. No firearms in the home.  Wears seatbelts when driving or riding with others. Patient does wear sunscreen or protective clothing when in direct sunlight. No violence in the home.  Patient is alert, normal appearance, oriented to person/place/and time. Correctly identified the president of the [place], recall of 3/3 words, and performing simple calculations.  Patient displays appropriate judgement and can read correct time from watch face.  No new identified risk were  noted.  No failures at ADL's or IADL's.   BMI- discussed the importance of a healthy diet, water intake and exercise. Educational material provided.   HTN- followed by PCP.  Dental- every 12 months.  Foot exam: Followed by Dr. Elvina Mattes, visits every 3 months.  Sleep patterns- Sleeps 8 hours at night.  Wakes feeling rested.  Pneumococcal vaccine administered L deltoid, tolerated well. Educational material provided.  Patient Concerns: None at this time. Follow up with PCP as needed.  Hearing/Vision screen Hearing Screening Comments: Followed by Carita Pian. Visits as needed. Scar tissue R ear.  Hx of busted ear drum. Hearing aid, bilateral Vision Screening Comments: Followed by [facility] (Dr. Clayton Bibles) Wears corrective lenses No retinopathy reported Last OV 2017 Cataract extraction, L eye only Visual acuity not assessed per patient preference since they have regular follow up with the ophthalmologist  Dietary issues and exercise activities discussed: Current Exercise Habits: Home exercise routine, Time (Minutes): 10, Frequency (Times/Week): 1, Weekly Exercise (Minutes/Week): 10, Intensity: Mild  Goals    . Increase physical activity          Stay active and walk for exercise      Depression Screen PHQ 2/9 Scores 03/27/2017 07/27/2015  PHQ - 2 Score 0 0     Fall Risk Fall Risk  03/27/2017 07/27/2015  Falls in the past year? No No    Cognitive Function: MMSE - Mini Mental State Exam 03/27/2017  Orientation to time 5  Orientation to Place 5  Registration 3  Attention/ Calculation 5  Recall 3  Language- name 2 objects 2  Language- repeat 1  Language- follow 3 step command 3  Language- read & follow direction 1  Write a sentence 1  Copy design 1  Total score 30        Screening Tests Health Maintenance  Topic Date Due  . Fecal DNA (Cologuard)  08/06/1999  . PNA vac Low Risk Adult (2 of 2 - PPSV23) 11/12/2016  . HEMOGLOBIN A1C  02/09/2017  . INFLUENZA VACCINE  09/03/2029 (Originally 07/05/2017)  . OPHTHALMOLOGY EXAM  05/25/2017  . FOOT EXAM  02/07/2018  . MAMMOGRAM  11/02/2018  . TETANUS/TDAP  11/12/2025  . DEXA SCAN  Completed      Plan:    End of life planning; Advance aging; Advanced directives discussed. Copy of current HCPOA/Living Will requested.    Medicare Attestation I have personally reviewed: The patient's medical and social history Their use of alcohol, tobacco or illicit drugs Their current medications and supplements The patient's functional ability including ADLs,fall risks, home safety risks, cognitive, and hearing and visual impairment Diet and physical activities Evidence for depression   The patient's weight, height, BMI, and visual acuity have been recorded in the chart.  I have made referrals and provided education to the patient based on review of the above and I have provided the patient with a written personalized care plan for preventive services.    During the course of the visit, Sabra was educated and counseled about the following appropriate screening and preventive services:   Vaccines to include Pneumoccal, Influenza, Hepatitis B, Td, Zostavax, HCV  Colorectal cancer screening-Cologuard preferred  Bone density screening-UTD  Diabetes-followed by PCP  Glaucoma screening-annual  eye exam  Mammography/PAP-UTD  Nutrition counseling  Patient Instructions (the written plan) were given to the patient.    Varney Biles, LPN   1/61/0960

## 2017-03-28 NOTE — Progress Notes (Signed)
Care was provided under my supervision. I agree with the management as indicated in the note.  Kaliope Quinonez DO  

## 2017-03-28 NOTE — Telephone Encounter (Signed)
Pt called and informed that cologuard was sent in for her today 03/28/17.

## 2017-03-29 NOTE — Telephone Encounter (Signed)
Pt was called and told medication was for both.

## 2017-03-29 NOTE — Telephone Encounter (Signed)
She is on lisinopril for both reasons.

## 2017-05-02 LAB — COLOGUARD: Cologuard: NEGATIVE

## 2017-05-03 NOTE — Telephone Encounter (Signed)
Seen 03/27/17 °

## 2017-05-08 ENCOUNTER — Telehealth: Payer: Self-pay

## 2017-05-08 ENCOUNTER — Encounter: Payer: Self-pay | Admitting: Family Medicine

## 2017-05-08 NOTE — Telephone Encounter (Signed)
Inform patient

## 2017-05-08 NOTE — Telephone Encounter (Signed)
Left voice mail to call back 

## 2017-05-08 NOTE — Telephone Encounter (Signed)
Cologuard result is negative.

## 2017-05-09 NOTE — Telephone Encounter (Signed)
Patient was informed of results,pt had no questions and understood

## 2017-05-18 NOTE — Progress Notes (Signed)
[place]  [phone] [phone]) [phone]  ID: [name] OB: 11-29-1951  MR#: [id number]  XAJ#:[id number]  Patient Care Team: Coral Spikes, DO as PCP - General (Family Medicine) Coral Spikes, DO (Family Medicine) Minna Merritts, MD as Consulting Physician (Cardiology)  CHIEF COMPLAINT: CLL, Rai stage 0  INTERVAL HISTORY: Patient returns to clinic for repeat laboratory work and further evaluation. She is anxious but otherwise feels well. She has no neurologic complaints. She denies any fevers, night sweats, or weight loss. She has noted no lymphadenopathy. She has no chest pain or shortness of breath. She denies any nausea, vomiting, constipation, or diarrhea. She has no urinary complaints. Patient offers no specific complaints today.  REVIEW OF SYSTEMS:   Review of Systems  Constitutional: Negative.  Negative for fever, malaise/fatigue and weight loss.  Respiratory: Negative.  Negative for cough and shortness of breath.   Cardiovascular: Negative.  Negative for chest pain and leg swelling.  Gastrointestinal: Negative.  Negative for abdominal pain.  Genitourinary: Negative.   Musculoskeletal: Negative.   Neurological: Negative.  Negative for weakness.  Psychiatric/Behavioral: The patient is nervous/anxious.     As per HPI. Otherwise, a complete review of systems is negative.  PAST MEDICAL HISTORY: Past Medical History:  Diagnosis Date  . Anemia   . Arthritis   . Atrial fibrillation ([place])   . Chicken pox   . Diabetic neuropathy ([place])   . DM type 2 (diabetes mellitus, type 2) ([place])   . History of gastric ulcer   . Hypertension   . Peripheral vascular disease ([place])     PAST SURGICAL HISTORY: Past Surgical History:  Procedure Laterality Date  . ABDOMINAL HYSTERECTOMY    . ADENOIDECTOMY     5 yrs  . AMPUTATION Left 08/17/2015   Procedure: AMPUTATION BELOW KNEE;  Surgeon: Algernon Huxley, MD;  Location: [facility] ORS;  Service: Vascular;   Laterality: Left;  . CARDIOVERSION    . CATARACT EXTRACTION    . CATARACT EXTRACTION W/PHACO Left 07/16/2015   Procedure: CATARACT EXTRACTION PHACO AND INTRAOCULAR LENS PLACEMENT (IOC);  Surgeon: Lyla Glassing, MD;  Location: [facility] ORS;  Service: Ophthalmology;  Laterality: Left;  [place]: 01:16.4   . ENDOVENOUS ABLATION SAPHENOUS VEIN W/ LASER    . FOOT SURGERY Right   . OOPHORECTOMY    . PERIPHERAL VASCULAR CATHETERIZATION Left 08/05/2015   Procedure: Lower Extremity Angiography;  Surgeon: Algernon Huxley, MD;  Location: [place] [facility] LAB;  Service: Cardiovascular;  Laterality: Left;  . PERIPHERAL VASCULAR CATHETERIZATION  08/05/2015   Procedure: Lower Extremity Intervention;  Surgeon: Algernon Huxley, MD;  Location: [place] CV LAB;  Service: Cardiovascular;;    FAMILY HISTORY: Family History  Problem Relation Age of Onset  . Arthritis Mother   . Hyperlipidemia Mother   . Hypertension Mother   . Breast cancer Mother 86  . Diabetes Mother   . COPD Mother   . Heart disease Mother   . Endometriosis Mother   . Arthritis Father   . Hyperlipidemia Father   . Hypertension Father   . Diabetes Father   . Heart disease Father   . Arthritis Maternal Grandmother   . Stroke Paternal Grandmother   . Breast cancer Maternal Aunt     ADVANCED DIRECTIVES (Y/N):  N  HEALTH MAINTENANCE: Social History  Substance Use Topics  . Smoking status: Former Smoker    Packs/day: 1.00    Years: 40.00    Types: Cigarettes  Quit date: 07/21/2009  . Smokeless tobacco: Never Used  . Alcohol use 0.6 oz/week    1 Glasses of wine per week     Comment: occas     Colonoscopy:  PAP:  Bone density:  Lipid panel:  Allergies  Allergen Reactions  . Codeine Nausea And Vomiting  . Tape Rash    Current Outpatient Prescriptions  Medication Sig Dispense Refill  . acetaminophen (TYLENOL) 500 MG tablet Take 500 mg by mouth every 6 (six) hours as needed.    Marland Kitchen aspirin EC 81 MG tablet Take 81 mg by mouth 2  (two) times daily.    Marland Kitchen atorvastatin (LIPITOR) 40 MG tablet Take 40 mg by mouth at bedtime.    . Biotin 2500 MCG CAPS Take 2,500 mcg by mouth.    . Calcium-Magnesium-Vitamin D (CALCIUM 500 PO) Take 500 mg by mouth 2 (two) times daily.    . Cholecalciferol (D3 ADULT PO) Take 1,000 Units by mouth once.    . diphenhydrAMINE (SOMINEX) 25 MG tablet Take 25 mg by mouth 2 (two) times daily.    . furosemide (LASIX) 20 MG tablet Take 1 tablet (20 mg total) by mouth daily as needed. 30 tablet 3  . gabapentin (NEURONTIN) 100 MG capsule TAKE 1 CAPSULE BY MOUTH THREE TIMES DAILY 90 capsule 1  . insulin NPH Human (HUMULIN N) 100 UNIT/ML injection Inject 0.06 mLs (6 Units total) into the skin 2 (two) times daily before a meal. 10 mL 11  . lisinopril (PRINIVIL,ZESTRIL) 2.5 MG tablet TAKE 1 TABLET EVERY DAY 90 tablet 1  . Omega-3 Fatty Acids (FISH OIL) 1200 MG CAPS Take 300 mg by mouth 2 (two) times daily.     Marland Kitchen POTASSIUM BICARBONATE PO Take 1 tablet by mouth daily.    Marland Kitchen pyridOXINE (VITAMIN B-6) 100 MG tablet Take 100 mg by mouth at bedtime.     . sotalol (BETAPACE) 120 MG tablet Take 1 tablet (120 mg total) by mouth 2 (two) times daily. 180 tablet 3  . vitamin B-12 (CYANOCOBALAMIN) 1000 MCG tablet Take 1,000 mcg by mouth 2 (two) times daily.    . vitamin E 400 UNIT capsule Take 400 Units by mouth 2 (two) times daily.     No current facility-administered medications for this visit.     OBJECTIVE: Vitals:   05/19/17 1031 05/19/17 1056  BP:  (!) 155/90  Pulse:  78  Temp: (!) 87.4 F (30.8 C)      Body mass index is 29.43 kg/m.    ECOG FS:0 - Asymptomatic  General: Well-developed, well-nourished, no acute distress. Eyes: Pink conjunctiva, anicteric sclera. Lungs: Clear to auscultation bilaterally. Heart: Regular rate and rhythm. No rubs, murmurs, or gallops. Abdomen: Soft, nontender, nondistended. No organomegaly noted, normoactive bowel sounds. Musculoskeletal: No edema, cyanosis, or clubbing. Left  BKA. Neuro: Alert, answering all questions appropriately. Cranial nerves grossly intact. Skin: No rashes or petechiae noted. Psych: Normal affect.  LAB RESULTS:  Lab Results  Component Value Date   NA 136 08/12/2016   K 5.1 08/12/2016   CL 101 08/12/2016   CO2 30 08/12/2016   GLUCOSE 193 (H) 08/12/2016   BUN 22 08/12/2016   CREATININE 0.80 08/12/2016   CALCIUM 9.5 08/12/2016   PROT 7.4 08/12/2016   ALBUMIN 4.1 08/12/2016   AST 17 08/12/2016   ALT 15 08/12/2016   ALKPHOS 57 08/12/2016   BILITOT 0.6 08/12/2016   GFRNONAA 59 (L) 08/18/2015   GFRAA >60 08/18/2015    Lab Results  Component Value Date   WBC 44.1 (H) 05/19/2017   NEUTROABS 9.9 (H) 05/19/2017   HGB 13.8 05/19/2017   HCT 43.1 05/19/2017   MCV 80.8 05/19/2017   PLT 238 05/19/2017     STUDIES: No results found.  ASSESSMENT: CLL, Rai stage 0  PLAN:    1. CLL, Rai stage 0:  Diagnosis confirmed by flow cytometry. Patient's white blood cell count has trended up to 44.1. No intervention is needed. Patient does not require bone marrow biopsy. Will consider CT of the neck, chest, abdomen, pelvis to complete the staging process in the future if there is concern for progression of disease. Patient will return to clinic in 3 months with repeat laboratory work and then in 6 months for laboratory work and further evaluation. 2. Hypertension: Likely secondary to patient's increased anxiety. Continue monitoring and treatment per primary care.  Approximately 20 minutes was spent in discussion of which greater than 50% was consultation.  Patient expressed understanding and was in agreement with this plan. She also understands that She can call clinic at any time with any questions, concerns, or complaints.   Lloyd Huger, MD   05/19/2017 12:54 PM

## 2017-05-19 ENCOUNTER — Inpatient Hospital Stay (HOSPITAL_BASED_OUTPATIENT_CLINIC_OR_DEPARTMENT_OTHER): Payer: Medicare Other | Admitting: Oncology

## 2017-05-19 ENCOUNTER — Inpatient Hospital Stay: Payer: Medicare Other | Attending: Oncology

## 2017-05-19 ENCOUNTER — Encounter: Payer: Self-pay | Admitting: Oncology

## 2017-05-19 VITALS — BP 155/90 | HR 78 | Temp 87.4°F | Ht 63.0 in | Wt 166.1 lb

## 2017-05-19 DIAGNOSIS — Z79899 Other long term (current) drug therapy: Secondary | ICD-10-CM

## 2017-05-19 DIAGNOSIS — Z89512 Acquired absence of left leg below knee: Secondary | ICD-10-CM | POA: Insufficient documentation

## 2017-05-19 DIAGNOSIS — I10 Essential (primary) hypertension: Secondary | ICD-10-CM | POA: Diagnosis not present

## 2017-05-19 DIAGNOSIS — E1151 Type 2 diabetes mellitus with diabetic peripheral angiopathy without gangrene: Secondary | ICD-10-CM | POA: Insufficient documentation

## 2017-05-19 DIAGNOSIS — Z87891 Personal history of nicotine dependence: Secondary | ICD-10-CM | POA: Insufficient documentation

## 2017-05-19 DIAGNOSIS — C9110 Chronic lymphocytic leukemia of B-cell type not having achieved remission: Secondary | ICD-10-CM

## 2017-05-19 DIAGNOSIS — I4891 Unspecified atrial fibrillation: Secondary | ICD-10-CM | POA: Diagnosis not present

## 2017-05-19 DIAGNOSIS — Z8719 Personal history of other diseases of the digestive system: Secondary | ICD-10-CM | POA: Insufficient documentation

## 2017-05-19 DIAGNOSIS — Z7982 Long term (current) use of aspirin: Secondary | ICD-10-CM | POA: Insufficient documentation

## 2017-05-19 DIAGNOSIS — F419 Anxiety disorder, unspecified: Secondary | ICD-10-CM | POA: Diagnosis not present

## 2017-05-19 LAB — CBC WITH DIFFERENTIAL/PLATELET
Basophils Absolute: 0.2 10*3/uL — ABNORMAL HIGH (ref 0–0.1)
Basophils Relative: 0 %
EOS ABS: 0.5 10*3/uL (ref 0–0.7)
Eosinophils Relative: 1 %
HCT: 43.1 % (ref 35.0–47.0)
HEMOGLOBIN: 13.8 g/dL (ref 12.0–16.0)
LYMPHS ABS: 32.7 10*3/uL — AB (ref 1.0–3.6)
Lymphocytes Relative: 75 %
MCH: 25.8 pg — AB (ref 26.0–34.0)
MCHC: 31.9 g/dL — ABNORMAL LOW (ref 32.0–36.0)
MCV: 80.8 fL (ref 80.0–100.0)
MONOS PCT: 2 %
Monocytes Absolute: 0.9 10*3/uL (ref 0.2–0.9)
NEUTROS PCT: 22 %
Neutro Abs: 9.9 10*3/uL — ABNORMAL HIGH (ref 1.4–6.5)
Platelets: 238 10*3/uL (ref 150–440)
RBC: 5.34 MIL/uL — AB (ref 3.80–5.20)
RDW: 14.4 % (ref 11.5–14.5)
WBC: 44.1 10*3/uL — AB (ref 3.6–11.0)

## 2017-05-19 NOTE — Progress Notes (Signed)
Patient here for follow up. No changes since last appointment.  

## 2017-06-08 IMAGING — US US BREAST*R* LIMITED INC AXILLA
1 series · 13 of 18 positions shown · non-contrast
Comparison: Previous exam(s).

CLINICAL DATA: 65-year-old female seen for follow-up of a probably
benign right breast finding

EXAM:
2D DIGITAL DIAGNOSTIC BILATERAL MAMMOGRAM WITH CAD AND ADJUNCT TOMO
ULTRASOUND BILATERAL BREAST

[Series 1: us breast*right* limited inc axilla · 0.07mm/px · 13 of 18 slices shown]
[im 1/18]
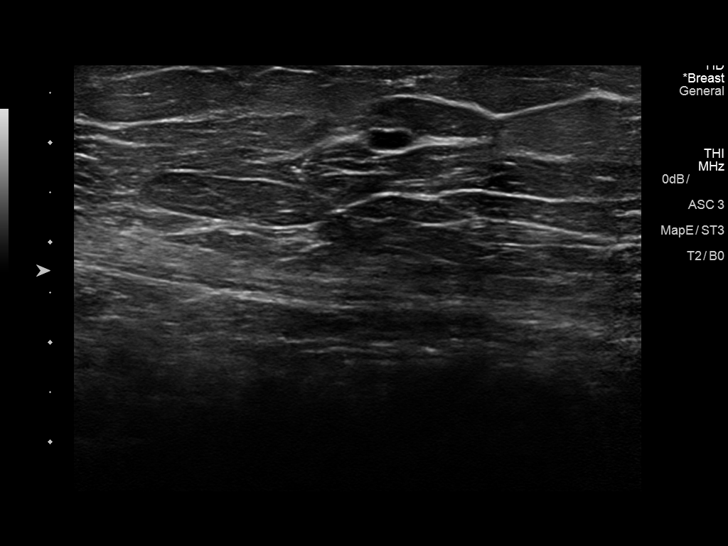
[im 3/18]
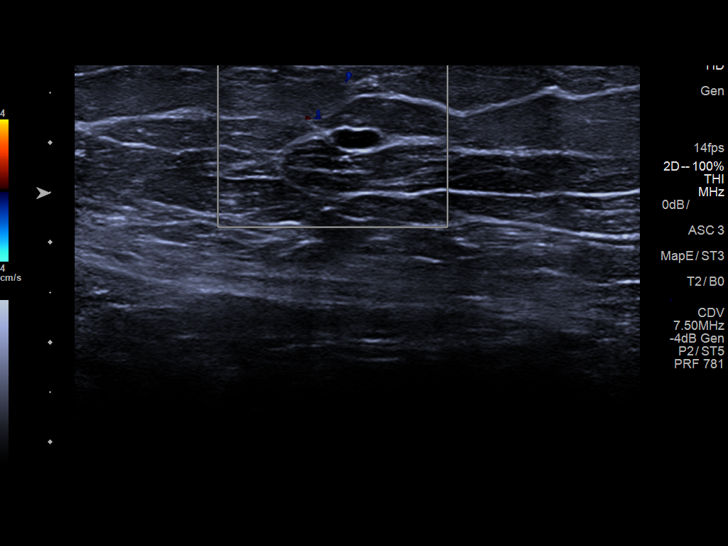
[im 4/18]
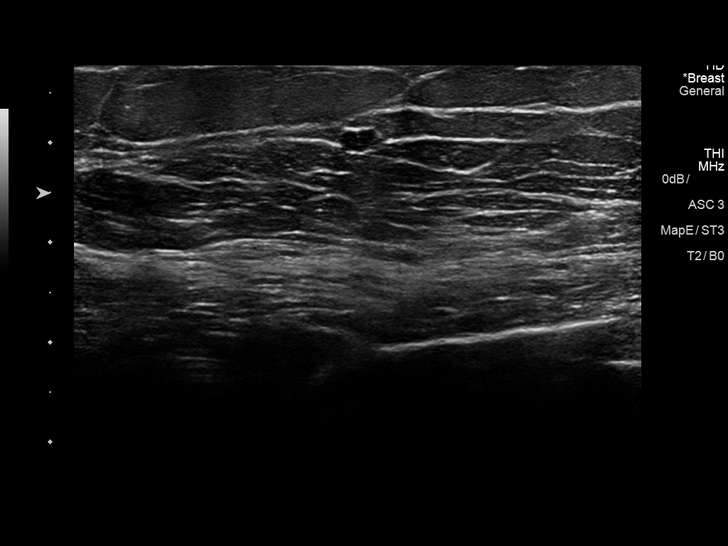
[im 5/18]
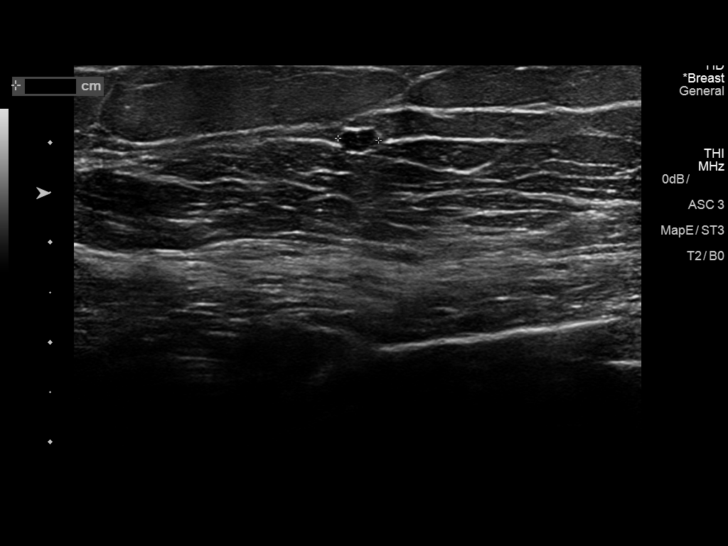
[im 7/18]
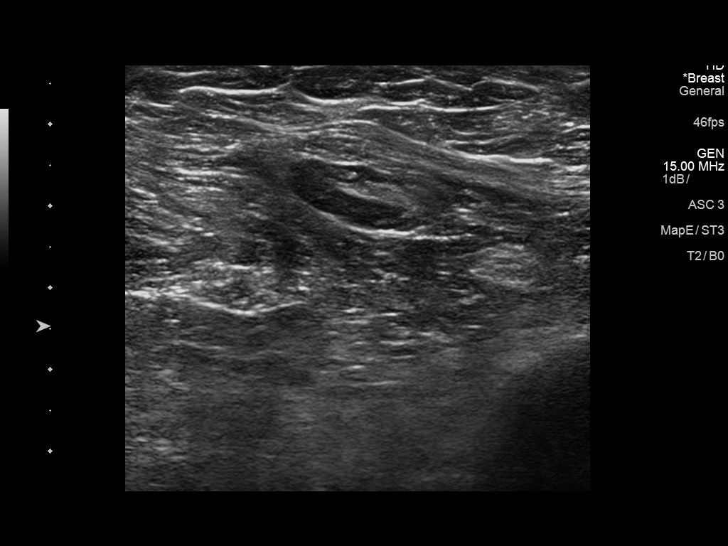
[im 8/18]
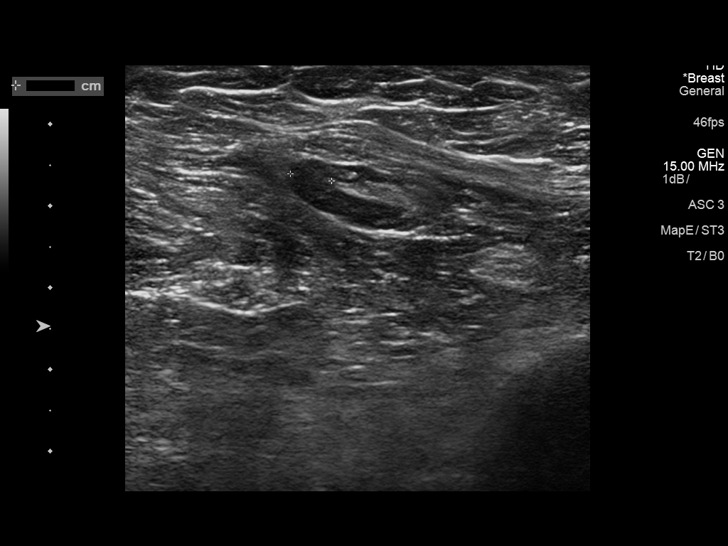
[im 10/18]
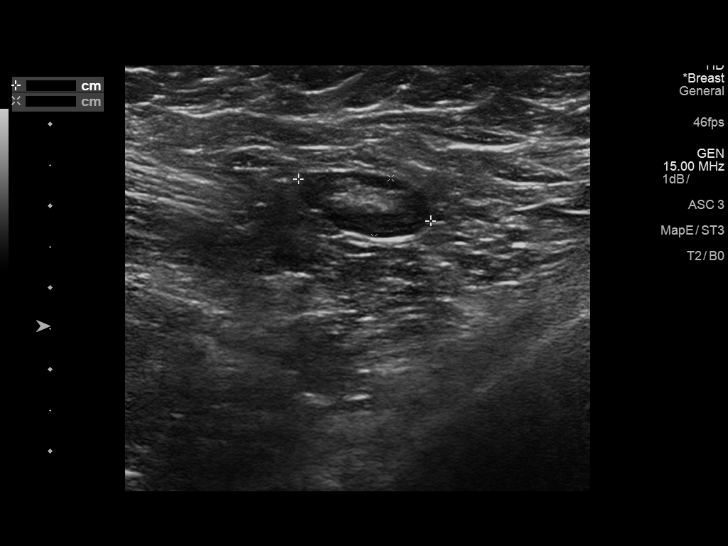
[im 11/18]
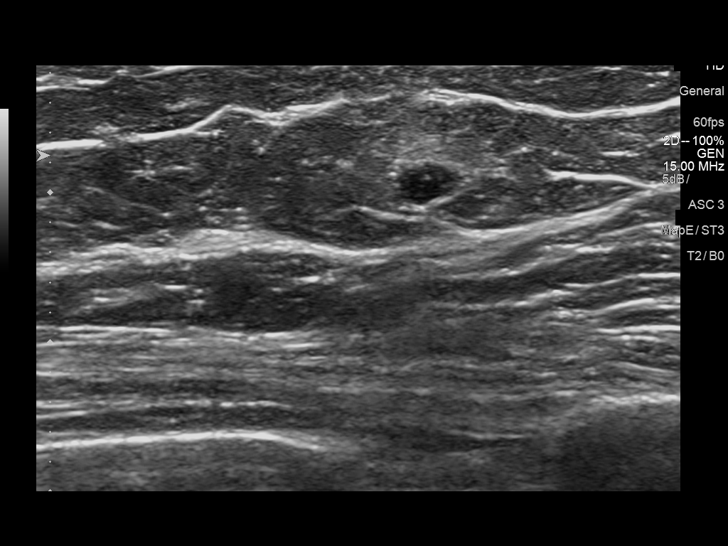
[im 12/18]
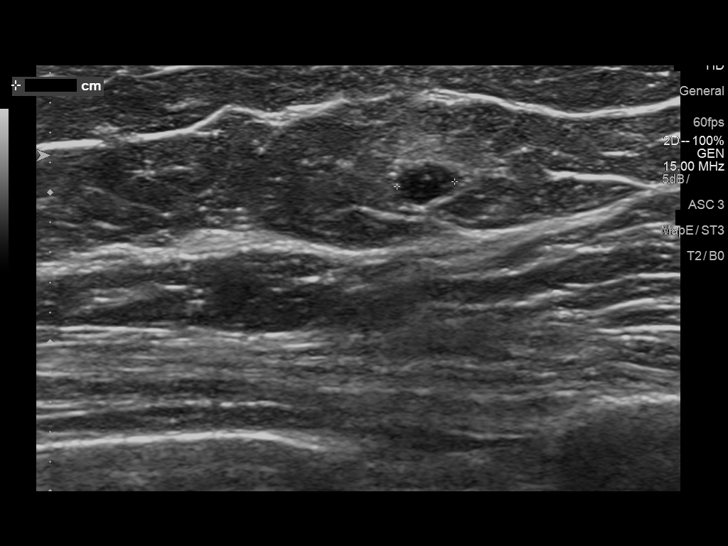
[im 14/18]
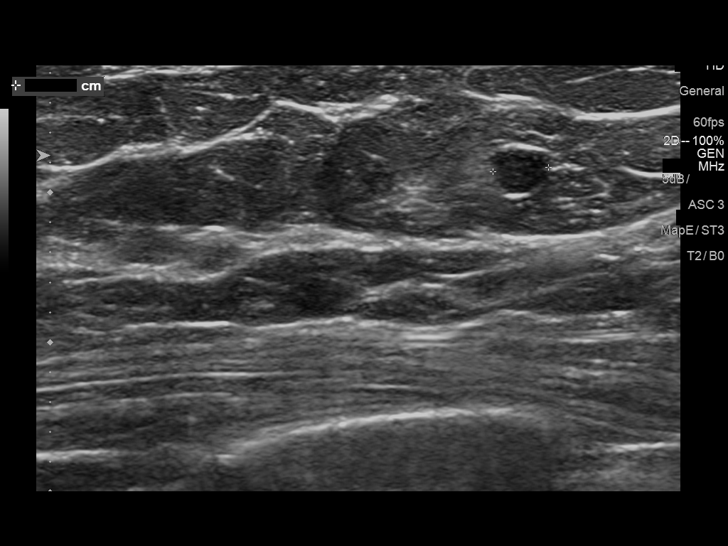
[im 15/18]
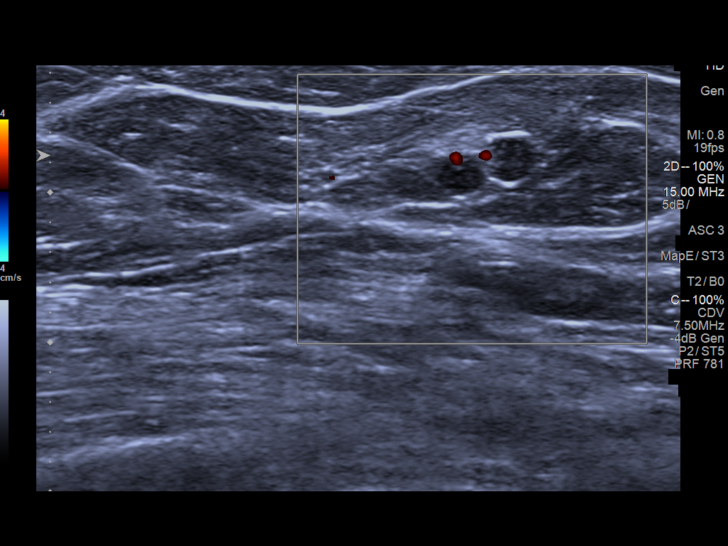
[im 16/18]
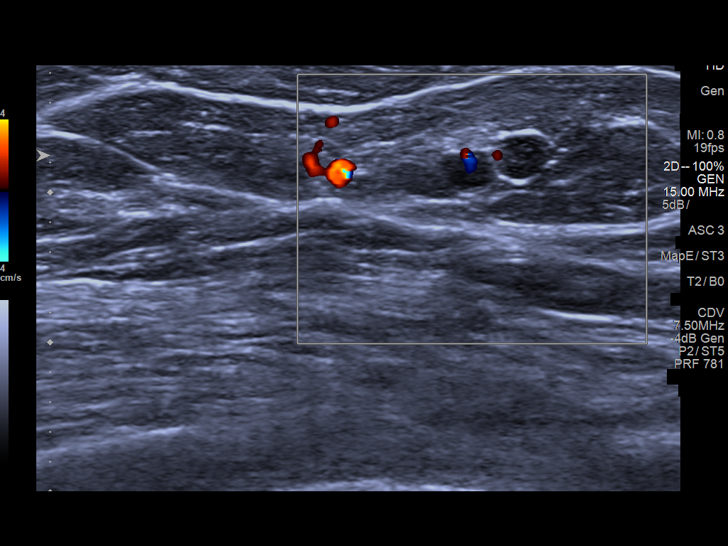
[im 18/18]
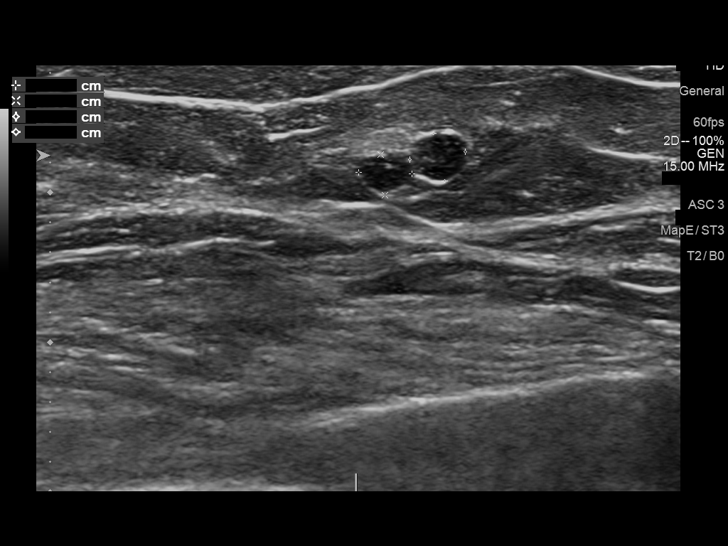

[13 of 18 positions shown; findings below may reference images not displayed]

ACR Breast Density Category b: There are scattered areas of
fibroglandular density.
FINDINGS: The previously noted oval, circumscribed mass within the superior
right breast is not significantly changed from prior exam. Two
adjacent oval, circumscribed masses within the lateral right breast
have increased in size from prior exam. On tomosynthesis imaging,
these lymph nodes demonstrate the suggestion of a central fatty
hilum. In addition, there are prominent bilateral axillary lymph
nodes.

Mammographic images were processed with CAD.

Targeted ultrasound is performed, showing an oval, circumscribed,
hypoechoic mass measuring 5 x 2 x 4 mm at 12 o'clock, 4 cm from the
nipple with increased through transmission, likely representing a
small complicated cyst. At 8 o'clock, 5 cm from the nipple, there
are 2 adjacent oval, circumscribed, hypoechoic masses measuring 4 x
3 x 4 mm and 4 x 3 x 4 mm. There is central vascularity within both
masses which are thought to represent intramammary lymph nodes.
Ultrasound of the bilateral axilla demonstrates several prominent
axillary lymph nodes with cortical thickening measuring 4-5 mm.
IMPRESSION: 1. Probable left breast cyst at 12 o'clock, 4 cm from the nipple.
2. Enlarged right intramammary lymph node and bilateral axillary
lymphadenopathy. Findings are likely related to the patient's known
chronic lymphocytic leukemia. Findings were discussed with the
patient's oncologist, Dr. Bedja, via telephone on 11/03/2016.

RECOMMENDATION:
1. Bilateral diagnostic mammogram and possible ultrasound in 6
months.
2. The patient reports a follow-up appointment with Dr. Bedja in
several weeks for her CLL.

I have discussed the findings and recommendations with the patient.
Results were also provided in writing at the conclusion of the
visit. If applicable, a reminder letter will be sent to the patient
regarding the next appointment.

BI-RADS CATEGORY  3: Probably benign.

## 2017-06-29 ENCOUNTER — Other Ambulatory Visit: Payer: Self-pay | Admitting: Cardiovascular Disease

## 2017-06-29 ENCOUNTER — Other Ambulatory Visit: Payer: Self-pay | Admitting: Family Medicine

## 2017-08-03 NOTE — Progress Notes (Addendum)
Cardiology Office Note  Date:  08/04/2017   ID:  [name], DOB Apr 16, 1951, MRN [id number]  PCP:  Coral Spikes, DO   Chief Complaint  Patient presents with  . other    6 month f/u no complaints today. Meds reviewed verbally with pt.    HPI:  [name] is a pleasant 66 yo woman with long history of  poorly controlled diabetes, complications including amputation ,  smoking who stopped in 03-07-2009,  hyperlipidemia,  Atrial fibrillation and cardioversion November 2010,   In the past did not want anticoagulation Who presents for routine follow-up of her hyperlipidemia and atrial fibrillation  In follow-up she reports that she feels well with no complaints Mild leg swelling on the right Takes Lasix only as needed Eating out recently with family, high water intake Does not monitor her weight Denies any palpitations concerning for atrial fibrillation Reports that her CLL is stable, followed by oncology  No regular exercise program  prosthesis for her left leg,  Walks with a walker  No palpitations concerning for atrial fibrillation She does not want anticoagulation does not monitor heart rate at home,   Previously on acruise January 2018 Poor diet, weight up 15 pounds  No recent lab work available  Previous lab work HBA1C 7.4, up from the 6 range Total chol 138, LDL 73  EKG shows NSR rate 55 bpm, no significant ST or T wave changes  Other past medical history reviewed  Diabetes numbers dramatically improved, hemoglobin A1c  12   significant stress in 07-Mar-2009, mother died, father had significant medical issues. She feels that the stress of the situation contributed to episode of atrial fibrillation.  She was placed in the hospital for several days, had cardioversion.   She was treated for warfarin the past for her atrial fibrillation, did not feel well on warfarin and this was stopped years ago.   She prefers not to start anticoagulation     PMH:   has a past  medical history of Anemia; Arthritis; Atrial fibrillation ([place]); Chicken pox; Diabetic neuropathy ([place]); DM type 2 (diabetes mellitus, type 2) ([place]); History of gastric ulcer; Hypertension; and Peripheral vascular disease ([place]).  PSH:    Past Surgical History:  Procedure Laterality Date  . ABDOMINAL HYSTERECTOMY    . ADENOIDECTOMY     5 yrs  . AMPUTATION Left 08/17/2015   Procedure: AMPUTATION BELOW KNEE;  Surgeon: Algernon Huxley, MD;  Location: [facility];  Service: Vascular;  Laterality: Left;  . CARDIOVERSION    . CATARACT EXTRACTION    . CATARACT EXTRACTION W/PHACO Left 07/16/2015   Procedure: CATARACT EXTRACTION PHACO AND INTRAOCULAR LENS PLACEMENT (IOC);  Surgeon: Lyla Glassing, MD;  Location: [facility] ORS;  Service: Ophthalmology;  Laterality: Left;  [place]: 01:16.4   . ENDOVENOUS ABLATION SAPHENOUS VEIN W/ LASER    . FOOT SURGERY Right   . OOPHORECTOMY    . PERIPHERAL VASCULAR CATHETERIZATION Left 08/05/2015   Procedure: Lower Extremity Angiography;  Surgeon: Algernon Huxley, MD;  Location: [place] [facility];  Service: Cardiovascular;  Laterality: Left;  . PERIPHERAL VASCULAR CATHETERIZATION  08/05/2015   Procedure: Lower Extremity Intervention;  Surgeon: Algernon Huxley, MD;  Location: [place] [facility];  Service: Cardiovascular;;    Current Outpatient Prescriptions  Medication Sig Dispense Refill  . acetaminophen (TYLENOL) 500 MG tablet Take 500 mg by mouth every 6 (six) hours as needed.    Marland Kitchen aspirin EC 81 MG tablet Take 81 mg by mouth  2 (two) times daily.    Marland Kitchen atorvastatin (LIPITOR) 40 MG tablet Take 40 mg by mouth at bedtime.    . B Complex-C (B-COMPLEX WITH VITAMIN C) tablet Take 1 tablet by mouth daily.    . Biotin 2500 MCG CAPS Take 2,500 mcg by mouth.    . Calcium-Magnesium-Vitamin D (CALCIUM 500 PO) Take 500 mg by mouth 2 (two) times daily.    . Cholecalciferol (D3 ADULT PO) Take 1,000 Units by mouth once.    . diphenhydrAMINE (SOMINEX) 25 MG tablet Take 25 mg by mouth 2 (two) times  daily.    . furosemide (LASIX) 20 MG tablet Take 1 tablet (20 mg total) by mouth daily as needed. 30 tablet 3  . gabapentin (NEURONTIN) 100 MG capsule TAKE 1 CAPSULE BY MOUTH THREE TIMES DAILY 90 capsule 1  . glimepiride (AMARYL) 2 MG tablet TAKE 1 TABLET BY MOUTH DAILY WITH BREAKFAST 90 tablet 0  . insulin NPH Human (HUMULIN N) 100 UNIT/ML injection Inject 0.06 mLs (6 Units total) into the skin 2 (two) times daily before a meal. 10 mL 11  . lisinopril (PRINIVIL,ZESTRIL) 2.5 MG tablet TAKE 1 TABLET EVERY DAY 90 tablet 1  . Mag Aspart-Potassium Aspart (RA POTASSIUM/MAGNESIUM PO) Take by mouth daily.    . Omega-3 Fatty Acids (FISH OIL) 1200 MG CAPS Take 300 mg by mouth 2 (two) times daily.     Marland Kitchen POTASSIUM BICARBONATE PO Take 1 tablet by mouth daily.    . sotalol (BETAPACE) 120 MG tablet TAKE ONE TABLET TWICE A DAY 180 tablet 0  . vitamin E 400 UNIT capsule Take 400 Units by mouth 2 (two) times daily.     No current facility-administered medications for this visit.      Allergies:   Codeine and Tape   Social History:  The patient  reports that she quit smoking about 8 years ago. Her smoking use included Cigarettes. She has a 40.00 pack-year smoking history. She has never used smokeless tobacco. She reports that she drinks about 0.6 oz of alcohol per week . She reports that she does not use drugs.   Family History:   family history includes Arthritis in her father, maternal grandmother, and mother; Breast cancer in her maternal aunt; Breast cancer (age of onset: 40) in her mother; COPD in her mother; Diabetes in her father and mother; Endometriosis in her mother; Heart disease in her father and mother; Hyperlipidemia in her father and mother; Hypertension in her father and mother; Stroke in her paternal grandmother.    Review of Systems: Review of Systems  Constitutional: Negative.        Weight gain  Respiratory: Negative.   Cardiovascular: Positive for leg swelling.  Gastrointestinal:  Negative.   Musculoskeletal: Negative.        Gait instability  Neurological: Negative.   Psychiatric/Behavioral: Negative.   All other systems reviewed and are negative.    PHYSICAL EXAM: VS:  BP (!) 170/60 (BP Location: Right Arm, Patient Position: Sitting, Cuff Size: Normal)   Pulse (!) 55   Ht 5\' 3"  (1.6 m)   Wt 170 lb (77.1 kg)   BMI 30.11 kg/m  , BMI Body mass index is 30.11 kg/m. GEN: Well nourished, well developed, in no acute distress  HEENT: normal  Neck: no JVD, carotid bruits, or masses Cardiac: RRR; no murmurs, rubs, or gallops,trace pitting edema right lower extremity Respiratory:  clear to auscultation bilaterally, normal work of breathing GI: soft, nontender, nondistended, + BS MS: no  deformity or atrophy ,  leg prosthesis  Skin: warm and dry, no rash Neuro:  Strength and sensation are intact Psych: euthymic mood, full affect    Recent Labs: 08/12/2016: ALT 15; BUN 22; Creatinine, Ser 0.80; Potassium 5.1; Sodium 136 05/19/2017: Hemoglobin 13.8; Platelets 238    Lipid Panel Lab Results  Component Value Date   CHOL 138 08/12/2016   HDL 35.00 (L) 08/12/2016   LDLCALC 73 08/12/2016   TRIG 154.0 (H) 08/12/2016      Wt Readings from Last 3 Encounters:  08/04/17 170 lb (77.1 kg)  05/19/17 166 lb 1.9 oz (75.4 kg)  03/27/17 169 lb 1.9 oz (76.7 kg)       ASSESSMENT AND PLAN:  Paroxysmal atrial fibrillation (HCC) - Plan: EKG 12-Lead On previous visits, she maintained she does not want anticoagulation Recommended she monitor her heart rate at home with her wrist blood pressure cuff  Hyperlipidemia, unspecified hyperlipidemia type - Plan: EKG 12-Lead Recent lab work not available  Essential hypertension - Plan: EKG 12-Lead Initial blood pressure elevated, improved on recheck  Peripheral vascular disease ([place]) - Plan: EKG 12-Lead Followed by Dr. Lucky Cowboy  DM (diabetes mellitus), type 2 with neurological complications ([place]) Recommended she contact primary  care for her annual visit lab work Recommended low carbohydrate, decrease her bread intake  CLL (chronic lymphocytic leukemia) ([place]) Followed by oncology, currently on no therapy  Leg swelling Recommended she take Lasix for leg edema especially if she goes out to dinner/lunch [place] on her fluid intake  Former smoker   Total encounter time more than 25 minutes  Greater than 50% was spent in counseling and coordination of care with the patient  Disposition:   F/U  12 months   Orders Placed This Encounter  Procedures  . EKG 12-Lead     Signed, Esmond Plants, M.D., Ph.D. 08/04/2017  [place], [place]

## 2017-08-04 ENCOUNTER — Encounter: Payer: Self-pay | Admitting: Cardiovascular Disease

## 2017-08-04 ENCOUNTER — Ambulatory Visit (INDEPENDENT_AMBULATORY_CARE_PROVIDER_SITE_OTHER): Payer: Medicare Other | Admitting: Cardiovascular Disease

## 2017-08-04 VITALS — BP 130/60 | HR 55 | Ht 63.0 in | Wt 170.0 lb

## 2017-08-04 DIAGNOSIS — E1149 Type 2 diabetes mellitus with other diabetic neurological complication: Secondary | ICD-10-CM | POA: Diagnosis not present

## 2017-08-04 DIAGNOSIS — I10 Essential (primary) hypertension: Secondary | ICD-10-CM | POA: Diagnosis not present

## 2017-08-04 DIAGNOSIS — C9190 Lymphoid leukemia, unspecified not having achieved remission: Secondary | ICD-10-CM

## 2017-08-04 DIAGNOSIS — I739 Peripheral vascular disease, unspecified: Secondary | ICD-10-CM | POA: Diagnosis not present

## 2017-08-04 DIAGNOSIS — Z89512 Acquired absence of left leg below knee: Secondary | ICD-10-CM

## 2017-08-04 DIAGNOSIS — I480 Paroxysmal atrial fibrillation: Secondary | ICD-10-CM

## 2017-08-04 DIAGNOSIS — E782 Mixed hyperlipidemia: Secondary | ICD-10-CM | POA: Diagnosis not present

## 2017-08-04 DIAGNOSIS — C9110 Chronic lymphocytic leukemia of B-cell type not having achieved remission: Secondary | ICD-10-CM

## 2017-08-04 NOTE — Patient Instructions (Signed)

## 2017-08-18 ENCOUNTER — Other Ambulatory Visit: Payer: Self-pay

## 2017-08-25 ENCOUNTER — Inpatient Hospital Stay: Payer: Medicare Other | Attending: Oncology

## 2017-08-25 DIAGNOSIS — I10 Essential (primary) hypertension: Secondary | ICD-10-CM | POA: Insufficient documentation

## 2017-08-25 DIAGNOSIS — Z8719 Personal history of other diseases of the digestive system: Secondary | ICD-10-CM | POA: Diagnosis not present

## 2017-08-25 DIAGNOSIS — F419 Anxiety disorder, unspecified: Secondary | ICD-10-CM | POA: Insufficient documentation

## 2017-08-25 DIAGNOSIS — Z89512 Acquired absence of left leg below knee: Secondary | ICD-10-CM | POA: Insufficient documentation

## 2017-08-25 DIAGNOSIS — Z79899 Other long term (current) drug therapy: Secondary | ICD-10-CM | POA: Diagnosis not present

## 2017-08-25 DIAGNOSIS — Z7982 Long term (current) use of aspirin: Secondary | ICD-10-CM | POA: Diagnosis not present

## 2017-08-25 DIAGNOSIS — I4891 Unspecified atrial fibrillation: Secondary | ICD-10-CM | POA: Insufficient documentation

## 2017-08-25 DIAGNOSIS — Z87891 Personal history of nicotine dependence: Secondary | ICD-10-CM | POA: Diagnosis not present

## 2017-08-25 DIAGNOSIS — E1151 Type 2 diabetes mellitus with diabetic peripheral angiopathy without gangrene: Secondary | ICD-10-CM | POA: Insufficient documentation

## 2017-08-25 DIAGNOSIS — C9110 Chronic lymphocytic leukemia of B-cell type not having achieved remission: Secondary | ICD-10-CM

## 2017-08-25 LAB — CBC
HEMATOCRIT: 37.9 % (ref 35.0–47.0)
Hemoglobin: 12.2 g/dL (ref 12.0–16.0)
MCH: 26 pg (ref 26.0–34.0)
MCHC: 32.2 g/dL (ref 32.0–36.0)
MCV: 81 fL (ref 80.0–100.0)
Platelets: 241 10*3/uL (ref 150–440)
RBC: 4.69 MIL/uL (ref 3.80–5.20)
RDW: 15.6 % — AB (ref 11.5–14.5)
WBC: 34.6 10*3/uL — ABNORMAL HIGH (ref 3.6–11.0)

## 2017-09-08 ENCOUNTER — Ambulatory Visit (INDEPENDENT_AMBULATORY_CARE_PROVIDER_SITE_OTHER): Payer: Medicare Other

## 2017-09-08 ENCOUNTER — Ambulatory Visit (INDEPENDENT_AMBULATORY_CARE_PROVIDER_SITE_OTHER): Payer: Medicare Other | Admitting: Vascular Surgery

## 2017-09-08 ENCOUNTER — Encounter (INDEPENDENT_AMBULATORY_CARE_PROVIDER_SITE_OTHER): Payer: Self-pay | Admitting: Vascular Surgery

## 2017-09-08 VITALS — BP 156/76 | HR 91 | Resp 17 | Ht 63.0 in | Wt 163.0 lb

## 2017-09-08 DIAGNOSIS — I10 Essential (primary) hypertension: Secondary | ICD-10-CM

## 2017-09-08 DIAGNOSIS — E1149 Type 2 diabetes mellitus with other diabetic neurological complication: Secondary | ICD-10-CM

## 2017-09-08 DIAGNOSIS — I739 Peripheral vascular disease, unspecified: Secondary | ICD-10-CM | POA: Diagnosis not present

## 2017-09-08 DIAGNOSIS — E782 Mixed hyperlipidemia: Secondary | ICD-10-CM

## 2017-09-08 NOTE — Progress Notes (Signed)
Subjective:    Patient ID: [name], female    DOB: 1951-07-12, 65 y.o.   MRN: [id number] Chief Complaint  Patient presents with  . Follow-up    49mos. ABI   Patient presents for a 6 month peripheral artery disease follow-up. The patient denies any claudication like symptoms, rest pain or ulcers to her feet / toes. Patient denies any issues with her left lower extremity stump. She is walking with her prosthetic without any issue. The patient underwent a right lower extremity ABI which was notable for a triphasic femoral artery and biphasic popliteal and tibials. Right ankle brachial index to just moderate right lower extremity arterial occlusive disease. When compared to the previous ABI on 03/07/2017 there has been a decrease in the right ankle brachial index. Patient denies any fever, nausea or vomiting.   Review of Systems  Constitutional: Negative.   HENT: Negative.   Eyes: Negative.   Respiratory: Negative.   Cardiovascular: Negative.   Gastrointestinal: Negative.   Endocrine: Negative.   Genitourinary: Negative.   Musculoskeletal: Negative.   Skin: Negative.   Allergic/Immunologic: Negative.   Neurological: Negative.   Hematological: Negative.   Psychiatric/Behavioral: Negative.       Objective:   Physical Exam  Constitutional: She is oriented to person, place, and time. She appears well-developed. No distress.  HENT:  Head: Normocephalic and atraumatic.  Eyes: Pupils are equal, round, and reactive to light. Conjunctivae are normal.  Neck: Normal range of motion.  Cardiovascular: Normal rate, regular rhythm, normal heart sounds and intact distal pulses.   Pulses:      Radial pulses are 2+ on the right side, and 2+ on the left side.       Dorsalis pedis pulses are 1+ on the left side.       Posterior tibial pulses are 1+ on the left side.  Left lower extremity stump is healed. In prosthetic.  Pulmonary/Chest: Effort normal.  Musculoskeletal: Normal range of  motion. She exhibits no edema.  Neurological: She is alert and oriented to person, place, and time.  Skin: Skin is warm and dry. She is not diaphoretic.  Right lower extremity no ulcerations noted  Psychiatric: She has a normal mood and affect. Her behavior is normal. Judgment and thought content normal.  Vitals reviewed.  BP (!) 156/76 (BP Location: Right Arm)   Pulse 91   Resp 17   Ht 5\' 3"  (1.6 m)   Wt 163 lb (73.9 kg)   BMI 28.87 kg/m   Past Medical History:  Diagnosis Date  . Anemia   . Arthritis   . Atrial fibrillation ([place])   . Chicken pox   . Diabetic neuropathy ([place])   . DM type 2 (diabetes mellitus, type 2) ([place])   . History of gastric ulcer   . Hypertension   . Peripheral vascular disease [facility])    Social History   Social History  . Marital status: Divorced    Spouse name: N/A  . Number of children: N/A  . Years of education: N/A   Occupational History  . Not on file.   Social History Main Topics  . Smoking status: Former Smoker    Packs/day: 1.00    Years: 40.00    Types: Cigarettes    Quit date: 07/21/2009  . Smokeless tobacco: Never Used  . Alcohol use 0.6 oz/week    1 Glasses of wine per week     Comment: occas  . Drug use: No  . Sexual  activity: Not Currently   Other Topics Concern  . Not on file   Social History Narrative   ** Merged History Encounter **       Past Surgical History:  Procedure Laterality Date  . ABDOMINAL HYSTERECTOMY    . ADENOIDECTOMY     5 yrs  . AMPUTATION Left 08/17/2015   Procedure: AMPUTATION BELOW KNEE;  Surgeon: Algernon Huxley, MD;  Location: [facility] OR[facility];  Service: Vascular;  Laterality: Left;  . CARDIOVERSION    . CATARACT EXTRACTION    . CATARACT EXTRACTION W/PHACO Left 07/16/2015   Procedure: CATARACT EXTRACTION PHACO AND INTRAOCULAR LENS PLACEMENT (IOC);  Surgeon: Lyla Glassing, MD;  Location: [facility] OR[facility];  Service: Ophthalmology;  Laterality: Left;  [place]: 01:16.4   . ENDOVENOUS ABLATION SAPHENOUS VEIN W/ LASER      . FOOT SURGERY Right   . OOPHORECTOMY    . PERIPHERAL VASCULAR CATHETERIZATION Left 08/05/2015   Procedure: Lower Extremity Angiography;  Surgeon: Algernon Huxley, MD;  Location: [place] [facility];  Service: Cardiovascular;  Laterality: Left;  . PERIPHERAL VASCULAR CATHETERIZATION  08/05/2015   Procedure: Lower Extremity Intervention;  Surgeon: Algernon Huxley, MD;  Location: [place] [facility];  Service: Cardiovascular;;   Family History  Problem Relation Age of Onset  . Arthritis Mother   . Hyperlipidemia Mother   . Hypertension Mother   . Breast cancer Mother 56  . Diabetes Mother   . COPD Mother   . Heart disease Mother   . Endometriosis Mother   . Arthritis Father   . Hyperlipidemia Father   . Hypertension Father   . Diabetes Father   . Heart disease Father   . Arthritis Maternal Grandmother   . Stroke Paternal Grandmother   . Breast cancer Maternal Aunt     Allergies  Allergen Reactions  . Codeine Nausea And Vomiting  . Tape Rash      Assessment & Plan:  Patient presents for a 6 month peripheral artery disease follow-up. The patient denies any claudication like symptoms, rest pain or ulcers to her feet / toes. Patient denies any issues with her left lower extremity stump. She is walking with her prosthetic without any issue. The patient underwent a right lower extremity ABI which was notable for a triphasic femoral artery and biphasic popliteal and tibials. Right ankle brachial index to just moderate right lower extremity arterial occlusive disease. When compared to the previous ABI on 03/07/2017 there has been a decrease in the right ankle brachial index. Patient denies any fever, nausea or vomiting.  1. Peripheral vascular disease ([place]) - Stable Patient presents asymptomatic today Physical exam with decreased pedal pulses Decrease in right ABI when compared to previous No indication for intervention at this time as the patient is asymptomatic and there is no ischemia on  physical exam Patient to follow up in 6 months for close observation  - VAS [place] ABI WITH/WO TBI; Future - VAS [place] LOWER EXTREMITY ARTERIAL DUPLEX; Future  2. Essential hypertension - Stable Encouraged good control as its slows the progression of atherosclerotic disease  3. DM (diabetes mellitus), type 2 with neurological complications (HCC) - stable Encouraged good control as its slows the progression of atherosclerotic disease  4. Mixed hyperlipidemia - stable Encouraged good control as its slows the progression of atherosclerotic disease  Current Outpatient Prescriptions on File Prior to Visit  Medication Sig Dispense Refill  . acetaminophen (TYLENOL) 500 MG tablet Take 500 mg by mouth every 6 (six) hours as  needed.    Marland Kitchen aspirin EC 81 MG tablet Take 81 mg by mouth 2 (two) times daily.    Marland Kitchen atorvastatin (LIPITOR) 40 MG tablet Take 40 mg by mouth at bedtime.    . B Complex-C (B-COMPLEX WITH VITAMIN C) tablet Take 1 tablet by mouth daily.    . Biotin 2500 MCG CAPS Take 2,500 mcg by mouth.    . Calcium-Magnesium-Vitamin D (CALCIUM 500 PO) Take 500 mg by mouth 2 (two) times daily.    . Cholecalciferol (D3 ADULT PO) Take 1,000 Units by mouth once.    . diphenhydrAMINE (SOMINEX) 25 MG tablet Take 25 mg by mouth 2 (two) times daily.    . furosemide (LASIX) 20 MG tablet Take 1 tablet (20 mg total) by mouth daily as needed. 30 tablet 3  . gabapentin (NEURONTIN) 100 MG capsule TAKE 1 CAPSULE BY MOUTH THREE TIMES DAILY 90 capsule 1  . glimepiride (AMARYL) 2 MG tablet TAKE 1 TABLET BY MOUTH DAILY WITH BREAKFAST 90 tablet 0  . insulin NPH Human (HUMULIN N) 100 UNIT/ML injection Inject 0.06 mLs (6 Units total) into the skin 2 (two) times daily before a meal. 10 mL 11  . lisinopril (PRINIVIL,ZESTRIL) 2.5 MG tablet TAKE 1 TABLET EVERY DAY 90 tablet 1  . Mag Aspart-Potassium Aspart (RA POTASSIUM/MAGNESIUM PO) Take by mouth daily.    . Omega-3 Fatty Acids (FISH OIL) 1200 MG CAPS Take 300 mg by mouth 2  (two) times daily.     Marland Kitchen POTASSIUM BICARBONATE PO Take 1 tablet by mouth daily.    . sotalol (BETAPACE) 120 MG tablet TAKE ONE TABLET TWICE A DAY 180 tablet 0  . vitamin E 400 UNIT capsule Take 400 Units by mouth 2 (two) times daily.     No current facility-administered medications on file prior to visit.     There are no Patient Instructions on file for this visit. No Follow-up on file.   Janyia Guion A Avangeline Stockburger, PA-C

## 2017-11-17 ENCOUNTER — Ambulatory Visit: Payer: Self-pay | Admitting: Oncology

## 2017-11-17 ENCOUNTER — Other Ambulatory Visit: Payer: Self-pay

## 2017-11-21 ENCOUNTER — Other Ambulatory Visit: Payer: Self-pay | Admitting: Family Medicine

## 2017-11-23 ENCOUNTER — Other Ambulatory Visit: Payer: Self-pay

## 2017-11-23 MED ORDER — LISINOPRIL 2.5 MG PO TABS: 2.5000 mg | ORAL_TABLET | Freq: Every day | ORAL | 1 refills | Status: AC

## 2017-11-27 ENCOUNTER — Telehealth: Payer: Self-pay | Admitting: Family Medicine

## 2017-12-05 DEATH — deceased

## 2017-12-08 ENCOUNTER — Ambulatory Visit: Payer: Medicare Other | Admitting: Oncology

## 2017-12-08 ENCOUNTER — Other Ambulatory Visit: Payer: Medicare Other

## 2018-03-09 ENCOUNTER — Encounter (INDEPENDENT_AMBULATORY_CARE_PROVIDER_SITE_OTHER): Payer: Self-pay

## 2018-03-09 ENCOUNTER — Ambulatory Visit (INDEPENDENT_AMBULATORY_CARE_PROVIDER_SITE_OTHER): Payer: Self-pay | Admitting: Vascular Surgery

## 2018-03-09 ENCOUNTER — Encounter (INDEPENDENT_AMBULATORY_CARE_PROVIDER_SITE_OTHER): Payer: Medicare Other

## 2018-03-28 ENCOUNTER — Ambulatory Visit: Payer: Self-pay

## 2021-01-25 ENCOUNTER — Telehealth: Payer: Self-pay | Admitting: Cardiovascular Disease

## 2021-01-25 NOTE — Telephone Encounter (Signed)
3 attempts to schedule fu appt from recall list.   Deleting recall.
